# Patient Record
Sex: Female | Born: 1980 | Race: Black or African American | Hispanic: No | Marital: Single | Smoking: Former smoker
Health system: Southern US, Community
[De-identification: ages and names within clinical notes are randomized; demographics above are authoritative.]

## PROBLEM LIST (undated history)

## (undated) ENCOUNTER — Ambulatory Visit

## (undated) DIAGNOSIS — R0600 Dyspnea, unspecified: Secondary | ICD-10-CM

## (undated) DIAGNOSIS — F32A Depression, unspecified: Secondary | ICD-10-CM

## (undated) DIAGNOSIS — R87619 Unspecified abnormal cytological findings in specimens from cervix uteri: Secondary | ICD-10-CM

## (undated) DIAGNOSIS — F1910 Other psychoactive substance abuse, uncomplicated: Secondary | ICD-10-CM

## (undated) DIAGNOSIS — K219 Gastro-esophageal reflux disease without esophagitis: Secondary | ICD-10-CM

## (undated) DIAGNOSIS — M1990 Unspecified osteoarthritis, unspecified site: Secondary | ICD-10-CM

## (undated) DIAGNOSIS — E669 Obesity, unspecified: Secondary | ICD-10-CM

## (undated) DIAGNOSIS — D649 Anemia, unspecified: Secondary | ICD-10-CM

## (undated) DIAGNOSIS — F419 Anxiety disorder, unspecified: Secondary | ICD-10-CM

## (undated) DIAGNOSIS — IMO0002 Reserved for concepts with insufficient information to code with codable children: Secondary | ICD-10-CM

## (undated) HISTORY — PX: WISDOM TOOTH EXTRACTION: SHX21

## (undated) HISTORY — PX: TONSILLECTOMY AND ADENOIDECTOMY: SUR1326

## (undated) HISTORY — PX: TONSILLECTOMY: SUR1361

## (undated) HISTORY — PX: TONSILLECTOMY AND ADENOIDECTOMY: SHX28

## (undated) HISTORY — PX: FOOT SURGERY: SHX648

## (undated) HISTORY — DX: Reserved for concepts with insufficient information to code with codable children: IMO0002

## (undated) HISTORY — DX: Unspecified abnormal cytological findings in specimens from cervix uteri: R87.619

---
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
## 2000-05-11 ENCOUNTER — Encounter: Payer: Self-pay | Admitting: Emergency Medicine

## 2000-05-11 ENCOUNTER — Inpatient Hospital Stay (HOSPITAL_COMMUNITY): Admission: EM | Admit: 2000-05-11 | Discharge: 2000-05-12 | Payer: Self-pay | Admitting: Emergency Medicine

## 2000-05-22 ENCOUNTER — Encounter: Admission: RE | Admit: 2000-05-22 | Discharge: 2000-05-22 | Payer: Self-pay | Admitting: Hematology and Oncology

## 2000-11-01 ENCOUNTER — Emergency Department (HOSPITAL_COMMUNITY): Admission: EM | Admit: 2000-11-01 | Discharge: 2000-11-01 | Payer: Self-pay | Admitting: *Deleted

## 2001-09-07 ENCOUNTER — Emergency Department (HOSPITAL_COMMUNITY): Admission: EM | Admit: 2001-09-07 | Discharge: 2001-09-08 | Payer: Self-pay | Admitting: Emergency Medicine

## 2001-10-10 ENCOUNTER — Inpatient Hospital Stay (HOSPITAL_COMMUNITY): Admission: EM | Admit: 2001-10-10 | Discharge: 2001-10-12 | Payer: Self-pay | Admitting: Emergency Medicine

## 2001-10-10 ENCOUNTER — Encounter: Payer: Self-pay | Admitting: Emergency Medicine

## 2001-12-26 ENCOUNTER — Emergency Department (HOSPITAL_COMMUNITY): Admission: EM | Admit: 2001-12-26 | Discharge: 2001-12-26 | Payer: Self-pay | Admitting: Emergency Medicine

## 2003-05-04 ENCOUNTER — Encounter: Payer: Self-pay | Admitting: Emergency Medicine

## 2003-05-04 ENCOUNTER — Inpatient Hospital Stay (HOSPITAL_COMMUNITY): Admission: EM | Admit: 2003-05-04 | Discharge: 2003-05-05 | Payer: Self-pay | Admitting: Emergency Medicine

## 2003-08-22 ENCOUNTER — Emergency Department (HOSPITAL_COMMUNITY): Admission: EM | Admit: 2003-08-22 | Discharge: 2003-08-22 | Payer: Self-pay | Admitting: Emergency Medicine

## 2003-10-07 ENCOUNTER — Emergency Department (HOSPITAL_COMMUNITY): Admission: EM | Admit: 2003-10-07 | Discharge: 2003-10-07 | Payer: Self-pay | Admitting: Emergency Medicine

## 2004-01-23 ENCOUNTER — Other Ambulatory Visit: Admission: RE | Admit: 2004-01-23 | Discharge: 2004-01-23 | Payer: Self-pay | Admitting: Family Medicine

## 2004-01-25 ENCOUNTER — Emergency Department (HOSPITAL_COMMUNITY): Admission: EM | Admit: 2004-01-25 | Discharge: 2004-01-25 | Payer: Self-pay | Admitting: Emergency Medicine

## 2004-11-20 ENCOUNTER — Ambulatory Visit: Payer: Self-pay | Admitting: Family Medicine

## 2004-11-29 ENCOUNTER — Emergency Department (HOSPITAL_COMMUNITY): Admission: EM | Admit: 2004-11-29 | Discharge: 2004-11-29 | Payer: Self-pay | Admitting: Family Medicine

## 2005-01-10 ENCOUNTER — Inpatient Hospital Stay (HOSPITAL_COMMUNITY): Admission: RE | Admit: 2005-01-10 | Discharge: 2005-01-13 | Payer: Self-pay | Admitting: Psychiatry

## 2005-01-10 ENCOUNTER — Ambulatory Visit: Payer: Self-pay | Admitting: Psychiatry

## 2005-06-03 ENCOUNTER — Ambulatory Visit: Payer: Self-pay | Admitting: Certified Nurse Midwife

## 2005-06-03 ENCOUNTER — Inpatient Hospital Stay (HOSPITAL_COMMUNITY): Admission: AD | Admit: 2005-06-03 | Discharge: 2005-06-04 | Payer: Self-pay | Admitting: Obstetrics & Gynecology

## 2005-06-13 ENCOUNTER — Ambulatory Visit: Payer: Self-pay | Admitting: Certified Nurse Midwife

## 2005-06-13 ENCOUNTER — Inpatient Hospital Stay (HOSPITAL_COMMUNITY): Admission: AD | Admit: 2005-06-13 | Discharge: 2005-06-15 | Payer: Self-pay | Admitting: Family Medicine

## 2005-06-13 ENCOUNTER — Ambulatory Visit: Payer: Self-pay | Admitting: Family Medicine

## 2005-06-13 ENCOUNTER — Encounter (INDEPENDENT_AMBULATORY_CARE_PROVIDER_SITE_OTHER): Payer: Self-pay | Admitting: Specialist

## 2005-07-06 ENCOUNTER — Emergency Department (HOSPITAL_COMMUNITY): Admission: EM | Admit: 2005-07-06 | Discharge: 2005-07-06 | Payer: Self-pay | Admitting: Family Medicine

## 2006-11-20 ENCOUNTER — Emergency Department (HOSPITAL_COMMUNITY): Admission: EM | Admit: 2006-11-20 | Discharge: 2006-11-20 | Payer: Self-pay | Admitting: Family Medicine

## 2006-12-09 ENCOUNTER — Ambulatory Visit: Payer: Self-pay | Admitting: Family Medicine

## 2007-02-21 ENCOUNTER — Emergency Department (HOSPITAL_COMMUNITY): Admission: EM | Admit: 2007-02-21 | Discharge: 2007-02-21 | Payer: Self-pay | Admitting: Emergency Medicine

## 2007-05-24 ENCOUNTER — Emergency Department (HOSPITAL_COMMUNITY): Admission: EM | Admit: 2007-05-24 | Discharge: 2007-05-24 | Payer: Self-pay | Admitting: Emergency Medicine

## 2008-02-18 ENCOUNTER — Emergency Department (HOSPITAL_COMMUNITY): Admission: EM | Admit: 2008-02-18 | Discharge: 2008-02-18 | Payer: Self-pay | Admitting: Emergency Medicine

## 2008-04-12 ENCOUNTER — Ambulatory Visit: Payer: Self-pay | Admitting: Internal Medicine

## 2008-04-14 ENCOUNTER — Ambulatory Visit: Payer: Self-pay | Admitting: Internal Medicine

## 2008-04-22 ENCOUNTER — Encounter (INDEPENDENT_AMBULATORY_CARE_PROVIDER_SITE_OTHER): Payer: Self-pay | Admitting: Family Medicine

## 2008-04-22 ENCOUNTER — Ambulatory Visit: Payer: Self-pay | Admitting: Family Medicine

## 2008-04-22 LAB — CONVERTED CEMR LAB
Chlamydia, DNA Probe: NEGATIVE
GC Probe Amp, Genital: NEGATIVE

## 2008-05-23 ENCOUNTER — Emergency Department (HOSPITAL_COMMUNITY): Admission: EM | Admit: 2008-05-23 | Discharge: 2008-05-23 | Payer: Self-pay | Admitting: Family Medicine

## 2008-10-17 ENCOUNTER — Encounter (INDEPENDENT_AMBULATORY_CARE_PROVIDER_SITE_OTHER): Payer: Self-pay | Admitting: Family Medicine

## 2008-10-17 ENCOUNTER — Ambulatory Visit: Payer: Self-pay | Admitting: Internal Medicine

## 2008-10-17 LAB — CONVERTED CEMR LAB
ALT: 16 units/L (ref 0–35)
Albumin: 4.2 g/dL (ref 3.5–5.2)
Basophils Absolute: 0 10*3/uL (ref 0.0–0.1)
CO2: 22 meq/L (ref 19–32)
Calcium: 8.9 mg/dL (ref 8.4–10.5)
Chloride: 109 meq/L (ref 96–112)
Eosinophils Relative: 2 % (ref 0–5)
Glucose, Bld: 78 mg/dL (ref 70–99)
HCT: 40 % (ref 36.0–46.0)
Lymphocytes Relative: 33 % (ref 12–46)
Lymphs Abs: 2.3 10*3/uL (ref 0.7–4.0)
Neutro Abs: 4.2 10*3/uL (ref 1.7–7.7)
Platelets: 250 10*3/uL (ref 150–400)
Potassium: 3.8 meq/L (ref 3.5–5.3)
Sodium: 141 meq/L (ref 135–145)
Total Bilirubin: 0.6 mg/dL (ref 0.3–1.2)
Total Protein: 6.4 g/dL (ref 6.0–8.3)
WBC: 7.1 10*3/uL (ref 4.0–10.5)

## 2008-10-19 ENCOUNTER — Ambulatory Visit (HOSPITAL_COMMUNITY): Admission: RE | Admit: 2008-10-19 | Discharge: 2008-10-19 | Payer: Self-pay | Admitting: Family Medicine

## 2008-12-09 ENCOUNTER — Ambulatory Visit: Payer: Self-pay | Admitting: Family Medicine

## 2009-01-10 ENCOUNTER — Ambulatory Visit: Payer: Self-pay | Admitting: Family Medicine

## 2009-01-10 LAB — CONVERTED CEMR LAB
T3 Uptake Ratio: 35.4 % (ref 22.5–37.0)
TSH: 0.658 microintl units/mL (ref 0.350–4.50)

## 2009-03-02 ENCOUNTER — Ambulatory Visit: Payer: Self-pay | Admitting: Internal Medicine

## 2009-04-04 ENCOUNTER — Ambulatory Visit (HOSPITAL_COMMUNITY): Admission: RE | Admit: 2009-04-04 | Discharge: 2009-04-04 | Payer: Self-pay | Admitting: Family Medicine

## 2009-04-20 ENCOUNTER — Ambulatory Visit: Payer: Self-pay | Admitting: Family Medicine

## 2009-11-30 ENCOUNTER — Ambulatory Visit: Payer: Self-pay | Admitting: Family Medicine

## 2009-11-30 LAB — CONVERTED CEMR LAB
BUN: 12 mg/dL (ref 6–23)
Basophils Absolute: 0 10*3/uL (ref 0.0–0.1)
Chloride: 105 meq/L (ref 96–112)
Creatinine, Ser: 0.65 mg/dL (ref 0.40–1.20)
Eosinophils Absolute: 0.2 10*3/uL (ref 0.0–0.7)
Eosinophils Relative: 2 % (ref 0–5)
Free T4: 1.15 ng/dL (ref 0.80–1.80)
Glucose, Bld: 78 mg/dL (ref 70–99)
HCT: 40.1 % (ref 36.0–46.0)
Hemoglobin: 12.7 g/dL (ref 12.0–15.0)
Lymphocytes Relative: 38 % (ref 12–46)
MCV: 92 fL (ref 78.0–100.0)
Monocytes Absolute: 0.5 10*3/uL (ref 0.1–1.0)
Platelets: 261 10*3/uL (ref 150–400)
Potassium: 4.2 meq/L (ref 3.5–5.3)
RDW: 14.1 % (ref 11.5–15.5)
TSH: 1.414 microintl units/mL (ref 0.350–4.500)

## 2010-05-03 ENCOUNTER — Ambulatory Visit: Payer: Self-pay | Admitting: Family Medicine

## 2010-08-03 ENCOUNTER — Ambulatory Visit: Payer: Self-pay | Admitting: Family Medicine

## 2010-08-03 ENCOUNTER — Other Ambulatory Visit: Admission: RE | Admit: 2010-08-03 | Discharge: 2010-08-03 | Payer: Self-pay | Admitting: Family Medicine

## 2010-09-24 ENCOUNTER — Other Ambulatory Visit
Admission: RE | Admit: 2010-09-24 | Discharge: 2010-09-24 | Payer: Self-pay | Source: Home / Self Care | Admitting: Obstetrics and Gynecology

## 2010-09-24 ENCOUNTER — Ambulatory Visit: Payer: Self-pay | Admitting: Obstetrics and Gynecology

## 2010-10-15 ENCOUNTER — Ambulatory Visit: Payer: Self-pay | Admitting: Obstetrics & Gynecology

## 2011-01-02 ENCOUNTER — Emergency Department (HOSPITAL_COMMUNITY): Payer: Medicaid Other

## 2011-01-02 ENCOUNTER — Emergency Department (HOSPITAL_COMMUNITY)
Admission: EM | Admit: 2011-01-02 | Discharge: 2011-01-02 | Disposition: A | Discharge status: Home / Self Care | Payer: Medicaid Other | Attending: Emergency Medicine | Admitting: Emergency Medicine

## 2011-01-02 DIAGNOSIS — J45909 Unspecified asthma, uncomplicated: Secondary | ICD-10-CM | POA: Insufficient documentation

## 2011-01-02 DIAGNOSIS — R071 Chest pain on breathing: Secondary | ICD-10-CM | POA: Insufficient documentation

## 2011-01-02 DIAGNOSIS — R059 Cough, unspecified: Secondary | ICD-10-CM | POA: Insufficient documentation

## 2011-01-02 DIAGNOSIS — R0602 Shortness of breath: Secondary | ICD-10-CM | POA: Insufficient documentation

## 2011-01-02 DIAGNOSIS — R05 Cough: Secondary | ICD-10-CM | POA: Insufficient documentation

## 2011-01-29 LAB — POCT PREGNANCY, URINE: Preg Test, Ur: NEGATIVE

## 2011-04-05 NOTE — Discharge Summary (Signed)
NAME:  Melissa Guerra, Melissa Guerra                          ACCOUNT NO.:  [id number]   MEDICAL RECORD NO.:  [id number]                   PATIENT TYPE:  INP   LOCATION:  0456                                 FACILITY:  [facility]   PHYSICIAN:  Sherin Quarry, MD                   DATE OF BIRTH:  Jan 07, 1981   DATE OF ADMISSION:  05/04/2003  DATE OF DISCHARGE:  05/05/2003                                 DISCHARGE SUMMARY   HISTORY OF PRESENT ILLNESS:  Melissa Guerra is a 30 year old lady who has a  life-long history of asthma.  She was last hospitalized in November 2002.  The patient uses an albuterol inhaler on a p.r.n. basis, although she had  run out of this.  On the day prior to admission, she began to experience a  dry cough and sore throat.  By the night prior to admission, she was  experiencing extreme chest tightness, and had to sit up in a chair in order  to rest.  There have been no associated fevers or chest pain.  She vomited  twice.  She was admitted for treatment of this asthmatic exacerbation.  Of  note, is that the patient continues to smoke about 1/2 pack of cigarettes  per day.   PHYSICAL EXAMINATION:  HEENT:  Within normal limits.  CHEST:  Diffuse expiratory wheezes and scattered rhonchi.  CARDIOVASCULAR:  Normal S1 and S2, there were no murmurs, rubs, or gallops.  ABDOMEN:  Benign.  Normal bowel sounds.  There were no masses or tenderness.  No guarding or rebound.  NEUROLOGIC:  Normal.  EXTREMITIES:  Normal.   LABORATORY DATA:  Chest x-ray was consistent with finding of asthma, there  were no acute infiltrates.  BMET which showed a potassium of 2.1 initially.  Followup potassium later in the day was at 2.7.  The CBC was within normal  limits on admission.   HOSPITAL COURSE:  The patient was started on a regimen of Solu-Medrol 80 mg  IV q.12h., and an IV of D5 and 1/2 normal saline with 20 mEq of KCL at 125  cc per hour.  She was also given K-Dur 20 mEq t.i.d.  Nebulizer  treatments  with albuterol 2.5 mg were given q.4h.  She was placed on Zithromax and  Rocephin.  The patient's course was one of gradual improvement.  On May 05, 2003, although she continued to experience some wheezing and chest  congestion, it was felt that her problem could be managed with oral  treatment.  She indicated that she had Medicaid coverage and therefore would  be able to obtain medications.  Therefore, in the p.m. on May 05, 2003, the  patient was discharged.   DISCHARGE DIAGNOSES:  1. Acute exacerbation of asthma.  2. Chronic tobacco usage.  3. History of pneumonia.  4. Hypokalemia.   DISCHARGE MEDICATIONS:  1. Prior to discharge, the  patient's potassium was repeated and found to be     3.7.  The patient was given prednisone 40 mg daily x3, 30 mg daily x3, 20     x3, 10 x3, and then stop.  2. She was placed on albuterol meter dosed inhaler with Aerochamber 3 puffs     q.4h.  3. She was given potassium chloride 20 mEq daily.  4. She was placed on Zithromax 250 mg daily, and advised to take this     medication for five days.    FOLLOWUP:  She was advised to follow up with [facility] in 7 to 10 days.   The extreme importance of not smoking was repeatedly emphasized.                                               Sherin Quarry, MD    SY/MEDQ  D:  05/05/2003  T:  05/05/2003  Job:  [id number]   cc:   [facility]

## 2011-04-05 NOTE — Discharge Summary (Signed)
NAME:  Melissa Guerra, BACK                ACCOUNT NO.:  [id number]   MEDICAL RECORD NO.:  [id number]          PATIENT TYPE:  IPS   LOCATION:  0502                          [facility]ILITY[facility]  [facility]   PHYSICIAN:  Jeanice Lim, M.D. DATE OF BIRTH:  1981-05-17   DATE OF ADMISSION:  01/10/2005  DATE OF DISCHARGE:  01/13/2005                                 DISCHARGE SUMMARY   IDENTIFYING DATA:  30 year old single African-American female, voluntarily  admitted, presenting with a history of intentional overdose of Tylenol,  aspirin, Risperdal.  Reported overdose was stupid, regretting what she had  done, reported that she feared being evicted, unemployed, reported wanting  to go to sleep.  Considering terminating pregnancy.  [facility] admission, no prior suicide attempts.   ADMISSION MEDICATIONS:  None.   ALLERGIES:  CODEINE.   PHYSICAL EXAMINATION:  Within normal limits, neurologically nonfocal.   ROUTINE ADMISSION LABS:  Within normal limits.   MENTAL STATUS EXAM:  Alert, oriented, cooperative, fair eye contact.  Speech  clear, mood thankful that she did not die.  Mostly euthymic.  Thought  process goal directed, no psychosis or dangerous ideation, cognitively  intact.  Judgment and insight were fair.   ADMISSION DIAGNOSES:   AXIS I:  1.  Depressive disorder not otherwise specified.  2.  Rule out adjustment disorder with mixed emotions.   AXIS II:  Deferred.   AXIS III:  [redacted] weeks pregnant.   AXIS IV:  Moderate problems with housing, economic and other psychosocial  stressors.   AXIS V:  30/60-65.   HOSPITAL COURSE:  The patient was admitted and ordered routine p.r.n.  medications, underwent further monitoring, and was encouraged to participate  in individual, group and milieu therapy.  The patient was scheduled for  family session, participated in safety monitoring.  Family was contacted  regarding any safety concerns.  The patient reported improvement with  crisis  stabilization and resolution of depressive symptoms, feeling more hopeful,  having insight regarding the seriousness of her attempt and regretted,  showing remorse and was future oriented and was discharged with no risk  issues.  The patient was given medication education and discharged on:  Celexa 20 mg 1/2 q.a.m.   DISPOSITION:  To [facility] followup assessment and  for labs as well as psychiatric followup scheduled, and the patient was  discharged in improved condition with no risk issues.   DISCHARGE DIAGNOSES:   AXIS I:  1.  Depressive disorder not otherwise specified.  2.  Rule out adjustment disorder with mixed emotions.   AXIS II:  Deferred.   AXIS III:  [redacted] weeks pregnant.   AXIS IV:  Moderate problems with housing, economic and other psychosocial  stressors.   AXIS V:  Global assessment of function on discharge was 55-60.      JEM/MEDQ  D:  02/17/2005  T:  02/17/2005  Job:  [id number]

## 2011-04-05 NOTE — Discharge Summary (Signed)
[facility]  Patient:    Melissa Guerra, Melissa Guerra Visit Number: [id number] MRN: [id number]          Service Type: MED Location: [phone] Attending Physician:  Farley Ly Dictated by:   Jennette Kettle, M.D. Admit Date:  10/10/2001 Discharge Date: 10/12/2001   CC:         [address]   Discharge Summary  DISCHARGE DIAGNOSES: 1. Acute asthma exacerbation. 2. Upper respiratory tract infection. 3. History of tobacco use. 4. History of pneumonia in 2001.  DISCHARGE MEDICATIONS: 1. Albuterol 2.5 metered dose inhaler one to two puffs q.6h. p.r.n. asthma. 2. Flovent 44 mcg metered dose inhaler two puffs b.i.d. 3. Zithromax 250 mg p.o. q.d. to be taken for two more days, completing a    five-day course. 4. Claritin 10 mg p.o. q.d.  FOLLOW-UP:  The patient has a follow-up at [facility] on Friday, October 23, 2001, at 9:45 a.m.  Telephone number [phone].  ADMISSION HISTORY AND PHYSICAL:  Briefly, Melissa Guerra is a 30 year old African-American female with a lifelong history of asthma who had previously never been hospitalized.  The patient presents with a three-day history of shortness of breath and asthma.  The patient states that she was last seen at [facility] in March of 2002. The patient states that she has not been on her inhalers for the last month prior to admission.  The patient denied any fevers, chills, nightsweats, but did have two bouts of emesis the day before arrival.  The patient does smoke a half pack of cigarettes a day x 8 months now.  The patients peak flowmeters in the emergency room were 200 pretreatment and 250 post treatment with albuterol.  ADMISSION LABS:  The patient had a CBC on arrival with a white count of 7.4, hemoglobin 14.1 and platelets 299.  The patients electrolytes on admission were sodium 138, potassium 3.6, chloride 109, bicarb 22, glucose 127, BUN 11, creatinine 0.8, calcium 8.9.  HOSPITAL  COURSE:  #1 - ACUTE ASTHMA EXACERBATION:  The patient was admitted to the hospital and was placed on IV Solu-Medrol q.8h.  The patient was also started on q.3h. albuterol nebs and Atrovent nebs q.6h.  The patient was started on Azithromycin empirically for upper respiratory symptoms suggestive of bronchitis.  The patients peak flows improved while in the hospital to values of 350 pre- and post treatment.  The patient was discharged home on the day of October 12, 2001, with room air pulse oximetry readings 96 to 100%.  The patient was given Flovent and albuterol metered dose inhaler to take home. The patient was given a peak flowmeter to make home.  The patient will complete her two more days of Zithromax antibiotic therapy.  The patient will follow up with [facility] on October 23, 2001. The patient was advised to return to the emergency room or to [facility] triage if her asthma exacerbation returns.  #2 - TOBACCO USE:  The patient has an eight-month history of tobacco use. It was recommended to the patient that she stop smoking. The patient agrees to this plan.  #3 - SLIGHTLY ELEVATED GLUCOSE VALUES:  The patient was given steroids in the emergency room and was on IV Solu-Medrol which likely increased her glucose values.  The patient does have risk for diabetes in that she is African-American and is obese. The patient does have a family history of some diabetes as well.  This needs to be followed up further as an outpatient  at [facility].  DISCHARGE LABS:  The patient had an electrolyte panel drawn which revealed a sodium of 141, potassium 3.7, chloride 104, bicarb 24, glucose 138, BUN 9, creatinine 0.7, calcium 9.6. Dictated by:   Jennette Kettle, M.D. Attending Physician:  Farley Ly DD:  10/12/01 TD:  10/12/01 Job: [id number]

## 2011-04-22 ENCOUNTER — Ambulatory Visit: Payer: Self-pay | Admitting: Family Medicine

## 2011-05-06 ENCOUNTER — Ambulatory Visit: Payer: Self-pay | Admitting: Family Medicine

## 2011-05-16 ENCOUNTER — Ambulatory Visit (INDEPENDENT_AMBULATORY_CARE_PROVIDER_SITE_OTHER): Payer: Medicaid Other | Admitting: Obstetrics and Gynecology

## 2011-05-16 ENCOUNTER — Other Ambulatory Visit: Payer: Self-pay | Admitting: Obstetrics and Gynecology

## 2011-05-16 DIAGNOSIS — N870 Mild cervical dysplasia: Secondary | ICD-10-CM

## 2011-08-15 LAB — POCT URINALYSIS DIP (DEVICE)
Glucose, UA: NEGATIVE
Nitrite: NEGATIVE
Operator id: 235561
Urobilinogen, UA: 0.2

## 2011-08-15 LAB — POCT PREGNANCY, URINE: Preg Test, Ur: NEGATIVE

## 2011-09-03 ENCOUNTER — Other Ambulatory Visit (HOSPITAL_COMMUNITY)
Admission: RE | Admit: 2011-09-03 | Discharge: 2011-09-03 | Disposition: A | Discharge status: Home / Self Care | Payer: Medicaid Other | Source: Ambulatory Visit | Attending: Preventative Medicine | Admitting: Preventative Medicine

## 2011-09-03 ENCOUNTER — Other Ambulatory Visit: Payer: Self-pay | Admitting: Family Medicine

## 2011-09-03 DIAGNOSIS — Z01419 Encounter for gynecological examination (general) (routine) without abnormal findings: Secondary | ICD-10-CM | POA: Insufficient documentation

## 2011-09-03 LAB — POCT RAPID STREP A: Streptococcus, Group A Screen (Direct): NEGATIVE

## 2011-09-16 ENCOUNTER — Telehealth: Payer: Self-pay | Admitting: Obstetrics and Gynecology

## 2011-09-16 NOTE — Telephone Encounter (Signed)
Patient called with questions whether she should still come for her appointment in October 02, 2011 @ 1445 for a colposcopy. Per Dr. Shawnie Pons, yes, she does because her pap results are different from  from last year's biopsy result.  Patient agreed.

## 2011-10-02 ENCOUNTER — Encounter: Payer: Medicaid Other | Admitting: Physician Assistant

## 2011-11-02 ENCOUNTER — Encounter: Payer: Self-pay | Admitting: Emergency Medicine

## 2011-11-02 ENCOUNTER — Emergency Department (HOSPITAL_COMMUNITY)
Admission: EM | Admit: 2011-11-02 | Discharge: 2011-11-02 | Disposition: A | Discharge status: Home / Self Care | Payer: Medicaid Other | Source: Home / Self Care | Attending: Family Medicine | Admitting: Family Medicine

## 2011-11-02 DIAGNOSIS — J111 Influenza due to unidentified influenza virus with other respiratory manifestations: Secondary | ICD-10-CM

## 2011-11-02 LAB — POCT RAPID STREP A: Streptococcus, Group A Screen (Direct): NEGATIVE

## 2011-11-02 MED ORDER — MELOXICAM 7.5 MG PO TABS: 7.5000 mg | ORAL_TABLET | Freq: Every day | ORAL | Status: AC

## 2011-11-02 MED ORDER — BENZONATATE 200 MG PO CAPS: 200.0000 mg | ORAL_CAPSULE | Freq: Three times a day (TID) | ORAL | Status: AC | PRN

## 2011-11-02 NOTE — ED Notes (Signed)
Cough, runny nose, ears hurt, headache, eyes painful.  C/o nausea, no vomiting, no diarrhea.

## 2011-11-02 NOTE — ED Provider Notes (Signed)
History     CSN: [id number] Arrival date & time: 11/02/2011  4:34 PM   First MD Initiated Contact with Patient 11/02/11 1500      No chief complaint on file.   (Consider location/radiation/quality/duration/timing/severity/associated sxs/prior treatment) Patient is a 30 y.o. female presenting with flu symptoms. The history is provided by the patient.  Influenza This is a new problem. The current episode started more than 2 days ago. The problem occurs constantly. The problem has been gradually worsening. Associated symptoms include headaches and shortness of breath. Pertinent negatives include no chest pain and no abdominal pain. The symptoms are aggravated by exertion and coughing. The symptoms are relieved by nothing. The treatment provided no relief.    No past medical history on file.  No past surgical history on file.  No family history on file.  History  Substance Use Topics  . Smoking status: Not on file  . Smokeless tobacco: Not on file  . Alcohol Use: Not on file    OB History    No data available      Review of Systems  Constitutional: Positive for fever, chills, activity change and fatigue.  HENT: Positive for ear pain, congestion, sore throat, rhinorrhea, sneezing, neck pain and postnasal drip.   Eyes: Positive for pain.  Respiratory: Positive for cough and shortness of breath.   Cardiovascular: Negative for chest pain.  Gastrointestinal: Positive for nausea. Negative for abdominal pain.  Neurological: Positive for dizziness, weakness and headaches.    Allergies  Review of patient's allergies indicates not on file.  Home Medications  No current outpatient prescriptions on file.  BP 120/40  Pulse 77  Temp(Src) 98.6 F (37 C) (Oral)  Resp 18  SpO2 100%  Physical Exam  Constitutional: She is oriented to person, place, and time. She appears well-developed and well-nourished.       Obese black female  HENT:  Head: Normocephalic and atraumatic.    Right Ear: External ear normal.  Left Ear: External ear normal.  Eyes: Pupils are equal, round, and reactive to light.  Neck: Normal range of motion. Neck supple.  Cardiovascular: Normal rate, regular rhythm and normal heart sounds.   Pulmonary/Chest: Breath sounds normal. No respiratory distress. She has no wheezes.  Abdominal: Soft. Bowel sounds are normal. She exhibits no distension. There is no tenderness.  Musculoskeletal: Normal range of motion.  Lymphadenopathy:    She has cervical adenopathy.  Neurological: She is alert and oriented to person, place, and time.  Skin: Skin is warm.    ED Course  Procedures (including critical care time)  Labs Reviewed - No data to display No results found.   No diagnosis found.    MDM          Hassan Rowan, MD 11/02/11 [id number]

## 2011-11-21 ENCOUNTER — Encounter: Payer: Medicaid Other | Admitting: Family Medicine

## 2012-01-02 ENCOUNTER — Encounter: Payer: Self-pay | Admitting: Family Medicine

## 2012-01-02 ENCOUNTER — Other Ambulatory Visit (HOSPITAL_COMMUNITY)
Admission: RE | Admit: 2012-01-02 | Discharge: 2012-01-02 | Disposition: A | Discharge status: Home / Self Care | Payer: Medicaid Other | Source: Ambulatory Visit | Attending: Family Medicine | Admitting: Family Medicine

## 2012-01-02 ENCOUNTER — Ambulatory Visit (INDEPENDENT_AMBULATORY_CARE_PROVIDER_SITE_OTHER): Payer: Medicaid Other | Admitting: Family Medicine

## 2012-01-02 DIAGNOSIS — R87613 High grade squamous intraepithelial lesion on cytologic smear of cervix (HGSIL): Secondary | ICD-10-CM

## 2012-01-02 DIAGNOSIS — Z01812 Encounter for preprocedural laboratory examination: Secondary | ICD-10-CM

## 2012-01-02 DIAGNOSIS — R87619 Unspecified abnormal cytological findings in specimens from cervix uteri: Secondary | ICD-10-CM | POA: Insufficient documentation

## 2012-01-02 DIAGNOSIS — R85613 High grade squamous intraepithelial lesion on cytologic smear of anus (HGSIL): Secondary | ICD-10-CM

## 2012-01-02 DIAGNOSIS — N871 Moderate cervical dysplasia: Secondary | ICD-10-CM | POA: Insufficient documentation

## 2012-01-02 NOTE — Progress Notes (Signed)
Patient given informed consent, signed copy in the chart, time out was performed.  Placed in lithotomy position. Cervix viewed with speculum and colposcope after application of acetic acid.   Colposcopy adequate?  yes Acetowhite lesions?2, 7, 11 o'clock Punctation?no Mosaicism?  7, 11 o'clock Abnormal vasculature? no Biopsies?2, 7, 11 o'clock ECC?yes  Patient was given post procedure instructions.  She will return in 2 weeks for results.

## 2012-01-02 NOTE — Patient Instructions (Signed)
Colposcopy Care After Colposcopy is a procedure in which a special tool is used to magnify the surface of the cervix. A tissue sample (biopsy) may also be taken. This sample will be looked at for cervical cancer or other problems. After the test:  You may have some cramping.   Lie down for a few minutes if you feel lightheaded.    You may have some bleeding which should stop in a few days.  HOME CARE  Do not have sex or use tampons for 2 to 3 days or as told.   Only take medicine as told by your doctor.   Continue to take your birth control pills as usual.  Finding out the results of your test Ask when your test results will be ready. Make sure you get your test results. GET HELP RIGHT AWAY IF:  You are bleeding a lot or are passing blood clots.   You develop a fever of 102 F (38.9 C) or higher.   You have abnormal vaginal discharge.   You have cramps that do not go away with medicine.   You feel lightheaded, dizzy, or pass out (faint).  MAKE SURE YOU:   Understand these instructions.   Will watch your condition.   Will get help right away if you are not doing well or get worse.  Document Released: 04/22/2008 Document Revised: 07/17/2011 Document Reviewed: 04/22/2008 ExitCare Patient Information 2012 [employer]. 

## 2012-01-29 ENCOUNTER — Ambulatory Visit (INDEPENDENT_AMBULATORY_CARE_PROVIDER_SITE_OTHER): Payer: Medicaid Other | Admitting: Family Medicine

## 2012-01-29 ENCOUNTER — Encounter: Payer: Self-pay | Admitting: Family Medicine

## 2012-01-29 VITALS — BP 123/73 | HR 94 | Temp 98.0°F | Wt 265.9 lb

## 2012-01-29 DIAGNOSIS — N871 Moderate cervical dysplasia: Secondary | ICD-10-CM

## 2012-01-29 NOTE — Patient Instructions (Signed)
Loop Electrosurgical Excision Procedure Loop electrosurgical excision procedure (LEEP) is the removal of a portion of the lower part of the uterus (cervix). The procedure is done when there are significantly abnormal cervical cell changes. Abnormal cell changes of the cervix can lead to cancer if left in place and untreated.  The LEEP procedure itself typically only takes a few minutes. Often, it may be done in your caregiver's office. The procedure is considered safe for those who wish to get pregnant or are trying to get pregnant. Only under rare circumstances should this procedure be done if you are pregnant. LET YOUR CAREGIVER KNOW ABOUT:  Whether you are pregnant or late for your last menstrual period.   Allergies to foods or medicines.   All the medicines you are taking includingherbs, eyedrops, and over-the-counter medicines, and creams.   Use of steroids (by mouth or creams).   Previous problems with anesthetics or numbing medicine.   Previous gynecological surgery.   History of blood clots or bleeding problems.   Any recent or current vaginal infections (herpes, sexually transmitted infections).   Other health problems.  RISKS AND COMPLICATIONS  Bleeding.   Infection.   Injury to the vagina, bladder, or rectum.   Very rare obstruction of the cervical opening that causes problems during menstruation (cervical stenosis).  BEFORE THE PROCEDURE  Do not take aspirin or blood thinners (anticoagulants) for 1 week before the procedure, or as told by your caregiver.   Eat a light meal before the procedure.   Ask your caregiver about changing or stopping your regular medicines.   You may be given a pain reliever 1 or 2 hours before the procedure.  PROCEDURE   A tool (speculum) is placed in the vagina. This allows your caregiver to see the cervix.   An iodine stain is applied to the cervix to find the area of abnormal cells to be removed.   Medicine is injected to numb  the cervix (local anesthetic).    Electricity is passed through a thin wire loop which is then used to remove (cauterize) a small segment of the affected cervix.   Light electrocautery is used to seal any small blood vessels and prevent bleeding.   A paste may be applied to the cauterized area of the cervix to help prevent bleeding.   The tissue sample is sent to the lab. It is examined under the microscope.  AFTER THE PROCEDURE  Have someone drive you home.   You may have slight to moderate cramping.   You may notice a black vaginal discharge from the paste used on the cervix to prevent bleeding. This is normal.   Watch for excessive bleeding. This requires immediate medical care.   Ask when your test results will be ready. Make sure you get your test results.  Document Released: 01/25/2003 Document Revised: 10/24/2011 Document Reviewed: 04/16/2011 ExitCare Patient Information 2012 [employer]. 

## 2012-01-29 NOTE — Progress Notes (Signed)
  Subjective:    Patient ID: Melissa Guerra, female    DOB: 1981/02/13, 31 y.o.   MRN: [id number]  HPI Patient seen for follow up colposcopy and LEEP eval.  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    Review of Systems  Constitutional: Negative for fever and fatigue.  Gastrointestinal: Negative for abdominal pain.  Genitourinary: Negative for dysuria, vaginal bleeding, vaginal discharge and vaginal pain.       Objective:   Physical Exam  Constitutional: She is oriented to person, place, and time. She appears well-developed and well-nourished.  Neurological: She is alert and oriented to person, place, and time.  Skin: Skin is warm and dry.  Psychiatric: She has a normal mood and affect. Her behavior is normal. Judgment and thought content normal.      Assessment & Plan:  1.  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 Discussed results.  Recommended LEEP.  Discussed LEEP procedure in detail with risks vs benefits.  Will have patient watch LEEP video.  Will schedule next available LEEP.

## 2012-02-05 ENCOUNTER — Emergency Department (INDEPENDENT_AMBULATORY_CARE_PROVIDER_SITE_OTHER)
Admission: EM | Admit: 2012-02-05 | Discharge: 2012-02-05 | Disposition: A | Discharge status: Home / Self Care | Payer: Medicaid Other | Source: Home / Self Care | Attending: Family Medicine | Admitting: Family Medicine

## 2012-02-05 ENCOUNTER — Encounter (HOSPITAL_COMMUNITY): Payer: Self-pay

## 2012-02-05 DIAGNOSIS — T148 Other injury of unspecified body region: Secondary | ICD-10-CM

## 2012-02-05 DIAGNOSIS — T148XXA Other injury of unspecified body region, initial encounter: Secondary | ICD-10-CM

## 2012-02-05 DIAGNOSIS — W57XXXA Bitten or stung by nonvenomous insect and other nonvenomous arthropods, initial encounter: Secondary | ICD-10-CM

## 2012-02-05 MED ORDER — TRIAMCINOLONE ACETONIDE 0.1 % EX CREA: TOPICAL_CREAM | Freq: Two times a day (BID) | CUTANEOUS | Status: DC

## 2012-02-05 NOTE — ED Provider Notes (Signed)
Medical screening examination/treatment/procedure(s) were performed by non-physician practitioner and as supervising physician I was immediately available for consultation/collaboration.   MORENO-COLL,Dolorez Jeffrey; MD   Jaylise Peek Moreno-Coll, MD 02/05/12 2154 

## 2012-02-05 NOTE — ED Notes (Signed)
Patient in same sex relationship, not concerned about pregnancy

## 2012-02-05 NOTE — Discharge Instructions (Signed)
Insect Bite Mosquitoes, flies, fleas, bedbugs, and other insects can bite. Insect bites are different from insect stings. The bite may be red, puffy (swollen), and itchy for 2 to 4 days. Most bites get better on their own. HOME CARE   Do not scratch the bite.   Keep the bite clean and dry. Wash the bite with soap and water.   Put ice on the bite.   Put ice in a plastic bag.   Place a towel between your skin and the bag.   Leave the ice on for 20 minutes, 4 times a day. Do this for the first 2 to 3 days, or as told by your doctor.   You may use medicated lotions or creams to lessen itching as told by your doctor.   Only take medicines as told by your doctor.   If you are given medicines (antibiotics), take them as told. Finish them even if you start to feel better.  You may need a tetanus shot if:  You cannot remember when you had your last tetanus shot.   You have never had a tetanus shot.   The injury broke your skin.  If you need a tetanus shot and you choose not to have one, you may get tetanus. Sickness from tetanus can be serious. GET HELP RIGHT AWAY IF:   You have more pain, redness, or puffiness.   You see a red line on the skin coming from the bite.   You have a fever.   You have joint pain.   You have a headache or neck pain.   You feel weak.   You have a rash.   You have chest pain, or you are short of breath.   You have belly (abdominal) pain.   You feel sick to your stomach (nauseous) or throw up (vomit).   You feel very tired or sleepy.  MAKE SURE YOU:   Understand these instructions.   Will watch your condition.   Will get help right away if you are not doing well or get worse.  Document Released: 11/01/2000 Document Revised: 10/24/2011 Document Reviewed: 06/05/2011 ExitCare Patient Information 2012 [employer]. 

## 2012-02-05 NOTE — ED Notes (Signed)
Concerned about 3 areas on body that may be "bites'; patient has circled with ink, area on her let and right arms, and on breast

## 2012-02-05 NOTE — ED Provider Notes (Signed)
History     CSN: [id number]  Arrival date & time 02/05/12  1729   First MD Initiated Contact with Patient 02/05/12 1756      Chief Complaint  Patient presents with  . Rash    (Consider location/radiation/quality/duration/timing/severity/associated sxs/prior treatment) HPI Comments: Patient presents today with 3 red areas that she is concerned may be spider bites. She noticed these yesterday after she did some household cleaning and hanging some laundry out online to dry. She denies recently being in the woods or weeds. She states the areas are itchy and are not painful.   Past Medical History  Diagnosis Date  . Asthma   . Abnormal Pap smear     Past Surgical History  Procedure Date  . Tonsillectomy   . Tonsillectomy and adenoidectomy   . Foot surgery   . Tonsillectomy and adenoidectomy     History reviewed. No pertinent family history.  History  Substance Use Topics  . Smoking status: Former Smoker    Types: Cigarettes  . Smokeless tobacco: Never Used  . Alcohol Use: No    OB History    Grav Para Term Preterm Abortions TAB SAB Ect Mult Living   2 2              Review of Systems  Constitutional: Negative for fever and chills.  Musculoskeletal: Negative for myalgias and joint swelling.  Skin: Positive for rash.    Allergies  Codeine; Food; and Peanut-containing drug products  Home Medications   Current Outpatient Rx  Name Route Sig Dispense Refill  . ALBUTEROL SULFATE HFA IN Inhalation Inhale into the lungs.      [place] [employer] IN Inhalation Inhale into the lungs.      . CETIRIZINE HCL 10 MG PO TABS Oral Take 10 mg by mouth daily.      Marland Kitchen FLUTICASONE PROPIONATE 50 MCG/ACT NA SUSP Nasal Place 2 sprays into the nose daily.      . MELOXICAM 7.5 MG PO TABS Oral Take 1 tablet (7.5 mg total) by mouth daily. 30 tablet 2  . OMEPRAZOLE 10 MG PO CPDR Oral Take 10 mg by mouth 2 (two) times daily.    Marland Kitchen OVER THE COUNTER MEDICATION  Cough drop, cold medicine, pain med      . TRIAMCINOLONE ACETONIDE 0.1 % EX CREA Topical Apply topically 2 (two) times daily. 15 g 0    BP 168/77  Pulse 96  Temp(Src) 98.1 F (36.7 C) (Oral)  Resp 14  SpO2 100%  LMP 01/16/2012  Physical Exam  Nursing note and vitals reviewed. Constitutional: She appears well-developed and well-nourished. No distress.  Cardiovascular: Normal rate, regular rhythm and normal heart sounds.   Pulmonary/Chest: Effort normal and breath sounds normal. No respiratory distress.  Skin: Skin is warm and dry.       Dorsal aspect of the left forearm has a 6 x 7 cm erythematous macular area with central scabbed puncture site.. It is warm to touch. Right medial wrist 3 x 5 cm erythematous irregular macular area also with a central scabbed puncture site. Left superior chest 4 x 4 centimeter erythematous macular area, slightly indurated, without puncture site or scab.  Psychiatric: She has a normal mood and affect.    ED Course  Procedures (including critical care time)  Labs Reviewed - No data to display No results found.   1. Insect bites       MDM          Melody Comas,  PA 02/05/12 1908

## 2012-02-24 ENCOUNTER — Encounter: Payer: Medicaid Other | Admitting: Family Medicine

## 2012-03-19 ENCOUNTER — Encounter: Payer: Medicaid Other | Admitting: Family Medicine

## 2012-04-27 ENCOUNTER — Encounter: Payer: Self-pay | Admitting: Physician Assistant

## 2012-04-27 ENCOUNTER — Other Ambulatory Visit (HOSPITAL_COMMUNITY)
Admission: RE | Admit: 2012-04-27 | Discharge: 2012-04-27 | Disposition: A | Discharge status: Home / Self Care | Payer: Medicaid Other | Source: Ambulatory Visit | Attending: Family Medicine | Admitting: Family Medicine

## 2012-04-27 ENCOUNTER — Encounter: Payer: Self-pay | Admitting: Family Medicine

## 2012-04-27 ENCOUNTER — Ambulatory Visit (INDEPENDENT_AMBULATORY_CARE_PROVIDER_SITE_OTHER): Payer: Medicaid Other | Admitting: Family Medicine

## 2012-04-27 VITALS — BP 126/82 | HR 82 | Temp 99.0°F | Ht 64.0 in | Wt 261.4 lb

## 2012-04-27 DIAGNOSIS — D069 Carcinoma in situ of cervix, unspecified: Secondary | ICD-10-CM | POA: Insufficient documentation

## 2012-04-27 DIAGNOSIS — Z01812 Encounter for preprocedural laboratory examination: Secondary | ICD-10-CM

## 2012-04-27 DIAGNOSIS — N871 Moderate cervical dysplasia: Secondary | ICD-10-CM

## 2012-04-27 LAB — POCT PREGNANCY, URINE: Preg Test, Ur: NEGATIVE

## 2012-04-27 NOTE — Patient Instructions (Signed)
Loop Electrosurgical Excision Procedure Loop electrosurgical excision procedure (LEEP) is the removal of a portion of the lower part of the uterus (cervix). The procedure is done when there are significantly abnormal cervical cell changes. Abnormal cell changes of the cervix can lead to cancer if left in place and untreated.  The LEEP procedure itself typically only takes a few minutes. Often, it may be done in your caregiver's office. The procedure is considered safe for those who wish to get pregnant or are trying to get pregnant. Only under rare circumstances should this procedure be done if you are pregnant. LET YOUR CAREGIVER KNOW ABOUT:  Whether you are pregnant or late for your last menstrual period.   Allergies to foods or medicines.   All the medicines you are taking includingherbs, eyedrops, and over-the-counter medicines, and creams.   Use of steroids (by mouth or creams).   Previous problems with anesthetics or numbing medicine.   Previous gynecological surgery.   History of blood clots or bleeding problems.   Any recent or current vaginal infections (herpes, sexually transmitted infections).   Other health problems.  RISKS AND COMPLICATIONS  Bleeding.   Infection.   Injury to the vagina, bladder, or rectum.   Very rare obstruction of the cervical opening that causes problems during menstruation (cervical stenosis).  BEFORE THE PROCEDURE  Do not take aspirin or blood thinners (anticoagulants) for 1 week before the procedure, or as told by your caregiver.   Eat a light meal before the procedure.   Ask your caregiver about changing or stopping your regular medicines.   You may be given a pain reliever 1 or 2 hours before the procedure.  PROCEDURE   A tool (speculum) is placed in the vagina. This allows your caregiver to see the cervix.   An iodine stain is applied to the cervix to find the area of abnormal cells to be removed.   Medicine is injected to numb  the cervix (local anesthetic).    Electricity is passed through a thin wire loop which is then used to remove (cauterize) a small segment of the affected cervix.   Light electrocautery is used to seal any small blood vessels and prevent bleeding.   A paste may be applied to the cauterized area of the cervix to help prevent bleeding.   The tissue sample is sent to the lab. It is examined under the microscope.  AFTER THE PROCEDURE  Have someone drive you home.   You may have slight to moderate cramping.   You may notice a black vaginal discharge from the paste used on the cervix to prevent bleeding. This is normal.   Watch for excessive bleeding. This requires immediate medical care.   Ask when your test results will be ready. Make sure you get your test results.  Document Released: 01/25/2003 Document Revised: 10/24/2011 Document Reviewed: 04/16/2011 ExitCare Patient Information 2012 [employer]. 

## 2012-04-27 NOTE — Progress Notes (Signed)
Patient ID: Melissa Guerra, female   DOB: 23-Jan-1981, 31 y.o.   MRN: [id number]   LEEP PROCEDURE NOTE Pap smear and colposcopy reviewed.   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  Role of HPV,cervical dysplasia and need for close followup was empasized. Informed written consent was obtained. All questions were answered. Time out performed.  Procedure: The patient was placed in lithotomy position and the bivalved coated speculum was placed in the patient's vagina. A grounding pad placed on the patient. Lugol's solution was applied to the cervix and areas of decreased uptake were noted 11-2 o'clock.   Local anesthesia was administered via an intracervical block using 10cc of 2% Lidocaine with epinephrine. The suction was turned on and the Medium 1X Fisher Cone Biopsy Excisor on 50 Watts of cutting current was used to excise the area of decreased uptake and excise the entire transformation zone. Excellent hemostasis was achieved using roller ball coagulation set at 50 Watts coagulation current. Monsel's solution was then applied and the speculum was removed from the vagina. Specimens were sent to pathology. The patient tolerated the procedure well. Post-operative instructions given to patient, including instruction to seek medical attention for persistent bright red bleeding, fever, abdominal/pelvic pain, dysuria, nausea or vomiting. She was also told about the possibility of having copious yellow to black tinged discharge. She was counseled to avoid anything in the vagina (sex/douching/tampons) for 4-6 weeks. She has a  4 week post-operative check to review results and assess wound healing. Follow up in 6 months for repeat pap or as needed.

## 2012-05-06 ENCOUNTER — Telehealth: Payer: Self-pay | Admitting: *Deleted

## 2012-05-06 NOTE — Telephone Encounter (Signed)
Melissa Guerra called and left a message stating she had a LEEP last Monday and her question is that she knows she is supposed to have a mustard like discharge but is still having discharge running out that is yellowish, vinegarish and wants to know is it normal?

## 2012-05-06 NOTE — Telephone Encounter (Signed)
Called Azani back and left a voice mail message that we are returning your call, sorry that we have missed you - will call back again and you may call us again.

## 2012-05-06 NOTE — Telephone Encounter (Signed)
Called home number and left message also that we are returing her call and please call clinic back.

## 2012-05-14 NOTE — Telephone Encounter (Signed)
Called Qiana, she states since she left the message her cycle came on and is now just about finished, is having just whitish discharge, less than before. Discussed is normal to have a discharge for few weeks after LEEP, if has unusual discharge( green, etc) or fever or pain - call clinic or if over weekend go [facility]. Patient aware has followup appt 05/25/12.

## 2012-05-25 ENCOUNTER — Encounter: Payer: Self-pay | Admitting: Obstetrics & Gynecology

## 2012-05-25 ENCOUNTER — Ambulatory Visit (INDEPENDENT_AMBULATORY_CARE_PROVIDER_SITE_OTHER): Payer: Medicaid Other | Admitting: Obstetrics & Gynecology

## 2012-05-25 VITALS — BP 118/70 | HR 85 | Temp 98.2°F | Ht 64.0 in | Wt 264.5 lb

## 2012-05-25 DIAGNOSIS — N898 Other specified noninflammatory disorders of vagina: Secondary | ICD-10-CM

## 2012-05-25 NOTE — Patient Instructions (Signed)
Cervical Dysplasia Cervical dysplasia is a condition in which a woman has abnormal changes in the cells of her cervix. The cervix is the opening to the uterus (womb) between the vagina and the uterus. These changes are called cervical dysplasia and may be the first signs of cervical cancer. These cells can be taken from the cervix during a Pap test and then looked at under a microscope. With early detection, treatment, and close follow-up care, nearly all cervical dysplasia can be cured. If untreated, the mild to moderate stages of dysplasia often grow more severe.  RISK FACTORS  The following increase the risk for cervical dysplasia.  Having had a sexually transmitted disease, including:   Chlamydia.   Human papilloma virus (HPV).   Becoming sexually active before age 18.   Having had more than 1 sexual partner.   Not using protection, such as condoms, during sexual intercourse, especially with new sexual partners.   Having had cancer of the vagina or vulva.   Having a sexual partner whose previous partner had cancer of the cervix or cervical dysplasia.   Having a sexual partner who has or has had cancer of the penis.   Having a weakened immune system (HIV, organ transplant).   Being the daughter of a woman who took DES (diethylstilbestrol) during pregnancy.   A history of cervical cancer in a woman's sister or mother.   Smoking.   Having had an abnormal Pap test in the past.  SYMPTOMS  There are usually no symptoms. If there are symptoms, they may be vague such as:  Abnormal vaginal discharge.   Bleeding between periods or following intercourse.   Bleeding during menopause.   Pain on intercourse (dyspareunia).  DIAGNOSIS   The Pap test is the best way of detecting abnormalities of the cervix.   Biopsy (removing a piece of tissue to look at under the microscope) of the cervix when the Pap test is abnormal or when the Pap test is normal, but the cervix looks abnormal.    TREATMENT  Catching and treating the changes early with Pap tests can prevent cervical cancer.  Cryotherapy freezes the abnormal cells with a steel tip instrument.   A laser can be used to remove the abnormal cells.   Loop electrocautery excision procedure (LEEP). This procedure uses a heated electrical loop to remove a cone-like portion of the cervix, including the cervical canal.   For more serious cases of cervical dysplasia, the abnormal tissue may be removed surgically by:   A cone biopsy (by cold knife, laser or LEEP). A procedure in which a portion of the center of the cervix with the cervical canal is removed.   The uterus and cervix are removed (hysterectomy).  Your caregiver will advise you regarding the need and timing of Pap tests in your follow-up. Women who have been treated for dysplasia should be closely followed with pelvic exams and Pap tests. During the first year following treatment of cervical dysplasia, Pap tests should be done every 3 to 4 months. In the second year, the schedule is every 6 months, or as recommended by your caregiver. See your caregiver for new or worsening problems. HOME CARE INSTRUCTIONS   Follow the instructions and recommendations of your caregiver regarding medicines and follow-up appointments.   Only take over-the-counter or prescription medicines for pain or discomfort as directed by your caregiver.   Cramping and pelvic discomfort may follow cryotherapy. It is not abnormal to have watery discharge for several weeks after.     Laser, cone surgery, cryotherapy or LEEP can cause a bad smelling vaginal discharge. It may also cause vaginal bleeding for a couple weeks following the procedure. The discharge may be black from the paste used to control bleeding from the cone site. This is normal.   Do not use tampons, have sexual intercourse or douche until your caregiver says it is okay.  SEEK MEDICAL CARE IF:   You develop genital warts.   You  need a prescription for pain medicine following your treatment.  SEEK IMMEDIATE MEDICAL CARE IF:   Your bleeding is heavier than a normal menstrual period.   You develop bright red bleeding, especially if you have blood clots.   You have a fever.   You have increasing cramps or pain not relieved with medicine.   You are lightheaded, unusually weak, or have fainting spells.   You have abnormal vaginal discharge.   You develop abdominal pain.  PREVENTION   The surest way to prevent cervical dysplasia is to abstain from sexual intercourse.   Practice safe sex, use condoms and have only one sex partner who does not have other sex partners.   A Pap test is done to screen for cervical cancer.   The first Pap test should be done at age 21.   Between ages 21 and 29, Pap tests are repeated every 2 years.   Beginning at age 30, you are advised to have a Pap test every 3 years as long as your past 3 Pap tests have been normal.   Some women have medical problems that increase the chance of getting cervical cancer. Talk to your caregiver about these problems. It is especially important to talk to your caregiver if a new problem develops soon after your last Pap test. In these cases, your caregiver may recommend more frequent screening and Pap tests.   The above recommendations are the same for women who have or have not gotten the vaccine for HPV (Human Papillomavirus).   If you had a hysterectomy for a problem that was not a cancer or a condition that could lead to cancer, then you no longer need Pap tests. However, even if you no longer need a Pap test, a regular exam is a good idea to make sure no other problems are starting.    If you are between ages 65 and 70, and you have had normal Pap tests going back 10 years, you no longer need Pap tests. However, even if you no longer need a Pap test, a regular exam is a good idea to make sure no other problems are starting.    If you have  had past treatment for cervical cancer or a condition that could lead to cancer, you need Pap tests and screening for cancer for at least 20 years after your treatment.   If Pap tests have been discontinued, risk factors (such as a new sexual partner) need to be re-assessed to determine if screening should be resumed.   Some women may need screenings more often if they are at high risk for cervical cancer.   Your caregiver may do additional tests including:   Colposcopy. A procedure in which a special microscope magnifies the cells and allows the provider to closely examine the cervix, vagina, and vulva.   Biopsy. A small tissue sample is taken from the cervix, vagina or vulva. This is generally done in your caregivers office.   A cone biopsy (cold knife or laser). A large tissue sample is   taken from the cervix. This procedure is usually done in an operating room under a general anesthetic. The cone often removes all abnormal tissue and so may also complete the treatment.   LEEP, also removing a circular portion of the cervix and is done in a doctors office under a local anesthetic.   Now there is a vaccine, Gardasil, that was developed to prevent the HPV'S that can cause cancer of the cervix and genital warts. It is recommended for females ages 9 to 26. It should not be given to pregnant women until more is known about its effects on the fetus. Not all cancers of the cervix are caused by the HPV. Routine gynecology exams and Pap tests should continue as recommended by your caregiver.  Document Released: 11/04/2005 Document Revised: 10/24/2011 Document Reviewed: 10/26/2008 [employer] Patient Information 2012 [employer]. 

## 2012-05-25 NOTE — Progress Notes (Signed)
Subjective:     Patient ID: Melissa Guerra, female   DOB: [birth year], 31 y.o.   MRN: [id number]  HPIPatient's last menstrual period was 05/03/2012. G2P2 Patient had a LEEP performed by Dr. Adrian Blackwater on 04/23/2012. 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 She notes a vaginal discharge which may be getting better now. No abnormal bleeding.   Review of Systems Yellowish vaginal discharge no pain    Objective:   Physical Exam Filed Vitals:   05/25/12 1413  BP: 118/70  Pulse: 85  Temp: 98.2 F (36.8 C)  TempSrc: Oral  Height: 5\' 4"  (1.626 m)  Weight: 264 lb 8 oz (119.976 kg)   No acute distress normal affect    Pelvic external genitalia vagina and cervix appeared normal slight vaginal discharge was noted. Cervix is healing well after the procedure. Wet prep was obtained. 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    Plan:     We'll followup on a wet prep results and notify or if treatment is indicated. She'll return in 6 months for repeat Pap smear.  Melissa Guerra 05/25/2012 2:55 PM

## 2012-05-26 ENCOUNTER — Telehealth: Payer: Self-pay | Admitting: *Deleted

## 2012-05-26 NOTE — Telephone Encounter (Signed)
Message copied by Jill Side on Tue May 26, 2012  3:54 PM ------      Message from: Adam Phenix      Created: Tue May 26, 2012 10:39 AM       Negative wet prep for infection, please inform patient

## 2012-05-26 NOTE — Telephone Encounter (Signed)
Called pt and left message that her results from yesterday were normal. She does not have any infection which requires treatment. Please call back if she has any questions.

## 2012-12-24 ENCOUNTER — Emergency Department (HOSPITAL_COMMUNITY)
Admission: EM | Admit: 2012-12-24 | Discharge: 2012-12-24 | Disposition: A | Discharge status: Home / Self Care | Payer: Medicaid Other | Attending: Emergency Medicine | Admitting: Emergency Medicine

## 2012-12-24 ENCOUNTER — Encounter (HOSPITAL_COMMUNITY): Payer: Self-pay | Admitting: Emergency Medicine

## 2012-12-24 ENCOUNTER — Emergency Department (HOSPITAL_COMMUNITY): Payer: Medicaid Other

## 2012-12-24 DIAGNOSIS — R197 Diarrhea, unspecified: Secondary | ICD-10-CM | POA: Insufficient documentation

## 2012-12-24 DIAGNOSIS — Z87891 Personal history of nicotine dependence: Secondary | ICD-10-CM | POA: Insufficient documentation

## 2012-12-24 DIAGNOSIS — Z79899 Other long term (current) drug therapy: Secondary | ICD-10-CM | POA: Insufficient documentation

## 2012-12-24 DIAGNOSIS — R87619 Unspecified abnormal cytological findings in specimens from cervix uteri: Secondary | ICD-10-CM | POA: Insufficient documentation

## 2012-12-24 DIAGNOSIS — IMO0001 Reserved for inherently not codable concepts without codable children: Secondary | ICD-10-CM | POA: Insufficient documentation

## 2012-12-24 DIAGNOSIS — R05 Cough: Secondary | ICD-10-CM | POA: Insufficient documentation

## 2012-12-24 DIAGNOSIS — J45909 Unspecified asthma, uncomplicated: Secondary | ICD-10-CM | POA: Insufficient documentation

## 2012-12-24 DIAGNOSIS — R1110 Vomiting, unspecified: Secondary | ICD-10-CM | POA: Insufficient documentation

## 2012-12-24 DIAGNOSIS — R6883 Chills (without fever): Secondary | ICD-10-CM | POA: Insufficient documentation

## 2012-12-24 DIAGNOSIS — Z2089 Contact with and (suspected) exposure to other communicable diseases: Secondary | ICD-10-CM | POA: Insufficient documentation

## 2012-12-24 DIAGNOSIS — J029 Acute pharyngitis, unspecified: Secondary | ICD-10-CM | POA: Insufficient documentation

## 2012-12-24 DIAGNOSIS — J111 Influenza due to unidentified influenza virus with other respiratory manifestations: Secondary | ICD-10-CM

## 2012-12-24 DIAGNOSIS — R059 Cough, unspecified: Secondary | ICD-10-CM | POA: Insufficient documentation

## 2012-12-24 LAB — RAPID STREP SCREEN (MED CTR MEBANE ONLY): Streptococcus, Group A Screen (Direct): NEGATIVE

## 2012-12-24 MED ORDER — DEXAMETHASONE 6 MG PO TABS
12.0000 mg | ORAL_TABLET | Freq: Once | ORAL | Status: AC
  Administered 2012-12-24: 12 mg via ORAL
  Filled 2012-12-24: qty 2

## 2012-12-24 MED ORDER — BUDESONIDE-FORMOTEROL FUMARATE 80-4.5 MCG/ACT IN AERO: 2.0000 | INHALATION_SPRAY | Freq: Two times a day (BID) | RESPIRATORY_TRACT | Status: DC

## 2012-12-24 NOTE — ED Provider Notes (Signed)
History     CSN: [id number]  Arrival date & time 12/24/12  1016   First MD Initiated Contact with Patient 12/24/12 1020      No chief complaint on file.   (Consider location/radiation/quality/duration/timing/severity/associated sxs/prior treatment) The history is provided by the patient.   32 year old female has been sick for the last 3 days. She started with vomiting and diarrhea which resolved after 1 day. She then developed sore throat with associated cough productive of clear sputum which is occasionally blood-streaked. She denies fever but has had chills and sweats. She has had generalized bodyaches and she has some soreness in her chest which she relates to coughing. Pain is moderate to severe and she rates it at 7/10. She did have a sick contact in that a family member had complained of a sore throat several days before she started getting sick. She has been using her albuterol nebulizer with partial relief of her cough.  Past Medical History  Diagnosis Date  . Asthma   . Abnormal Pap smear     Past Surgical History  Procedure Date  . Tonsillectomy   . Tonsillectomy and adenoidectomy   . Foot surgery   . Tonsillectomy and adenoidectomy     No family history on file.  History  Substance Use Topics  . Smoking status: Former Smoker    Types: Cigarettes  . Smokeless tobacco: Never Used  . Alcohol Use: No    OB History    Grav Para Term Preterm Abortions TAB SAB Ect Mult Living   2 2              Review of Systems  All other systems reviewed and are negative.    Allergies  Codeine; Food; and Peanut-containing drug products  Home Medications   Current Outpatient Rx  Name  Route  Sig  Dispense  Refill  . ALBUTEROL SULFATE HFA IN   Inhalation   Inhale into the lungs.           [place] IN   Inhalation   Inhale into the lungs.           . CETIRIZINE HCL 10 MG PO TABS   Oral   Take 10 mg by mouth daily.           Marland Kitchen FLUTICASONE PROPIONATE 50  MCG/ACT NA SUSP   Nasal   Place 2 sprays into the nose daily.           Marland Kitchen OMEPRAZOLE 10 MG PO CPDR   Oral   Take 10 mg by mouth 2 (two) times daily.         Marland Kitchen OVER THE COUNTER MEDICATION      Cough drop, cold medicine, pain med          . TRIAMCINOLONE ACETONIDE 0.1 % EX CREA   Topical   Apply topically 2 (two) times daily.   15 g   0     BP 120/73  Pulse 91  Temp 98.1 F (36.7 C) (Oral)  Resp 20  SpO2 99%  LMP 12/21/2012  Physical Exam  Nursing note and vitals reviewed.  32 year old female, resting comfortably and in no acute distress. Vital signs are normal. Oxygen saturation is 99%, which is normal. Head is normocephalic and atraumatic. PERRLA, EOMI. Oropharynx is clear. Neck is nontender and supple without adenopathy or JVD. Back is nontender and there is no CVA tenderness. Lungs are clear without rales, wheezes, or rhonchi. Chest is nontender. Heart  has regular rate and rhythm without murmur. Abdomen is soft, flat, nontender without masses or hepatosplenomegaly and peristalsis is normoactive. Extremities have no cyanosis or edema, full range of motion is present. Skin is warm and dry without rash. Neurologic: Mental status is normal, cranial nerves are intact, there are no motor or sensory deficits.  ED Course  Procedures (including critical care time)  Results for orders placed during the hospital encounter of 12/24/12  RAPID STREP SCREEN      Component Value Range   Streptococcus, Group A Screen (Direct) NEGATIVE  NEGATIVE   Dg Chest 2 View  12/24/2012  *RADIOLOGY REPORT*  Clinical Data: Cough.  CHEST - 2 VIEW  Comparison: January 02, 2011.  Findings: Cardiomediastinal silhouette appears normal.  No acute pulmonary disease is noted.  Bony thorax is intact.  IMPRESSION: No acute cardiopulmonary abnormality seen.   Original Report Authenticated By: Lupita Raider.,  M.D.       1. Influenza-like illness       MDM  Influenza-like illness. Strep  screen will be obtained to evaluate for possible streptococcal infection and chest x-ray be obtained to rule out pneumonia. She is outside of the treatment window for antiviral treatment of influenza.  Strep screen and chest x-ray are unremarkable. She is allergic to codeine, so cannot receive hydrocodone for cough. She's a vice use over-the-counter remedies. She is mainly concerned about nasal congestion so is advised to use something with pseudoephedrine. Also advised to useas long as she does not use them for more than 3 days. She states she had run out of her steroid inhaler and review of past records shows that she had been on Symbicort, so a new prescription is given for Symbicort.   Dione Booze, MD 12/24/12 1147

## 2012-12-24 NOTE — ED Notes (Signed)
Pt from home has multiple complaints. C/o sore throat, left ear pain, congestion, productive cough and Diarrhea

## 2012-12-24 NOTE — ED Notes (Signed)
Patient transported to X-ray 

## 2013-04-28 ENCOUNTER — Encounter (HOSPITAL_COMMUNITY): Payer: Self-pay | Admitting: Emergency Medicine

## 2013-04-28 ENCOUNTER — Emergency Department (INDEPENDENT_AMBULATORY_CARE_PROVIDER_SITE_OTHER)
Admission: EM | Admit: 2013-04-28 | Discharge: 2013-04-28 | Disposition: A | Discharge status: Nursing Home (Includes ICF) | Payer: Medicaid Other | Source: Home / Self Care | Attending: Family Medicine | Admitting: Family Medicine

## 2013-04-28 ENCOUNTER — Encounter (HOSPITAL_COMMUNITY): Payer: Self-pay | Admitting: *Deleted

## 2013-04-28 ENCOUNTER — Emergency Department (HOSPITAL_COMMUNITY)
Admission: EM | Admit: 2013-04-28 | Discharge: 2013-04-28 | Disposition: A | Discharge status: Home / Self Care | Payer: Medicaid Other | Attending: Emergency Medicine | Admitting: Emergency Medicine

## 2013-04-28 ENCOUNTER — Emergency Department (HOSPITAL_COMMUNITY): Payer: Medicaid Other

## 2013-04-28 DIAGNOSIS — R200 Anesthesia of skin: Secondary | ICD-10-CM

## 2013-04-28 DIAGNOSIS — Z8742 Personal history of other diseases of the female genital tract: Secondary | ICD-10-CM | POA: Insufficient documentation

## 2013-04-28 DIAGNOSIS — R079 Chest pain, unspecified: Secondary | ICD-10-CM

## 2013-04-28 DIAGNOSIS — Z885 Allergy status to narcotic agent status: Secondary | ICD-10-CM | POA: Insufficient documentation

## 2013-04-28 DIAGNOSIS — J45909 Unspecified asthma, uncomplicated: Secondary | ICD-10-CM | POA: Insufficient documentation

## 2013-04-28 DIAGNOSIS — D649 Anemia, unspecified: Secondary | ICD-10-CM | POA: Insufficient documentation

## 2013-04-28 DIAGNOSIS — Z3202 Encounter for pregnancy test, result negative: Secondary | ICD-10-CM | POA: Insufficient documentation

## 2013-04-28 DIAGNOSIS — R209 Unspecified disturbances of skin sensation: Secondary | ICD-10-CM

## 2013-04-28 DIAGNOSIS — Z79899 Other long term (current) drug therapy: Secondary | ICD-10-CM | POA: Insufficient documentation

## 2013-04-28 DIAGNOSIS — Z87891 Personal history of nicotine dependence: Secondary | ICD-10-CM | POA: Insufficient documentation

## 2013-04-28 LAB — CBC
HCT: 32.6 % — ABNORMAL LOW (ref 36.0–46.0)
Hemoglobin: 10.3 g/dL — ABNORMAL LOW (ref 12.0–15.0)
MCV: 75.5 fL — ABNORMAL LOW (ref 78.0–100.0)
RBC: 4.32 MIL/uL (ref 3.87–5.11)
RDW: 16.8 % — ABNORMAL HIGH (ref 11.5–15.5)
WBC: 9.7 10*3/uL (ref 4.0–10.5)

## 2013-04-28 LAB — BASIC METABOLIC PANEL
BUN: 10 mg/dL (ref 6–23)
CO2: 21 mEq/L (ref 19–32)
Chloride: 104 mEq/L (ref 96–112)
GFR calc Af Amer: >90 mL/min (ref >90)
Glucose, Bld: 100 mg/dL — ABNORMAL HIGH (ref 70–99)
Potassium: 3.8 mEq/L (ref 3.5–5.1)

## 2013-04-28 LAB — POCT I-STAT TROPONIN I

## 2013-04-28 MED ORDER — FERROUS SULFATE 325 (65 FE) MG PO TABS: 325.0000 mg | ORAL_TABLET | Freq: Every day | ORAL | Status: DC

## 2013-04-28 NOTE — ED Notes (Signed)
EKG done at [facility] and shown to Dr. Blinda Leatherwood and no need to do another at this time.  Signed

## 2013-04-28 NOTE — ED Notes (Signed)
Pt transfer from [facility].  Pt states she was at work and started having chest pain and left hand went numb at 0400, but now travels up arm.  Pt feels like her chest is cramping

## 2013-04-28 NOTE — ED Notes (Signed)
Instructed to put on gown 

## 2013-04-28 NOTE — ED Provider Notes (Signed)
History     CSN: [id number]  Arrival date & time 04/28/13  1416   First MD Initiated Contact with Patient 04/28/13 1723      Chief Complaint  Patient presents with  . Chest Pain    (Consider location/radiation/quality/duration/timing/severity/associated sxs/prior treatment) HPI Patient presents with concern chest pain, left arm numbness. The pain has been present for greater than 3 weeks, is intermittent, occurring without clear precipitant. The pain is anterior, left-sided, sore.  Today the patient's pain and seemed to radiate to the left arm with numbness in her left hand. There is no concurrent lightheadedness, syncope, confusion, disorientation. There is no dyspnea, cough, fever, chills. Patient does not smoke. She was seen in urgent care, referred here for further evaluation and management. Past Medical History  Diagnosis Date  . Asthma   . Abnormal Pap smear     Past Surgical History  Procedure Laterality Date  . Tonsillectomy    . Tonsillectomy and adenoidectomy    . Foot surgery    . Tonsillectomy and adenoidectomy      No family history on file.  History  Substance Use Topics  . Smoking status: Former Smoker    Types: Cigarettes  . Smokeless tobacco: Never Used  . Alcohol Use: No    OB History   Grav Para Term Preterm Abortions TAB SAB Ect Mult Living   2 2              Review of Systems  Constitutional:       Per HPI, otherwise negative  HENT:       Per HPI, otherwise negative  Respiratory:       Per HPI, otherwise negative  Cardiovascular:       Per HPI, otherwise negative  Gastrointestinal: Negative for vomiting.  Endocrine:       Negative aside from HPI  Genitourinary:       Neg aside from HPI   Musculoskeletal:       Per HPI, otherwise negative  Skin: Negative.   Neurological: Negative for syncope.    Allergies  Codeine; Food; and Peanut-containing drug products  Home Medications   Current Outpatient Rx  Name  Route  Sig   Dispense  Refill  . albuterol (PROVENTIL HFA;VENTOLIN HFA) 108 (90 BASE) MCG/ACT inhaler   Inhalation   Inhale 2 puffs into the lungs every 6 (six) hours as needed for wheezing or shortness of breath.          . budesonide-formoterol (SYMBICORT) 80-4.5 MCG/ACT inhaler   Inhalation   Inhale 2 puffs into the lungs 2 (two) times daily.   1 Inhaler   0   . EPINEPHrine (EPIPEN JR) 0.15 MG/0.3 ML injection   Intramuscular   Inject 0.15 mg into the muscle daily as needed for anaphylaxis.           BP 127/77  Pulse 75  Temp(Src) 98.4 F (36.9 C) (Oral)  Resp 16  SpO2 100%  LMP 04/13/2013  Physical Exam  Nursing note and vitals reviewed. Constitutional: She is oriented to person, place, and time. She appears well-developed and well-nourished. No distress.  HENT:  Head: Normocephalic and atraumatic.  Eyes: Conjunctivae and EOM are normal.  Cardiovascular: Normal rate and regular rhythm.   Pulmonary/Chest: Effort normal and breath sounds normal. No stridor. No respiratory distress.  Mild diffuse tenderness to palpation throughout the superior left lateral chest wall no deformity, no erythema  Abdominal: She exhibits no distension.  Musculoskeletal: She exhibits no edema.  Neurological: She is alert and oriented to person, place, and time. No cranial nerve deficit.  Minimal subjective sensory change in the left upper extremity, with no objective loss of function or sensation  Skin: Skin is warm and dry.  Psychiatric: She has a normal mood and affect.    ED Course  Procedures (including critical care time)  Labs Reviewed  CBC - Abnormal; Notable for the following:    Hemoglobin 10.3 (*)    HCT 32.6 (*)    MCV 75.5 (*)    MCH 23.8 (*)    RDW 16.8 (*)    All other components within normal limits  BASIC METABOLIC PANEL - Abnormal; Notable for the following:    Glucose, Bld 100 (*)    All other components within normal limits  POCT PREGNANCY, URINE  POCT I-STAT TROPONIN I    Dg Chest 2 View  04/28/2013   *RADIOLOGY REPORT*  Clinical Data: Chest pain  CHEST - 2 VIEW  Comparison:  12/24/2012  Findings:  The heart size and mediastinal contours are within normal limits.  Both lungs are clear.  The visualized skeletal structures are unremarkable.  IMPRESSION: No active cardiopulmonary disease.   Original Report Authenticated By: Judie Petit. Miles Costain, M.D.     No diagnosis found.  Cardiac 85 sinus rhythm normal Pulse ox 100% room air normal    Date: 04/28/2013  Rate: 75  Rhythm: normal sinus rhythm with sinus arrhythmia  QRS Axis: normal  Intervals: normal  ST/T Wave abnormalities: normal  Conduction Disutrbances:none  Narrative Interpretation:   Old EKG Reviewed: none available  BORDERLINE  Initial labs demonstrate a notable anemia, with findings consistent with iron deficiency. She states that she was previously diagnosed with vitamin D deficiency, though she takes no current supplements the  MDM  This generally well-appearing female presents with concerns of newly chronic left-sided chest pain, new left hand dysesthesia.  On exam she is awake alert, in no distress, neurologically intact.  Given the absence of risk factors, there is low suspicion for ACS.  Additionally, with the lab results, there suspicion for anemia as contributory to her dysesthesia and pain, with consideration of inflammation as well.  We discussed initiation of iron supplements, primary care followup for both further evaluation and management of her anemia, as well as consideration of her musculoskeletal pain.          Gerhard Munch, MD 04/28/13 [phone]

## 2013-04-28 NOTE — ED Notes (Signed)
Pt discharged.Vital signs stable and GCS 15 

## 2013-04-28 NOTE — ED Provider Notes (Signed)
History     CSN: [id number]  Arrival date & time 04/28/13  1252   First MD Initiated Contact with Patient 04/28/13 1331      Chief Complaint  Patient presents with  . Chest Pain    (Consider location/radiation/quality/duration/timing/severity/associated sxs/prior treatment) Patient is a 32 y.o. female presenting with chest pain.  Chest Pain Associated symptoms: numbness and shortness of breath    This is a 32 year old female who was working in the middle of the night when she notice numbness and tingling in her left hand. She states that the numbness and tingling has moved up her arm to her shoulder. In addition she noticed some "pulling" pain under her left breast and some shortness of breath. She used her daughter's inhaler and this helped a little. She has not noticed any weakness of her left arm and has no other neurological symptoms. Past Medical History  Diagnosis Date  . Asthma   . Abnormal Pap smear     Past Surgical History  Procedure Laterality Date  . Tonsillectomy    . Tonsillectomy and adenoidectomy    . Foot surgery    . Tonsillectomy and adenoidectomy      No family history on file.  History  Substance Use Topics  . Smoking status: Former Smoker    Types: Cigarettes  . Smokeless tobacco: Never Used  . Alcohol Use: No    OB History   Grav Para Term Preterm Abortions TAB SAB Ect Mult Living   2 2              Review of Systems  Constitutional: Negative.   HENT: Negative.   Eyes: Negative.   Respiratory: Positive for shortness of breath.   Cardiovascular: Positive for chest pain.  Gastrointestinal: Negative.   Genitourinary: Negative.   Musculoskeletal: Negative.   Skin: Negative.   Neurological: Positive for light-headedness and numbness.  Hematological: Negative.   Psychiatric/Behavioral: Negative.     Allergies  Codeine; Food; and Peanut-containing drug products  Home Medications   Current Outpatient Rx  Name  Route  Sig  Dispense   Refill  . budesonide-formoterol (SYMBICORT) 80-4.5 MCG/ACT inhaler   Inhalation   Inhale 2 puffs into the lungs 2 (two) times daily.   1 Inhaler   0   . omeprazole (PRILOSEC) 20 MG capsule   Oral   Take 20 mg by mouth daily.         Marland Kitchen albuterol (PROVENTIL HFA;VENTOLIN HFA) 108 (90 BASE) MCG/ACT inhaler   Inhalation   Inhale 2 puffs into the lungs every 6 (six) hours as needed. For shortness of breath         . cetirizine (ZYRTEC) 10 MG tablet   Oral   Take 10 mg by mouth daily.             BP 145/77  Pulse 92  Temp(Src) 98.5 F (36.9 C) (Oral)  Resp 16  SpO2 99%  LMP 04/13/2013  Physical Exam  Constitutional: She is oriented to person, place, and time. She appears well-developed and well-nourished.  HENT:  Head: Normocephalic and atraumatic.  Eyes: Conjunctivae are normal. Pupils are equal, round, and reactive to light.  Neck: Normal range of motion. Neck supple.  Cardiovascular: Normal rate, regular rhythm and normal heart sounds.   Pulmonary/Chest: Effort normal and breath sounds normal.  Abdominal: Soft. Bowel sounds are normal.  Musculoskeletal: Normal range of motion.  Neurological: She is alert and oriented to person, place, and time. No cranial nerve  deficit.  Numbness to light touch noted all along left arm. No weakness noted.  Skin: Skin is warm and dry.  Psychiatric: She has a normal mood and affect. Her behavior is normal.    ED Course  Procedures (including critical care time)  Labs Reviewed - No data to display No results found.   1. Chest pain at rest   2. Left arm numbness       MDM  Will refer to the ER.        Calvert Cantor, MD 04/28/13 1347

## 2013-04-28 NOTE — ED Notes (Signed)
Patient reports chest pain, like pulling sensation and left arm numbness.  Initially numbness and tingling involved hand , now has moved up arm and into shoulder.  One episode of vomiting this am, no diaphoresis, intermittent sob.  .  Patient reports her treatments have been resting and trying to stay calm

## 2013-08-03 ENCOUNTER — Encounter (HOSPITAL_COMMUNITY): Payer: Self-pay | Admitting: *Deleted

## 2013-08-03 ENCOUNTER — Emergency Department (HOSPITAL_COMMUNITY): Payer: Medicaid Other

## 2013-08-03 ENCOUNTER — Emergency Department (HOSPITAL_COMMUNITY)
Admission: EM | Admit: 2013-08-03 | Discharge: 2013-08-03 | Disposition: A | Discharge status: Home / Self Care | Payer: Medicaid Other | Attending: Emergency Medicine | Admitting: Emergency Medicine

## 2013-08-03 DIAGNOSIS — B350 Tinea barbae and tinea capitis: Secondary | ICD-10-CM

## 2013-08-03 DIAGNOSIS — Z87891 Personal history of nicotine dependence: Secondary | ICD-10-CM | POA: Insufficient documentation

## 2013-08-03 DIAGNOSIS — S46912A Strain of unspecified muscle, fascia and tendon at shoulder and upper arm level, left arm, initial encounter: Secondary | ICD-10-CM

## 2013-08-03 DIAGNOSIS — Y92410 Unspecified street and highway as the place of occurrence of the external cause: Secondary | ICD-10-CM | POA: Insufficient documentation

## 2013-08-03 DIAGNOSIS — Z8719 Personal history of other diseases of the digestive system: Secondary | ICD-10-CM | POA: Insufficient documentation

## 2013-08-03 DIAGNOSIS — J45909 Unspecified asthma, uncomplicated: Secondary | ICD-10-CM | POA: Insufficient documentation

## 2013-08-03 DIAGNOSIS — E669 Obesity, unspecified: Secondary | ICD-10-CM | POA: Insufficient documentation

## 2013-08-03 DIAGNOSIS — R609 Edema, unspecified: Secondary | ICD-10-CM

## 2013-08-03 DIAGNOSIS — IMO0002 Reserved for concepts with insufficient information to code with codable children: Secondary | ICD-10-CM | POA: Insufficient documentation

## 2013-08-03 DIAGNOSIS — Z79899 Other long term (current) drug therapy: Secondary | ICD-10-CM | POA: Insufficient documentation

## 2013-08-03 DIAGNOSIS — R600 Localized edema: Secondary | ICD-10-CM

## 2013-08-03 DIAGNOSIS — Y9389 Activity, other specified: Secondary | ICD-10-CM | POA: Insufficient documentation

## 2013-08-03 HISTORY — DX: Obesity, unspecified: E66.9

## 2013-08-03 HISTORY — DX: Gastro-esophageal reflux disease without esophagitis: K21.9

## 2013-08-03 LAB — BASIC METABOLIC PANEL
CO2: 23 mEq/L (ref 19–32)
Calcium: 9 mg/dL (ref 8.4–10.5)
GFR calc non Af Amer: >90 mL/min (ref >90)
Potassium: 4 mEq/L (ref 3.5–5.1)
Sodium: 136 mEq/L (ref 135–145)

## 2013-08-03 MED ORDER — CYCLOBENZAPRINE HCL 10 MG PO TABS: 10.0000 mg | ORAL_TABLET | Freq: Three times a day (TID) | ORAL | Status: DC | PRN

## 2013-08-03 MED ORDER — KETOCONAZOLE 1 % EX SHAM: 1.0000 "application " | MEDICATED_SHAMPOO | CUTANEOUS | Status: DC

## 2013-08-03 MED ORDER — IBUPROFEN 800 MG PO TABS: 800.0000 mg | ORAL_TABLET | Freq: Three times a day (TID) | ORAL | Status: DC
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
## 2013-08-03 NOTE — ED Notes (Signed)
Pt c/o bilateral foot swelling x2 days, in addition to other complaints.
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
## 2013-08-03 NOTE — ED Notes (Signed)
Patient returned to adult ED  from pediatrics ED.Marland KitchenMarland Kitchen

## 2013-08-03 NOTE — ED Notes (Signed)
Patient registered then waiting in peds with daughter

## 2013-09-20 ENCOUNTER — Emergency Department (HOSPITAL_COMMUNITY)
Admission: EM | Admit: 2013-09-20 | Discharge: 2013-09-20 | Disposition: A | Discharge status: Home / Self Care | Payer: Medicaid Other | Source: Home / Self Care | Attending: Family Medicine | Admitting: Family Medicine

## 2013-09-20 ENCOUNTER — Encounter (HOSPITAL_COMMUNITY): Payer: Self-pay | Admitting: Emergency Medicine

## 2013-09-20 DIAGNOSIS — L250 Unspecified contact dermatitis due to cosmetics: Secondary | ICD-10-CM

## 2013-09-20 MED ORDER — FLUTICASONE PROPIONATE 0.05 % EX CREA: TOPICAL_CREAM | Freq: Two times a day (BID) | CUTANEOUS | Status: DC

## 2013-09-20 NOTE — ED Notes (Signed)
Facial skin reaction.  Onset Saturday.

## 2013-09-20 NOTE — ED Provider Notes (Signed)
CSN: [id number]     Arrival date & time 09/20/13  1422 History   First MD Initiated Contact with Patient 09/20/13 1507     No chief complaint on file.  (Consider location/radiation/quality/duration/timing/severity/associated sxs/prior Treatment) Patient is a 32 y.o. female presenting with rash. The history is provided by the patient.  Rash Pain severity:  No pain Onset quality:  Gradual Progression:  Unchanged Chronicity:  New Context comment:  Facial rash from makeup at Halloween, Relieved by:  None tried   Past Medical History  Diagnosis Date  . Asthma   . Abnormal Pap smear   . Obesity   . Acid reflux    Past Surgical History  Procedure Laterality Date  . Tonsillectomy    . Tonsillectomy and adenoidectomy    . Foot surgery    . Tonsillectomy and adenoidectomy     No family history on file. History  Substance Use Topics  . Smoking status: Former Smoker    Types: Cigarettes  . Smokeless tobacco: Never Used  . Alcohol Use: No   OB History   Grav Para Term Preterm Abortions TAB SAB Ect Mult Living   2 2             Review of Systems  Constitutional: Negative.   Musculoskeletal: Negative.   Skin: Positive for rash. Negative for wound.    Allergies  Codeine and Peanut-containing drug products  Home Medications   Current Outpatient Rx  Name  Route  Sig  Dispense  Refill  . albuterol (PROVENTIL HFA;VENTOLIN HFA) 108 (90 BASE) MCG/ACT inhaler   Inhalation   Inhale 2 puffs into the lungs every 6 (six) hours as needed for wheezing or shortness of breath.          . budesonide-formoterol (SYMBICORT) 80-4.5 MCG/ACT inhaler   Inhalation   Inhale 2 puffs into the lungs 2 (two) times daily.   1 Inhaler   0   . cyclobenzaprine (FLEXERIL) 10 MG tablet   Oral   Take 1 tablet (10 mg total) by mouth 3 (three) times daily as needed for muscle spasms.   30 tablet   0   . EPINEPHrine (EPIPEN JR) 0.15 MG/0.3 ML injection   Intramuscular   Inject 0.15 mg into the  muscle daily as needed for anaphylaxis.         . ferrous sulfate 325 (65 FE) MG tablet   Oral   Take 1 tablet (325 mg total) by mouth daily.   30 tablet   0   . fluticasone (CUTIVATE) 0.05 % cream   Topical   Apply topically 2 (two) times daily.   30 g   1   . ibuprofen (ADVIL,MOTRIN) 200 MG tablet   Oral   Take 400 mg by mouth every 6 (six) hours as needed for pain or headache.         . ibuprofen (ADVIL,MOTRIN) 800 MG tablet   Oral   Take 1 tablet (800 mg total) by mouth 3 (three) times daily.   21 tablet   0   . KETOCONAZOLE, TOPICAL, 1 % SHAM   Apply externally   Apply 1 application topically 3 (three) times a week.   125 mL   0    BP 132/84  Pulse 90  Temp(Src) 98.2 F (36.8 C) (Oral)  Resp 18  SpO2 99% Physical Exam  Nursing note and vitals reviewed. Constitutional: She is oriented to person, place, and time. She appears well-developed and well-nourished.  HENT:  Head:  Normocephalic.  Neck: Normal range of motion. Neck supple.  Lymphadenopathy:    She has no cervical adenopathy.  Neurological: She is alert and oriented to person, place, and time.  Skin: Skin is warm and dry. Rash noted.  Dry facial rash bilat r>l , pruritic.  Psychiatric: She has a normal mood and affect.    ED Course  Procedures (including critical care time) Labs Review Labs Reviewed - No data to display Imaging Review No results found.    MDM      Linna Hoff, MD 09/20/13 [phone]

## 2013-10-06 ENCOUNTER — Emergency Department (HOSPITAL_COMMUNITY)
Admission: EM | Admit: 2013-10-06 | Discharge: 2013-10-06 | Disposition: A | Discharge status: Home / Self Care | Payer: Medicaid Other | Attending: Emergency Medicine | Admitting: Emergency Medicine

## 2013-10-06 ENCOUNTER — Emergency Department (HOSPITAL_COMMUNITY): Payer: Medicaid Other

## 2013-10-06 ENCOUNTER — Encounter (HOSPITAL_COMMUNITY): Payer: Self-pay | Admitting: Emergency Medicine

## 2013-10-06 DIAGNOSIS — Z87891 Personal history of nicotine dependence: Secondary | ICD-10-CM | POA: Insufficient documentation

## 2013-10-06 DIAGNOSIS — R6883 Chills (without fever): Secondary | ICD-10-CM | POA: Insufficient documentation

## 2013-10-06 DIAGNOSIS — R112 Nausea with vomiting, unspecified: Secondary | ICD-10-CM | POA: Insufficient documentation

## 2013-10-06 DIAGNOSIS — B9789 Other viral agents as the cause of diseases classified elsewhere: Secondary | ICD-10-CM | POA: Insufficient documentation

## 2013-10-06 DIAGNOSIS — D649 Anemia, unspecified: Secondary | ICD-10-CM | POA: Insufficient documentation

## 2013-10-06 DIAGNOSIS — B349 Viral infection, unspecified: Secondary | ICD-10-CM

## 2013-10-06 DIAGNOSIS — Z3202 Encounter for pregnancy test, result negative: Secondary | ICD-10-CM | POA: Insufficient documentation

## 2013-10-06 DIAGNOSIS — IMO0002 Reserved for concepts with insufficient information to code with codable children: Secondary | ICD-10-CM | POA: Insufficient documentation

## 2013-10-06 DIAGNOSIS — J45909 Unspecified asthma, uncomplicated: Secondary | ICD-10-CM | POA: Insufficient documentation

## 2013-10-06 DIAGNOSIS — Z79899 Other long term (current) drug therapy: Secondary | ICD-10-CM | POA: Insufficient documentation

## 2013-10-06 DIAGNOSIS — Z8719 Personal history of other diseases of the digestive system: Secondary | ICD-10-CM | POA: Insufficient documentation

## 2013-10-06 DIAGNOSIS — E669 Obesity, unspecified: Secondary | ICD-10-CM | POA: Insufficient documentation

## 2013-10-06 LAB — COMPREHENSIVE METABOLIC PANEL
AST: 30 U/L (ref 0–37)
Alkaline Phosphatase: 78 U/L (ref 39–117)
BUN: 9 mg/dL (ref 6–23)
CO2: 21 mEq/L (ref 19–32)
Calcium: 9.1 mg/dL (ref 8.4–10.5)
Chloride: 105 mEq/L (ref 96–112)
Creatinine, Ser: 0.74 mg/dL (ref 0.50–1.10)
GFR calc non Af Amer: >90 mL/min (ref >90)
Glucose, Bld: 94 mg/dL (ref 70–99)
Total Bilirubin: 0.5 mg/dL (ref 0.3–1.2)
Total Protein: 6.7 g/dL (ref 6.0–8.3)

## 2013-10-06 LAB — URINALYSIS, ROUTINE W REFLEX MICROSCOPIC
Bilirubin Urine: NEGATIVE
Ketones, ur: NEGATIVE mg/dL
Leukocytes, UA: NEGATIVE
Nitrite: NEGATIVE
Protein, ur: NEGATIVE mg/dL
pH: 6.5 (ref 5.0–8.0)

## 2013-10-06 LAB — CBC WITH DIFFERENTIAL/PLATELET
Basophils Absolute: 0 10*3/uL (ref 0.0–0.1)
Eosinophils Absolute: 0.3 10*3/uL (ref 0.0–0.7)
HCT: 35.8 % — ABNORMAL LOW (ref 36.0–46.0)
Hemoglobin: 12.1 g/dL (ref 12.0–15.0)
Lymphocytes Relative: 34 % (ref 12–46)
Lymphs Abs: 3 10*3/uL (ref 0.7–4.0)
Monocytes Absolute: 0.7 10*3/uL (ref 0.1–1.0)
Monocytes Relative: 8 % (ref 3–12)
Neutro Abs: 4.9 10*3/uL (ref 1.7–7.7)
Platelets: 285 10*3/uL (ref 150–400)
RBC: 4.32 MIL/uL (ref 3.87–5.11)
WBC: 8.9 10*3/uL (ref 4.0–10.5)

## 2013-10-06 LAB — PRO B NATRIURETIC PEPTIDE: Pro B Natriuretic peptide (BNP): 171.9 pg/mL — ABNORMAL HIGH (ref 0–125)

## 2013-10-06 NOTE — ED Notes (Signed)
Patient transported to X-ray 

## 2013-10-06 NOTE — ED Notes (Signed)
Bed: LK44 Expected date:  Expected time:  Means of arrival:  Comments: EMS/32 yo feeling weak-hx decreased iron-off meds

## 2013-10-06 NOTE — ED Notes (Signed)
Per EMS report: pt from home: pt c/o of generalized weakness that has become progressively worse over the past 2 days. Pt also reports some hot and cold flashes. Pt reports vomiting x 1 tonight at around 22:30.  Hx of anemia and iron deficieny.  Pt has not been taking iron pills for the past month. Pt a/o x 4.  Skin warm and dry.  Pt has some redness on her cheeks but pt reports an allergic reaction to halloween make up.

## 2013-10-06 NOTE — ED Provider Notes (Signed)
CSN: [id number]     Arrival date & time 10/06/13  0132 History   First MD Initiated Contact with Patient 10/06/13 0203     Chief Complaint  Patient presents with  . Weakness  . Nausea  . Emesis   (Consider location/radiation/quality/duration/timing/severity/associated sxs/prior Treatment) HPI Patient presents with 2 days of increased fatigue, generalized weakness and cough. She states that she has had a cough productive of white phlegm. She's had increased chills without fever. She denies any abdominal pain, chest pain, nausea, diarrhea. She states she has a history of anemia has not been taking her iron as previously prescribed. Denies any bloody or melanotic stools. Past Medical History  Diagnosis Date  . Asthma   . Abnormal Pap smear   . Obesity   . Acid reflux    Past Surgical History  Procedure Laterality Date  . Tonsillectomy    . Tonsillectomy and adenoidectomy    . Foot surgery    . Tonsillectomy and adenoidectomy    . Wisdom tooth extraction     No family history on file. History  Substance Use Topics  . Smoking status: Former Smoker    Types: Cigarettes  . Smokeless tobacco: Never Used  . Alcohol Use: No   OB History   Grav Para Term Preterm Abortions TAB SAB Ect Mult Living   2 2             Review of Systems  Constitutional: Positive for fatigue. Negative for fever and chills.  HENT: Negative for sinus pressure and sore throat.   Respiratory: Positive for cough. Negative for shortness of breath.   Cardiovascular: Negative for chest pain, palpitations and leg swelling.  Gastrointestinal: Negative for nausea, vomiting, abdominal pain, diarrhea and constipation.  Genitourinary: Negative for dysuria, flank pain, vaginal bleeding and vaginal discharge.  Musculoskeletal: Negative for back pain and myalgias.  Skin: Negative for rash and wound.  Neurological: Positive for weakness (generalized). Negative for dizziness, numbness and headaches.  All other systems  reviewed and are negative.    Allergies  Codeine and Peanut-containing drug products  Home Medications   Current Outpatient Rx  Name  Route  Sig  Dispense  Refill  . albuterol (PROVENTIL HFA;VENTOLIN HFA) 108 (90 BASE) MCG/ACT inhaler   Inhalation   Inhale 2 puffs into the lungs every 6 (six) hours as needed for wheezing or shortness of breath.          . budesonide-formoterol (SYMBICORT) 80-4.5 MCG/ACT inhaler   Inhalation   Inhale 2 puffs into the lungs 2 (two) times daily.   1 Inhaler   0   . cyclobenzaprine (FLEXERIL) 10 MG tablet   Oral   Take 1 tablet (10 mg total) by mouth 3 (three) times daily as needed for muscle spasms.   30 tablet   0   . EPINEPHrine (EPIPEN JR) 0.15 MG/0.3 ML injection   Intramuscular   Inject 0.15 mg into the muscle daily as needed for anaphylaxis.         . ferrous sulfate 325 (65 FE) MG tablet   Oral   Take 1 tablet (325 mg total) by mouth daily.   30 tablet   0   . fluticasone (CUTIVATE) 0.05 % cream   Topical   Apply topically 2 (two) times daily.   30 g   1   . ibuprofen (ADVIL,MOTRIN) 200 MG tablet   Oral   Take 400 mg by mouth every 6 (six) hours as needed for pain or headache.         Marland Kitchen  ibuprofen (ADVIL,MOTRIN) 800 MG tablet   Oral   Take 1 tablet (800 mg total) by mouth 3 (three) times daily.   21 tablet   0   . KETOCONAZOLE, TOPICAL, 1 % SHAM   Apply externally   Apply 1 application topically 3 (three) times a week.   125 mL   0    BP 114/55  Pulse 77  Temp(Src) 97.4 F (36.3 C) (Oral)  Resp 18  SpO2 100%  LMP 09/17/2013 Physical Exam  Nursing note and vitals reviewed. Constitutional: She is oriented to person, place, and time. She appears well-developed and well-nourished. No distress.  HENT:  Head: Normocephalic and atraumatic.  Mouth/Throat: Oropharynx is clear and moist.  Eyes: EOM are normal. Pupils are equal, round, and reactive to light.  Neck: Normal range of motion. Neck supple.   Cardiovascular: Normal rate and regular rhythm.   Pulmonary/Chest: Effort normal and breath sounds normal. No respiratory distress. She has no wheezes. She has no rales. She exhibits no tenderness.  Abdominal: Soft. Bowel sounds are normal. She exhibits no distension and no mass. There is tenderness (mild epigastric tenderness.). There is no rebound and no guarding.  Musculoskeletal: Normal range of motion. She exhibits no edema and no tenderness.  No CVA tenderness. Mild bilateral pedal edema with 1+ pitting. No calf tenderness  Neurological: She is alert and oriented to person, place, and time.  Moves all extremities without deficit. Sensation grossly intact.  Skin: Skin is warm and dry. No rash noted. No erythema.  Psychiatric: She has a normal mood and affect. Her behavior is normal.    ED Course  Procedures (including critical care time) Labs Review Labs Reviewed  CBC WITH DIFFERENTIAL  COMPREHENSIVE METABOLIC PANEL  LIPASE, BLOOD  PRO B NATRIURETIC PEPTIDE  URINALYSIS, ROUTINE W REFLEX MICROSCOPIC  PREGNANCY, URINE   Imaging Review No results found.  EKG Interpretation   None       MDM   Patient continues to be well-appearing in emergency department. Her vital signs and laboratory workup are normal. Her chest x-ray shows no pneumonia. The patient likely has a viral infection. We'll discharge home. Patient given return precautions.   Loren Racer, MD 10/06/13 0400

## 2014-01-09 ENCOUNTER — Emergency Department (HOSPITAL_COMMUNITY): Payer: Medicaid Other

## 2014-01-09 ENCOUNTER — Emergency Department (HOSPITAL_COMMUNITY)
Admission: EM | Admit: 2014-01-09 | Discharge: 2014-01-09 | Disposition: A | Discharge status: Home / Self Care | Payer: Medicaid Other | Attending: Emergency Medicine | Admitting: Emergency Medicine

## 2014-01-09 ENCOUNTER — Encounter (HOSPITAL_COMMUNITY): Payer: Self-pay | Admitting: Emergency Medicine

## 2014-01-09 DIAGNOSIS — R0789 Other chest pain: Secondary | ICD-10-CM

## 2014-01-09 DIAGNOSIS — Z79899 Other long term (current) drug therapy: Secondary | ICD-10-CM | POA: Insufficient documentation

## 2014-01-09 DIAGNOSIS — Z87891 Personal history of nicotine dependence: Secondary | ICD-10-CM | POA: Insufficient documentation

## 2014-01-09 DIAGNOSIS — K219 Gastro-esophageal reflux disease without esophagitis: Secondary | ICD-10-CM | POA: Insufficient documentation

## 2014-01-09 DIAGNOSIS — J45909 Unspecified asthma, uncomplicated: Secondary | ICD-10-CM | POA: Insufficient documentation

## 2014-01-09 DIAGNOSIS — R609 Edema, unspecified: Secondary | ICD-10-CM | POA: Insufficient documentation

## 2014-01-09 DIAGNOSIS — J329 Chronic sinusitis, unspecified: Secondary | ICD-10-CM | POA: Insufficient documentation

## 2014-01-09 DIAGNOSIS — R072 Precordial pain: Secondary | ICD-10-CM | POA: Insufficient documentation

## 2014-01-09 DIAGNOSIS — R110 Nausea: Secondary | ICD-10-CM | POA: Insufficient documentation

## 2014-01-09 DIAGNOSIS — E669 Obesity, unspecified: Secondary | ICD-10-CM | POA: Insufficient documentation

## 2014-01-09 DIAGNOSIS — J029 Acute pharyngitis, unspecified: Secondary | ICD-10-CM | POA: Insufficient documentation

## 2014-01-09 LAB — CBC WITH DIFFERENTIAL/PLATELET
BASOS PCT: 0 % (ref 0–1)
Basophils Absolute: 0 10*3/uL (ref 0.0–0.1)
Eosinophils Absolute: 0.2 10*3/uL (ref 0.0–0.7)
Eosinophils Relative: 2 % (ref 0–5)
HEMATOCRIT: 36.6 % (ref 36.0–46.0)
Hemoglobin: 11.8 g/dL — ABNORMAL LOW (ref 12.0–15.0)
LYMPHS PCT: 37 % (ref 12–46)
Lymphs Abs: 2.9 10*3/uL (ref 0.7–4.0)
MCH: 27.3 pg (ref 26.0–34.0)
MCHC: 32.2 g/dL (ref 30.0–36.0)
MCV: 84.7 fL (ref 78.0–100.0)
Monocytes Absolute: 0.4 10*3/uL (ref 0.1–1.0)
Monocytes Relative: 6 % (ref 3–12)
NEUTROS ABS: 4.3 10*3/uL (ref 1.7–7.7)
Neutrophils Relative %: 55 % (ref 43–77)
PLATELETS: 281 10*3/uL (ref 150–400)
RBC: 4.32 MIL/uL (ref 3.87–5.11)
RDW: 15 % (ref 11.5–15.5)
WBC: 7.8 10*3/uL (ref 4.0–10.5)

## 2014-01-09 LAB — COMPREHENSIVE METABOLIC PANEL
ALK PHOS: 82 U/L (ref 39–117)
ALT: 12 U/L (ref 0–35)
AST: 13 U/L (ref 0–37)
Albumin: 3.5 g/dL (ref 3.5–5.2)
BUN: 8 mg/dL (ref 6–23)
CHLORIDE: 104 meq/L (ref 96–112)
CO2: 24 meq/L (ref 19–32)
CREATININE: 0.73 mg/dL (ref 0.50–1.10)
Calcium: 8.7 mg/dL (ref 8.4–10.5)
GFR calc Af Amer: >90 mL/min (ref >90)
Glucose, Bld: 89 mg/dL (ref 70–99)
POTASSIUM: 4.1 meq/L (ref 3.7–5.3)
SODIUM: 138 meq/L (ref 137–147)
Total Bilirubin: 0.6 mg/dL (ref 0.3–1.2)
Total Protein: 6.9 g/dL (ref 6.0–8.3)

## 2014-01-09 LAB — URINALYSIS, ROUTINE W REFLEX MICROSCOPIC
Bilirubin Urine: NEGATIVE
Glucose, UA: NEGATIVE mg/dL
HGB URINE DIPSTICK: NEGATIVE
KETONES UR: NEGATIVE mg/dL
LEUKOCYTES UA: NEGATIVE
Nitrite: NEGATIVE
PH: 7 (ref 5.0–8.0)
Protein, ur: NEGATIVE mg/dL
Specific Gravity, Urine: 1.017 (ref 1.005–1.030)
Urobilinogen, UA: 1 mg/dL (ref 0.0–1.0)

## 2014-01-09 LAB — LIPASE, BLOOD: Lipase: 13 U/L (ref 11–59)

## 2014-01-09 LAB — PRO B NATRIURETIC PEPTIDE: Pro B Natriuretic peptide (BNP): 352.9 pg/mL — ABNORMAL HIGH (ref 0–125)

## 2014-01-09 LAB — TROPONIN I: Troponin I: <0.3 ng/mL (ref <0.30)

## 2014-01-09 MED ORDER — AMOXICILLIN-POT CLAVULANATE 875-125 MG PO TABS: 1.0000 | ORAL_TABLET | Freq: Two times a day (BID) | ORAL | Status: DC

## 2014-01-09 MED ORDER — RANITIDINE HCL 150 MG PO CAPS: 150.0000 mg | ORAL_CAPSULE | Freq: Every day | ORAL | Status: DC

## 2014-01-09 MED ORDER — OMEPRAZOLE 20 MG PO CPDR: 20.0000 mg | DELAYED_RELEASE_CAPSULE | Freq: Two times a day (BID) | ORAL | Status: DC

## 2014-01-09 MED ORDER — ALBUTEROL SULFATE HFA 108 (90 BASE) MCG/ACT IN AERS
2.0000 | INHALATION_SPRAY | Freq: Once | RESPIRATORY_TRACT | Status: AC
  Administered 2014-01-09: 2 via RESPIRATORY_TRACT
  Filled 2014-01-09: qty 6.7

## 2014-01-09 MED ORDER — IBUPROFEN 800 MG PO TABS
800.0000 mg | ORAL_TABLET | Freq: Once | ORAL | Status: AC
  Administered 2014-01-09: 800 mg via ORAL
  Filled 2014-01-09: qty 1

## 2014-01-09 NOTE — ED Provider Notes (Signed)
CSN: [id number]     Arrival date & time 01/09/14  1228 History   First MD Initiated Contact with Patient 01/09/14 1408     Chief Complaint  Patient presents with  . Headache  . Nausea     (Consider location/radiation/quality/duration/timing/severity/associated sxs/prior Treatment) Patient is a 33 y.o. female presenting with headaches and chest pain. The history is provided by the patient. No language interpreter was used.  Headache Pain location:  Frontal Radiates to:  Does not radiate Pain severity now: moderate. Pain scale at highest: moderate. Onset quality:  Gradual Duration:  2 weeks Timing:  Constant Progression:  Waxing and waning Chronicity:  New Similar to prior headaches: no   Relieved by: advil, claritin. Worsened by:  Nothing tried Associated symptoms: congestion, cough, drainage, facial pain, sinus pressure, sore throat and URI   Associated symptoms: no abdominal pain, no back pain, no diarrhea, no dizziness, no ear pain, no pain, no fatigue, no fever, no focal weakness, no hearing loss, no loss of balance, no nausea, no near-syncope, no neck pain, no neck stiffness, no numbness, no paresthesias, no photophobia, no seizures, no syncope, no tingling, no visual change, no vomiting and no weakness   Congestion:    Location:  Nasal   Interferes with sleep: no     Interferes with eating/drinking: no   Cough:    Cough characteristics:  Productive   Sputum characteristics:  Nondescript   Severity:  Moderate   Duration:  2 weeks   Timing:  Intermittent   Progression:  Waxing and waning   Chronicity:  New Chest Pain Pain location:  Substernal area Pain quality: pressure   Pain radiates to:  Does not radiate Pain radiates to the back: no   Pain severity:  Moderate Onset quality:  Unable to specify Duration:  2 weeks Timing:  Constant Progression:  Unchanged Chronicity:  New Context: at rest   Relieved by:  Nothing Worsened by:  Nothing tried Ineffective  treatments:  None tried Associated symptoms: cough, headache and lower extremity edema (intermittently for several months, unchanged recently)   Associated symptoms: no abdominal pain, no back pain, no diaphoresis, no dizziness, no fatigue, no fever, no nausea, no near-syncope, no numbness, no palpitations, no shortness of breath, no syncope, not vomiting and no weakness   Risk factors: obesity   Risk factors: no birth control, no coronary artery disease, no diabetes mellitus, no high cholesterol, no hypertension, not pregnant, no prior DVT/PE and no smoking     Past Medical History  Diagnosis Date  . Asthma   . Abnormal Pap smear   . Obesity   . Acid reflux    Past Surgical History  Procedure Laterality Date  . Tonsillectomy    . Tonsillectomy and adenoidectomy    . Foot surgery    . Tonsillectomy and adenoidectomy    . Wisdom tooth extraction     History reviewed. No pertinent family history. History  Substance Use Topics  . Smoking status: Former Smoker    Types: Cigarettes  . Smokeless tobacco: Never Used  . Alcohol Use: No   OB History   Grav Para Term Preterm Abortions TAB SAB Ect Mult Living   2 2             Review of Systems  Constitutional: Negative for fever, chills, diaphoresis, activity change, appetite change and fatigue.  HENT: Positive for congestion, postnasal drip, sinus pressure and sore throat. Negative for ear pain, facial swelling, hearing loss and rhinorrhea.  Eyes: Negative for photophobia, pain and discharge.  Respiratory: Positive for cough. Negative for chest tightness and shortness of breath.   Cardiovascular: Positive for chest pain. Negative for palpitations, leg swelling, syncope and near-syncope.  Gastrointestinal: Negative for nausea, vomiting, abdominal pain and diarrhea.  Endocrine: Negative for polydipsia and polyuria.  Genitourinary: Negative for dysuria, frequency, difficulty urinating and pelvic pain.  Musculoskeletal: Negative for  arthralgias, back pain, neck pain and neck stiffness.  Skin: Negative for color change and wound.  Allergic/Immunologic: Negative for immunocompromised state.  Neurological: Positive for headaches. Negative for dizziness, focal weakness, seizures, facial asymmetry, weakness, numbness, paresthesias and loss of balance.  Hematological: Does not bruise/bleed easily.  Psychiatric/Behavioral: Negative for confusion and agitation.      Allergies  Codeine and Peanut-containing drug products  Home Medications   Current Outpatient Rx  Name  Route  Sig  Dispense  Refill  . albuterol (PROVENTIL HFA;VENTOLIN HFA) 108 (90 BASE) MCG/ACT inhaler   Inhalation   Inhale 2 puffs into the lungs every 6 (six) hours as needed for wheezing or shortness of breath.          . cetirizine (ZYRTEC) 10 MG tablet   Oral   Take 10 mg by mouth daily.         Marland Kitchen ibuprofen (ADVIL,MOTRIN) 200 MG tablet   Oral   Take 400 mg by mouth every 6 (six) hours as needed for mild pain or moderate pain.         . pseudoephedrine-acetaminophen (TYLENOL SINUS) 30-500 MG TABS   Oral   Take 1 tablet by mouth every 4 (four) hours as needed (congestion).         Marland Kitchen amoxicillin-clavulanate (AUGMENTIN) 875-125 MG per tablet   Oral   Take 1 tablet by mouth every 12 (twelve) hours.   14 tablet   0   . EPINEPHrine (EPIPEN JR) 0.15 MG/0.3 ML injection   Intramuscular   Inject 0.15 mg into the muscle daily as needed for anaphylaxis.         Marland Kitchen omeprazole (PRILOSEC) 20 MG capsule   Oral   Take 1 capsule (20 mg total) by mouth 2 (two) times daily before a meal.   60 capsule   0    BP 108/40  Pulse 78  Temp(Src) 99.1 F (37.3 C) (Oral)  Resp 18  SpO2 97%  LMP 12/21/2013 Physical Exam  Constitutional: She is oriented to person, place, and time. She appears well-developed and well-nourished. No distress.  HENT:  Head: Normocephalic and atraumatic.  Mouth/Throat: No oropharyngeal exudate.  Tenderness over  frontal and maxillary sinuses  Eyes: Pupils are equal, round, and reactive to light.  Neck: Normal range of motion. Neck supple.  Cardiovascular: Normal rate, regular rhythm and normal heart sounds.  Exam reveals no gallop and no friction rub.   No murmur heard. Pulmonary/Chest: Effort normal and breath sounds normal. No respiratory distress. She has no wheezes. She has no rales.  Abdominal: Soft. Bowel sounds are normal. She exhibits no distension and no mass. There is no tenderness. There is no rebound and no guarding.  Musculoskeletal: Normal range of motion. She exhibits no edema and no tenderness.  Neurological: She is alert and oriented to person, place, and time. She has normal strength. She displays no tremor. No cranial nerve deficit or sensory deficit. She exhibits normal muscle tone. Coordination normal. GCS eye subscore is 4. GCS verbal subscore is 5. GCS motor subscore is 6.  Skin: Skin is warm and  dry.  Psychiatric: She has a normal mood and affect.    ED Course  Procedures (including critical care time) Labs Review Labs Reviewed  CBC WITH DIFFERENTIAL - Abnormal; Notable for the following:    Hemoglobin 11.8 (*)    All other components within normal limits  PRO B NATRIURETIC PEPTIDE - Abnormal; Notable for the following:    Pro B Natriuretic peptide (BNP) 352.9 (*)    All other components within normal limits  COMPREHENSIVE METABOLIC PANEL  LIPASE, BLOOD  URINALYSIS, ROUTINE W REFLEX MICROSCOPIC  TROPONIN I  POC URINE PREG, ED   Imaging Review Dg Chest 2 View  01/09/2014   CLINICAL DATA:  Cough.  Mid chest pain.  EXAM: CHEST  2 VIEW  COMPARISON:  10/06/2013  FINDINGS: The heart size and mediastinal contours are within normal limits. Both lungs are clear. The visualized skeletal structures are unremarkable.  IMPRESSION: Normal exam.   Electronically Signed   By: Geanie CooleyJim  Maxwell M.D.   On: 01/09/2014 16:46    EKG Interpretation    Date/Time:  Sunday January 09 2014  12:40:03 EST Ventricular Rate:  70 PR Interval:  118 QRS Duration: 68 QT Interval:  396 QTC Calculation: 427 R Axis:   82 Text Interpretation:  Sinus rhythm Borderline short PR interval No significant change since last tracing Confirmed by Terrilee Dudzik  MD, Kellin Fifer ([id number]) on 01/10/2014 11:45:27 AM            MDM   Final diagnoses:  Sinusitis  Atypical chest pain    Pt is a 33 y.o. female with Pmhx as above who presents with multiple complaints including 2 weeks of frontal, pressure like h/a over sinuses with assoc nasal congestion, post-nasal gtt, cough, that improves with advil and OTC allergy meds, as well as 2 weeks of constant mid sternal chest heaviness w/o aggrevating to alleviating factors. On PE, VSS, pt in NAD. No focal neuro findings. Suspect h/a is due to sinusitis.  Given 2 weeks of symptoms, will treat w/ Augmentin & rec continued supportive care w/ NSAIDs and antihistamines.  CP is atypical. Trop negative, CXR nml, BNP not significantly elevated. EKG w/o acute ischemic changes. Doubt ACS, and PE. PERC score of 0. Will rec trial of omeprazole which pt has done well on in the past.  Have rec she est caer w/ local PCP.  Return precautions given for new or worsening symptoms including worsening pain, focal neuro complaints, SOB, fever.           Shanna CiscoMegan E Clovis Warwick, MD 01/10/14 1154

## 2014-01-09 NOTE — ED Notes (Signed)
Pt states headache x 2 weeks.  Pt states ear pain with nausea.  Vomited x 2 days ago.  Fever at home.  Pt with cough/congestion.  Pt also states she is having trouble with her reflux.

## 2014-01-09 NOTE — Discharge Instructions (Signed)
Chest Pain (Nonspecific) °It is often hard to give a specific diagnosis for the cause of chest pain. There is always a chance that your pain could be related to something serious, such as a heart attack or a blood clot in the lungs. You need to follow up with your caregiver for further evaluation. °CAUSES  °· Heartburn. °· Pneumonia or bronchitis. °· Anxiety or stress. °· Inflammation around your heart (pericarditis) or lung (pleuritis or pleurisy). °· A blood clot in the lung. °· A collapsed lung (pneumothorax). It can develop suddenly on its own (spontaneous pneumothorax) or from injury (trauma) to the chest. °· Shingles infection (herpes zoster virus). °The chest wall is composed of bones, muscles, and cartilage. Any of these can be the source of the pain. °· The bones can be bruised by injury. °· The muscles or cartilage can be strained by coughing or overwork. °· The cartilage can be affected by inflammation and become sore (costochondritis). °DIAGNOSIS  °Lab tests or other studies, such as X-rays, electrocardiography, stress testing, or cardiac imaging, may be needed to find the cause of your pain.  °TREATMENT  °· Treatment depends on what may be causing your chest pain. Treatment may include: °· Acid blockers for heartburn. °· Anti-inflammatory medicine. °· Pain medicine for inflammatory conditions. °· Antibiotics if an infection is present. °· You may be advised to change lifestyle habits. This includes stopping smoking and avoiding alcohol, caffeine, and chocolate. °· You may be advised to keep your head raised (elevated) when sleeping. This reduces the chance of acid going backward from your stomach into your esophagus. °· Most of the time, nonspecific chest pain will improve within 2 to 3 days with rest and mild pain medicine. °HOME CARE INSTRUCTIONS  °· If antibiotics were prescribed, take your antibiotics as directed. Finish them even if you start to feel better. °· For the next few days, avoid physical  activities that bring on chest pain. Continue physical activities as directed. °· Do not smoke. °· Avoid drinking alcohol. °· Only take over-the-counter or prescription medicine for pain, discomfort, or fever as directed by your caregiver. °· Follow your caregiver's suggestions for further testing if your chest pain does not go away. °· Keep any follow-up appointments you made. If you do not go to an appointment, you could develop lasting (chronic) problems with pain. If there is any problem keeping an appointment, you must call to reschedule. °SEEK MEDICAL CARE IF:  °· You think you are having problems from the medicine you are taking. Read your medicine instructions carefully. °· Your chest pain does not go away, even after treatment. °· You develop a rash with blisters on your chest. °SEEK IMMEDIATE MEDICAL CARE IF:  °· You have increased chest pain or pain that spreads to your arm, neck, jaw, back, or abdomen. °· You develop shortness of breath, an increasing cough, or you are coughing up blood. °· You have severe back or abdominal pain, feel nauseous, or vomit. °· You develop severe weakness, fainting, or chills. °· You have a fever. °THIS IS AN EMERGENCY. Do not wait to see if the pain will go away. Get medical help at once. Call your local emergency services (911 in [place] Do not drive yourself to the hospital. °MAKE SURE YOU:  °· Understand these instructions. °· Will watch your condition. °· Will get help right away if you are not doing well or get worse. °Document Released: 08/14/2005 Document Revised: 01/27/2012 Document Reviewed: 06/09/2008 °ExitCare® Patient Information ©2014 ExitCare,   LLC. Sinusitis Sinusitis is redness, soreness, and swelling (inflammation) of the paranasal sinuses. Paranasal sinuses are air pockets within the bones of your face (beneath the eyes, the middle of the forehead, or above the eyes). In healthy paranasal sinuses, mucus is able to drain out, and air is able to circulate  through them by way of your nose. However, when your paranasal sinuses are inflamed, mucus and air can become trapped. This can allow bacteria and other germs to grow and cause infection. Sinusitis can develop quickly and last only a short time (acute) or continue over a long period (chronic). Sinusitis that lasts for more than 12 weeks is considered chronic.  CAUSES  Causes of sinusitis include:  Allergies.  Structural abnormalities, such as displacement of the cartilage that separates your nostrils (deviated septum), which can decrease the air flow through your nose and sinuses and affect sinus drainage.  Functional abnormalities, such as when the small hairs (cilia) that line your sinuses and help remove mucus do not work properly or are not present. SYMPTOMS  Symptoms of acute and chronic sinusitis are the same. The primary symptoms are pain and pressure around the affected sinuses. Other symptoms include:  Upper toothache.  Earache.  Headache.  Bad breath.  Decreased sense of smell and taste.  A cough, which worsens when you are lying flat.  Fatigue.  Fever.  Thick drainage from your nose, which often is green and may contain pus (purulent).  Swelling and warmth over the affected sinuses. DIAGNOSIS  Your caregiver will perform a physical exam. During the exam, your caregiver may:  Look in your nose for signs of abnormal growths in your nostrils (nasal polyps).  Tap over the affected sinus to check for signs of infection.  View the inside of your sinuses (endoscopy) with a special imaging device with a light attached (endoscope), which is inserted into your sinuses. If your caregiver suspects that you have chronic sinusitis, one or more of the following tests may be recommended:  Allergy tests.  Nasal culture A sample of mucus is taken from your nose and sent to a lab and screened for bacteria.  Nasal cytology A sample of mucus is taken from your nose and examined by  your caregiver to determine if your sinusitis is related to an allergy. TREATMENT  Most cases of acute sinusitis are related to a viral infection and will resolve on their own within 10 days. Sometimes medicines are prescribed to help relieve symptoms (pain medicine, decongestants, nasal steroid sprays, or saline sprays).  However, for sinusitis related to a bacterial infection, your caregiver will prescribe antibiotic medicines. These are medicines that will help kill the bacteria causing the infection.  Rarely, sinusitis is caused by a fungal infection. In theses cases, your caregiver will prescribe antifungal medicine. For some cases of chronic sinusitis, surgery is needed. Generally, these are cases in which sinusitis recurs more than 3 times per year, despite other treatments. HOME CARE INSTRUCTIONS   Drink plenty of water. Water helps thin the mucus so your sinuses can drain more easily.  Use a humidifier.  Inhale steam 3 to 4 times a day (for example, sit in the bathroom with the shower running).  Apply a warm, moist washcloth to your face 3 to 4 times a day, or as directed by your caregiver.  Use saline nasal sprays to help moisten and clean your sinuses.  Take over-the-counter or prescription medicines for pain, discomfort, or fever only as directed by your caregiver. SEEK  IMMEDIATE MEDICAL CARE IF:  You have increasing pain or severe headaches.  You have nausea, vomiting, or drowsiness.  You have swelling around your face.  You have vision problems.  You have a stiff neck.  You have difficulty breathing. MAKE SURE YOU:   Understand these instructions.  Will watch your condition.  Will get help right away if you are not doing well or get worse. Document Released: 11/04/2005 Document Revised: 01/27/2012 Document Reviewed: 11/19/2011 [facility] Patient Information 2014 [place], [place].   Emergency Department Resource Guide 1) Find a Doctor and Pay Out of  Pocket Although you won't have to find out who is covered by your insurance plan, it is a good idea to ask around and get recommendations. You will then need to call the office and see if the doctor you have chosen will accept you as a new patient and what types of options they offer for patients who are self-pay. Some doctors offer discounts or will set up payment plans for their patients who do not have insurance, but you will need to ask so you aren't surprised when you get to your appointment.  2) Contact Your Local Health Department Not all health departments have doctors that can see patients for sick visits, but many do, so it is worth a call to see if yours does. If you don't know where your local health department is, you can check in your phone book. The CDC also has a tool to help you locate your state's health department, and many state websites also have listings of all of their local health departments.  3) Find a Walk-in Clinic If your illness is not likely to be very severe or complicated, you may want to try a walk in clinic. These are popping up all over the country in pharmacies, drugstores, and shopping centers. They're usually staffed by nurse practitioners or physician assistants that have been trained to treat common illnesses and complaints. They're usually fairly quick and inexpensive. However, if you have serious medical issues or chronic medical problems, these are probably not your best option.  No Primary Care Doctor: - Call Health Connect at  [phone] - they can help you locate a primary care doctor that  accepts your insurance, provides certain services, etc. - Physician Referral Service- [phone]  Chronic Pain Problems: Organization         Address  Phone   Notes  [facility]  [phone] Patients need to be referred by their primary care doctor.   Medication Assistance: Organization         Address  Phone   Notes  [facility]  Medication [facility] [address] [place], [place] [address] [phone] --Must be a resident of [facility] -- Must have NO insurance coverage whatsoever (no Medicaid/ Medicare, etc.) -- The pt. MUST have a primary care doctor that directs their care regularly and follows them in the community   MedAssist  [phone]   [employer]  [phone]    Agencies that provide inexpensive medical care: Organization         Address  Phone   Notes  [facility]  [phone]   [facility]    [phone]   [facility] [address] [place], [place] [address] [phone]   [facility] [address] [place] [place] [phone]   Planned Parenthood    [phone]  [facility]    [phone]   [facility]  [address], [place] Phone:  [phone], Fax:  [phone] Hours of Operation:  9 am - 6 pm, M-F.  Also accepts Medicaid/Medicare and self-pay.  [facility] [place] [facility]  [address], [place] Phone: [phone], Fax: [phone]. Hours of Operation:  8:30 am - 5:30 pm, M-F.  Also accepts Medicaid and self-pay.  [facility] [place] [address] [place] Phone: [phone]   [facility] [address] Natasha Bence [place], [place] [phone], Ext. [phone] Mondays & Thursdays: 7-9 AM.  First 15 patients are seen on a first come, first serve basis.    Medicaid-accepting [facility] Providers:  Organization         Address  Phone   Notes  [facility] [address], [place] [phone] Also accepts self-pay patients.  [facility] [address] Laurell Josephs [place], [place]  [phone]   [facility] [address], [place] [phone]   [facility] [address], [place] [phone]   Renaye Rakers [address], [place]   [phone] Only accepts [place] patients after they have their name applied to their card.   Self-Pay (no insurance) in [facility]:  Organization         Address  Phone   Notes  Sickle Cell Patients, [facility] Internal Medicine [address] [place], [place] [phone]   [facility] [address] [place], [place] [phone]   [facility] [place]  [address], [place] [phone]   [facility]/Dr. Osei-Bonsu  [address] [place] or [address], [place] [phone] Phone number for both [place] and [place] locations is the same.  [facility] [address], [place] [phone]   [facility] [address] [place] or [address] [phone]   [facility] [address] [place] [phone], phone; [phone], fax Sees patients 1st and 3rd Saturday of every month.  Must not qualify for public or private insurance (i.e. Medicaid, Medicare, [place] Health Choice, Veterans' Benefits)  Household income should be no more than 200% of the poverty level The clinic cannot treat you if you are pregnant or think you are pregnant  Sexually transmitted diseases are not treated at the clinic.    Dental Care: Organization         Address  Phone  Notes  [facility] Department of [facility] [address] [place], [place] [phone] Accepts children up to age 53 who are enrolled in [place] or [place] Health Choice; pregnant women with a Medicaid card; and children who have applied for Medicaid or [place] Health Choice, but were declined, whose parents can pay a reduced fee at time of service.  [facility]  [address], [place]  [phone] Accepts children up to age 31 who are enrolled in [place] or [place] Health Choice; pregnant women with a Medicaid card; and children who have applied for Medicaid or [place] Health Choice, but were declined, whose parents can pay a reduced fee at time  of service.  [facility]  [address] [place], [place] [phone] Patients are seen by appointment only. Walk-ins are not accepted. [facility] will see patients 86 years of age and older. Monday - Tuesday (8am-5pm) Most Wednesdays (8:30-5pm) $30 per visit, cash only  [facility]  [address], [facility] [phone] Patients are seen by appointment only. Walk-ins are not accepted. [facility] will see patients 39 years of age and older. One Wednesday Evening (Monthly: Volunteer Based).  $30 per visit, cash only  [employer]  [phone] for adults; Children under age 12, call Graduate Pediatric Dentistry at [phone]. Children aged 56-14, please call [phone] to request a pediatric application.  Dental services are provided in all areas of dental care including fillings, crowns and bridges, complete and partial dentures, implants, gum treatment, root canals, and extractions. Preventive care is also provided. Treatment is provided to both adults and children. Patients are selected via a lottery and there is often a waiting list.   [facility] [address] [place]  [phone] [email or web address]   [facility] [address] [place], [place] [phone], Ext. [phone] Second and Fourth Thursday of each month, opens at 6:30 AM; Clinic ends at 9 AM.  Patients are seen on a first-come first-served basis, and a limited number are seen during each clinic.   [facility]  [address] Ether Griffins [place], [place] [phone]   Eligibility Requirements You must have lived in [place], [place], or [place]  counties for at least the last three months.   You cannot be eligible for state or federal sponsored [place], including [employer], [place], or [employer].   You generally cannot be eligible for healthcare insurance through your employer.    How to apply: Eligibility screenings are held every Tuesday and Wednesday afternoon from 1:00 pm until 4:00 pm. You do not need an appointment for the interview!  [facility] [address] [place], [place] [phone]   [facility] Health Department  [phone]   [facility] Health Department  [phone]   [facility] Health Department  [phone]    Behavioral Health Resources in the Community: Intensive Outpatient Programs Organization         Address  Phone  Notes  [facility] [address] [place], [place] [phone]   [facility] Outpatient [address] [place], [place] [phone]   [facility] [address], [place], [place]  [phone]   [facility] Mental Health [address]  [place], [place] [phone] or [phone]   Substance Abuse Resources Organization         Address  Phone  Notes  [facility] Drug Services  [phone]   [facility]  [phone]   [facility] [place]  [phone]   Floydene Flock  [phone]   Residential & Outpatient Substance Abuse Program  [phone]   Psychological Services Organization         Address  Phone  Notes  [facility] Behavioral Health  [phone]   [facility]  [phone]   [facility] Mental Health [address], [place] [phone] or [phone]    Mobile Crisis Teams Organization         Address  Phone  Notes  [facility], Mobile Crisis Care Unit  [phone]   [facility]  [address] [place], [place] [phone]   [facility] [address] [place] [phone]    Self-Help/Support Groups Organization         Address  Phone             Notes  [facility] [place] - variety of support groups  336- [phone] Call for more information  Narcotics Anonymous (NA), [facility] [address], [place]  2 meetings at this location   Statisticianesidential Treatment Programs Organization         Address  Phone  Notes  [facility] [address],    [place]  [phone]   [facility]  [address], [place] [phone], [place], [place] [phone]   [facility] [address], [place] [phone] Admissions: 8am-3pm M-F  [facility] [address]    [place], [place] [phone]   [facility] [address], [place], [place] [phone]   [facility] [address]  [place], [place] [phone]   [facility] - Intensive Outpatient [address], [place], [place] [phone]   [facility] ([facility] [address]  [place], [place] [phone] or [phone]   [facility] ([facility]) [address] [place], [place] [phone] Accepts Medicaid  [facility] [address]  [place] [phone] Substance Abuse/Addiction Treatment   [facility] Behavioral Health Resources Organization         Address  Phone  Notes  [facility]  [phone]) [phone]   Angie FavaJulie Brannon, PhD [address] [place], [place]   [phone] or [phone]) [phone]   [facility]    [address] [place], [place] [phone](336) [phone]   [facility] [address], [place], [place] [phone] Insurance/Medicaid/sponsorship through [facility]  [facility] [address]                                    [place], [place] [phone] Therapy/tele-psych/case  [facility] [address]   [place], [place] [phone]) [phone]    Dr. Lolly MustacheArfeen  [phone](336)  [phone]   [facility]  [employer] [facility] 1) [address], [place] 2) [address], [place] 3)  [address], [place] [phone]) [phone]  [phone]   [facility] Child Abuse Hotline [phone]) [phone] or [phone](336) [phone] (After Hours)

## 2014-09-19 ENCOUNTER — Encounter (HOSPITAL_COMMUNITY): Payer: Self-pay | Admitting: Emergency Medicine

## 2014-12-26 IMAGING — CR DG CHEST 2V
2 series · 2 of 2 positions shown · non-contrast
Comparison: 04/28/2013

CLINICAL DATA: Weakness with cough

EXAM:
CHEST  2 VIEW

[w chest pa]
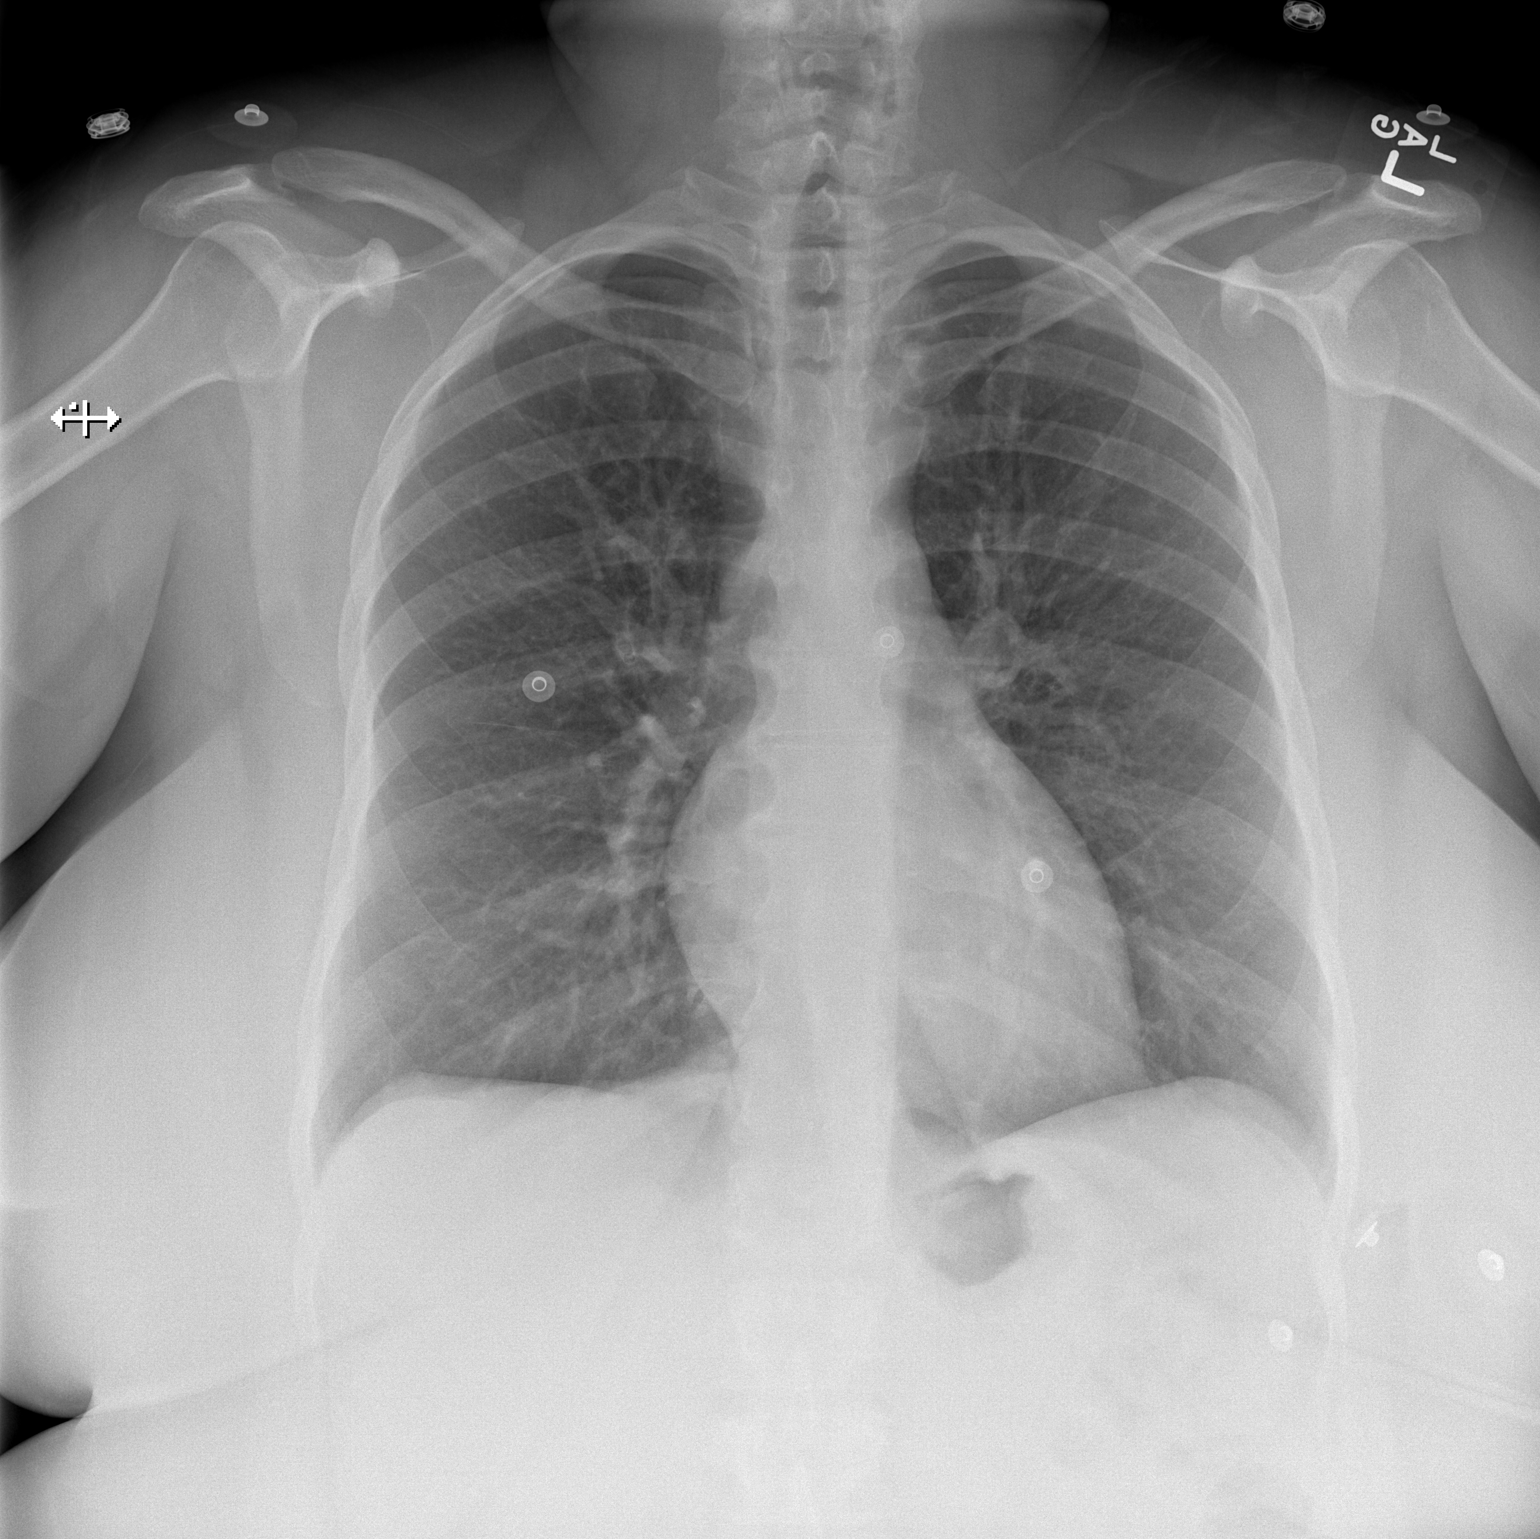

[w chest lat]
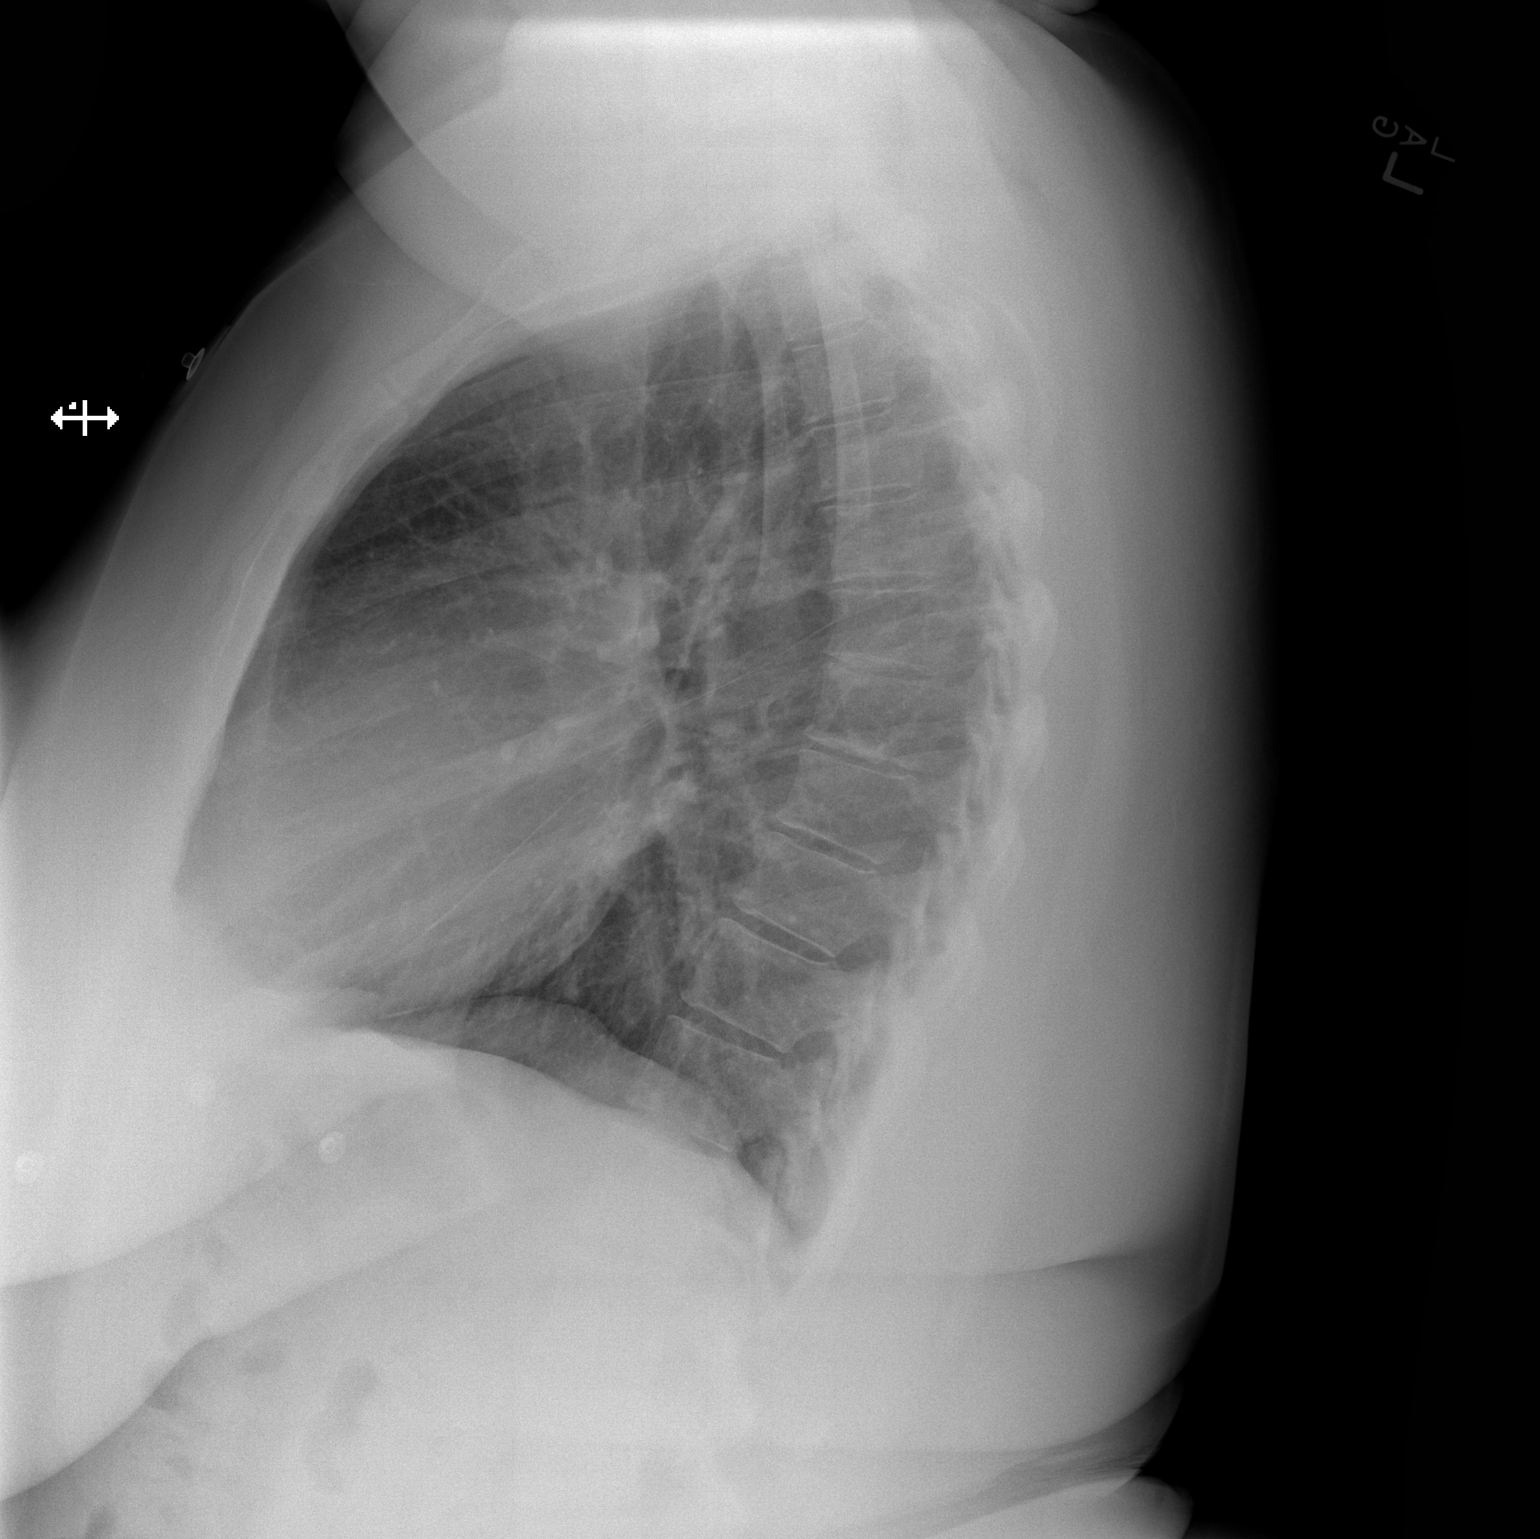

[2 of 2 positions shown; findings below may reference images not displayed]

FINDINGS: The heart size and mediastinal contours are within normal limits.
Both lungs are clear. The visualized skeletal structures are
unremarkable.
IMPRESSION: No active cardiopulmonary disease.

## 2015-02-20 ENCOUNTER — Encounter (HOSPITAL_COMMUNITY): Payer: Self-pay | Admitting: Emergency Medicine

## 2015-02-20 ENCOUNTER — Emergency Department (HOSPITAL_COMMUNITY)
Admission: EM | Admit: 2015-02-20 | Discharge: 2015-02-20 | Disposition: A | Discharge status: Home / Self Care | Payer: Medicaid Other | Attending: Emergency Medicine | Admitting: Emergency Medicine

## 2015-02-20 DIAGNOSIS — Z87891 Personal history of nicotine dependence: Secondary | ICD-10-CM | POA: Insufficient documentation

## 2015-02-20 DIAGNOSIS — K219 Gastro-esophageal reflux disease without esophagitis: Secondary | ICD-10-CM | POA: Insufficient documentation

## 2015-02-20 DIAGNOSIS — Z79899 Other long term (current) drug therapy: Secondary | ICD-10-CM | POA: Insufficient documentation

## 2015-02-20 DIAGNOSIS — L0100 Impetigo, unspecified: Secondary | ICD-10-CM | POA: Diagnosis not present

## 2015-02-20 DIAGNOSIS — R21 Rash and other nonspecific skin eruption: Secondary | ICD-10-CM | POA: Diagnosis present

## 2015-02-20 DIAGNOSIS — E669 Obesity, unspecified: Secondary | ICD-10-CM | POA: Insufficient documentation

## 2015-02-20 DIAGNOSIS — J45909 Unspecified asthma, uncomplicated: Secondary | ICD-10-CM | POA: Insufficient documentation

## 2015-02-20 MED ORDER — MUPIROCIN CALCIUM 2 % EX CREA: 1.0000 "application " | TOPICAL_CREAM | Freq: Three times a day (TID) | CUTANEOUS | Status: DC

## 2015-02-20 MED ORDER — HYDROCORTISONE 1 % EX CREA: TOPICAL_CREAM | CUTANEOUS | Status: DC

## 2015-02-20 MED ORDER — MUPIROCIN CALCIUM 2 % EX CREA: 1.0000 "application " | TOPICAL_CREAM | Freq: Three times a day (TID) | CUTANEOUS | Status: AC

## 2015-02-20 NOTE — Discharge Instructions (Signed)
Please follow the directions provided. Be sure to follow-up with your primary care doctor to ensure its getting better. Please cleanse the affected area 2-3 times a day with warm soapy water and apply a dressing with the antibiotic ointment until the lesions have resolved. May use hydrocortisone cream on the eczema rash on your hands. Don't hesitate to return for any new, worsening, or concerning symptoms.   SEEK IMMEDIATE MEDICAL CARE IF:  The infection appears to be getting worse. Signs include:  Increased warmth, redness, or tenderness around the wound site.  A red line that extends from the infection site.  A dark color in the area around the infection.  Wound drainage that is tan, yellow, or green.  A bad smell coming from the wound. You feel sick to your stomach (nauseous) and throw up (vomit) or cannot keep medicine down.  You have a fever.

## 2015-02-20 NOTE — ED Notes (Addendum)
Pt with Hx of eczema c/o rash onset last night over tattoo that she had done on her arm 2 days ago. Skin is dry, crusted, whitened, and itching over tattoo ink with scattered areas of dried yellow pus, and edematous, warm, with erythema present to surrounding skin. Dried skin patches present to hands from prior eczema outbreak.

## 2015-02-20 NOTE — ED Provider Notes (Signed)
CSN: [id number]     Arrival date & time 02/20/15  1231 History  This chart was scribed for non-physician practitioner, Harle BattiestElizabeth Nesiah Jump, working with Arby BarretteMarcy Pfeiffer, MD by Richarda Overlieichard Holland, ED Scribe. This patient was seen in room WTR6/WTR6 and the patient's care was started at 1:42 PM.   Chief Complaint  Patient presents with  . Rash   The history is provided by the patient. No language interpreter was used.   HPI Comments: Melissa Guerra is a 34 y.o. female with a history of asthma and eczema who presents to the Emergency Department complaining of rash to her left arm that started last night. Pt states she got a new tattoo two days ago on her left upper arm. She reports some associated itching to the area and some mild pain but says the pain may be attributed to the new tattoo. Pt reports no modifying or alleviating factors at this time. Pt reports she is allergic to codeine.   Past Medical History  Diagnosis Date  . Asthma   . Abnormal Pap smear   . Obesity   . Acid reflux    Past Surgical History  Procedure Laterality Date  . Tonsillectomy    . Tonsillectomy and adenoidectomy    . Foot surgery    . Tonsillectomy and adenoidectomy    . Wisdom tooth extraction     History reviewed. No pertinent family history. History  Substance Use Topics  . Smoking status: Former Smoker    Types: Cigarettes  . Smokeless tobacco: Never Used  . Alcohol Use: No   OB History    Gravida Para Term Preterm AB TAB SAB Ectopic Multiple Living   2 2             Review of Systems  Skin: Positive for rash.  All other systems reviewed and are negative.  Allergies  Codeine and Peanut-containing drug products  Home Medications   Prior to Admission medications   Medication Sig Start Date End Date Taking? Authorizing Provider  albuterol (PROVENTIL HFA;VENTOLIN HFA) 108 (90 BASE) MCG/ACT inhaler Inhale 2 puffs into the lungs every 6 (six) hours as needed for wheezing or shortness of breath.      Historical Provider, MD  amoxicillin-clavulanate (AUGMENTIN) 875-125 MG per tablet Take 1 tablet by mouth every 12 (twelve) hours. 01/09/14   Toy CookeyMegan Docherty, MD  cetirizine (ZYRTEC) 10 MG tablet Take 10 mg by mouth daily.    Historical Provider, MD  EPINEPHrine (EPIPEN JR) 0.15 MG/0.3 ML injection Inject 0.15 mg into the muscle daily as needed for anaphylaxis.    Historical Provider, MD  ibuprofen (ADVIL,MOTRIN) 200 MG tablet Take 400 mg by mouth every 6 (six) hours as needed for mild pain or moderate pain.    Historical Provider, MD  omeprazole (PRILOSEC) 20 MG capsule Take 1 capsule (20 mg total) by mouth 2 (two) times daily before a meal. 01/09/14   Toy CookeyMegan Docherty, MD  pseudoephedrine-acetaminophen (TYLENOL SINUS) 30-500 MG TABS Take 1 tablet by mouth every 4 (four) hours as needed (congestion).    Historical Provider, MD   BP 118/85 mmHg  Pulse 95  Temp(Src) 98 F (36.7 C) (Oral)  Resp 18  SpO2 100% Physical Exam  Constitutional: She is oriented to person, place, and time. She appears well-developed and well-nourished.  HENT:  Head: Normocephalic and atraumatic.  Eyes: Right eye exhibits no discharge. Left eye exhibits no discharge.  Neck: Neck supple. No tracheal deviation present.  Cardiovascular: Normal rate.   Pulmonary/Chest:  Effort normal. No respiratory distress.  Abdominal: She exhibits no distension.  Musculoskeletal:  Honey color crusted lesions with mild drainage surrounding the outline of a heart shaped tattoo around the left anterior upper arm. Directly above the left lateral upper arm mild erythema surrounding the lesions. No abscess noted.   Neurological: She is alert and oriented to person, place, and time.  Skin: Skin is warm and dry.  Psychiatric: She has a normal mood and affect. Her behavior is normal.  Nursing note and vitals reviewed.   ED Course  Procedures   DIAGNOSTIC STUDIES: Oxygen Saturation is 100% on RA, normal by my interpretation.    COORDINATION  OF CARE: 1:47 PM Discussed treatment plan with pt at bedside and pt agreed to plan.   Labs Review Labs Reviewed - No data to display  Imaging Review No results found.   EKG Interpretation None      MDM   Final diagnoses:  Impetigo   34 yo with exam consistent with impetigo isolated to one new tattoo.  Discussed cleaning with warm soapy water, applying prescription ointment and covering until infection heals.  Pt also requested steroids for impending eczema on hands, hydrocortisone cream prescription provided. No concern for bullous impetigo or spread of infection. Pt is well-appearing, in no acute distress and vital signs reviewed and not concerning. She appears safe to be discharged.  Discharge include follow-up with their PCP.  Return precautions provided. Pt aware of plan and in agreement.   I personally performed the services described in this documentation, which was scribed in my presence. The recorded information has been reviewed and is accurate.  Filed Vitals:   02/20/15 1241 02/20/15 1416  BP: 118/85 124/73  Pulse: 95 89  Temp: 98 F (36.7 C)   TempSrc: Oral   Resp: 18 20  SpO2: 100% 99%   Meds given in ED:  Medications - No data to display  Discharge Medication List as of 02/20/2015  1:53 PM     02/20/15 0000  mupirocin cream (BACTROBAN) 2 % 3 times daily Discontinue Reprint 02/20/15 1359   02/20/15 0000  hydrocortisone cream 1 % Discontinue Reprint 02/20/15 1359            Harle Battiest, NP 02/22/15 1610  Arby Barrette, MD 02/24/15 2111

## 2017-05-22 ENCOUNTER — Emergency Department (HOSPITAL_COMMUNITY)
Admission: EM | Admit: 2017-05-22 | Discharge: 2017-05-22 | Disposition: A | Discharge status: Home / Self Care | Payer: Medicaid Other | Attending: Emergency Medicine | Admitting: Emergency Medicine

## 2017-05-22 ENCOUNTER — Encounter (HOSPITAL_COMMUNITY): Payer: Self-pay | Admitting: Emergency Medicine

## 2017-05-22 DIAGNOSIS — Z79899 Other long term (current) drug therapy: Secondary | ICD-10-CM | POA: Insufficient documentation

## 2017-05-22 DIAGNOSIS — R519 Headache, unspecified: Secondary | ICD-10-CM

## 2017-05-22 DIAGNOSIS — R51 Headache: Secondary | ICD-10-CM | POA: Diagnosis present

## 2017-05-22 DIAGNOSIS — J0191 Acute recurrent sinusitis, unspecified: Secondary | ICD-10-CM | POA: Diagnosis not present

## 2017-05-22 DIAGNOSIS — Z7951 Long term (current) use of inhaled steroids: Secondary | ICD-10-CM | POA: Insufficient documentation

## 2017-05-22 DIAGNOSIS — Z91010 Allergy to peanuts: Secondary | ICD-10-CM | POA: Insufficient documentation

## 2017-05-22 DIAGNOSIS — Z87891 Personal history of nicotine dependence: Secondary | ICD-10-CM | POA: Insufficient documentation

## 2017-05-22 DIAGNOSIS — J45909 Unspecified asthma, uncomplicated: Secondary | ICD-10-CM | POA: Diagnosis not present

## 2017-05-22 DIAGNOSIS — R109 Unspecified abdominal pain: Secondary | ICD-10-CM | POA: Diagnosis not present

## 2017-05-22 LAB — CBC
HEMATOCRIT: 31.6 % — AB (ref 36.0–46.0)
Hemoglobin: 10 g/dL — ABNORMAL LOW (ref 12.0–15.0)
MCH: 24.9 pg — AB (ref 26.0–34.0)
MCHC: 31.6 g/dL (ref 30.0–36.0)
MCV: 78.6 fL (ref 78.0–100.0)
Platelets: 324 10*3/uL (ref 150–400)
RBC: 4.02 MIL/uL (ref 3.87–5.11)
RDW: 15.5 % (ref 11.5–15.5)
WBC: 7.6 10*3/uL (ref 4.0–10.5)

## 2017-05-22 LAB — COMPREHENSIVE METABOLIC PANEL
ALK PHOS: 76 U/L (ref 38–126)
ALT: 17 U/L (ref 14–54)
AST: 20 U/L (ref 15–41)
Albumin: 3.9 g/dL (ref 3.5–5.0)
Anion gap: 7 (ref 5–15)
BUN: 11 mg/dL (ref 6–20)
CALCIUM: 8.8 mg/dL — AB (ref 8.9–10.3)
CO2: 24 mmol/L (ref 22–32)
CREATININE: 0.62 mg/dL (ref 0.44–1.00)
Chloride: 111 mmol/L (ref 101–111)
GFR calc Af Amer: >60 mL/min (ref >60)
Glucose, Bld: 103 mg/dL — ABNORMAL HIGH (ref 65–99)
Potassium: 3.4 mmol/L — ABNORMAL LOW (ref 3.5–5.1)
Sodium: 142 mmol/L (ref 135–145)
TOTAL PROTEIN: 7.2 g/dL (ref 6.5–8.1)
Total Bilirubin: 0.7 mg/dL (ref 0.3–1.2)

## 2017-05-22 LAB — I-STAT BETA HCG BLOOD, ED (MC, WL, AP ONLY): I-stat hCG, quantitative: <5 m[IU]/mL (ref <5)

## 2017-05-22 LAB — LIPASE, BLOOD: Lipase: 16 U/L (ref 11–51)

## 2017-05-22 LAB — URINALYSIS, ROUTINE W REFLEX MICROSCOPIC
BILIRUBIN URINE: NEGATIVE
Glucose, UA: NEGATIVE mg/dL
HGB URINE DIPSTICK: NEGATIVE
KETONES UR: 5 mg/dL — AB
Leukocytes, UA: NEGATIVE
NITRITE: NEGATIVE
PH: 5 (ref 5.0–8.0)
Protein, ur: NEGATIVE mg/dL
SPECIFIC GRAVITY, URINE: 1.027 (ref 1.005–1.030)

## 2017-05-22 MED ORDER — NAPROXEN 500 MG PO TABS
500.0000 mg | ORAL_TABLET | Freq: Once | ORAL | Status: AC
  Administered 2017-05-22: 500 mg via ORAL
  Filled 2017-05-22: qty 1

## 2017-05-22 MED ORDER — AZITHROMYCIN 250 MG PO TABS: 250.0000 mg | ORAL_TABLET | Freq: Every day | ORAL | 0 refills | Status: DC

## 2017-05-22 NOTE — Discharge Instructions (Signed)
1. Follow up with ear, nose and throat doctor. 2. Follow up with primary care provider about your other symptoms. Return to ER if any severe vomiting, fever, worsening abdominal pain, or bloody stools. 3. See an eye doctor for a dilated eye exam.

## 2017-05-22 NOTE — ED Triage Notes (Signed)
Pt's main concern is left anterior headache around eyes, ears, radiating to left lateral neck onset yesterday. Intermittent blurriness to peripheral vision of left eye, no blurriness currently. Clear and yellow rhinorrhea, feels mucus dripping down throat.   Hx multiple sinus infections recently but current symptoms are worse than before.  Also c/o abdominal pain, nausea x 4 days. Hasn't been eating due to nausea.  Bilateral frontal and maxillary sinuses tender to palpation. Right TM pearly gray, cone of light in appropriate position.

## 2017-05-23 NOTE — ED Provider Notes (Signed)
[facility] Provider Note   CSN: [id number] Arrival date & time: 05/22/17  1644     History   Chief Complaint Chief Complaint  Patient presents with  . Headache    Sinus Tenderness  . Abdominal Pain    HPI Melissa Guerra is a 36 y.o. female.  36 year old female with past medical history below who presents with multiple complaints including headache, abdominal pain, and nausea. Patient reports gradual onset of headache located in her left frontal head, around her eyes and ears and radiating to her sides of neck that began yesterday and has persisted today. She endorses at least 1 week of rhinorrhea, postnasal drip, and some congestion. No cough. She does take daily allergy medications. She reports a history of recurrent sinusitis and states that this headache feels similar but it is more intense than she has ever had.  Patient also complains of one week of intermittent lower abdominal pain that she describes as a "pulling." She reports nausea and not being able to eat much, had an episode of vomiting 2 days ago but none since then. No diarrhea. No blood in her stool. She denies any urinary symptoms, vaginal bleeding, or vaginal discharge.   The history is provided by the patient.  Headache    Abdominal Pain   Associated symptoms include headaches.    Past Medical History:  Diagnosis Date  . Abnormal Pap smear   . Acid reflux   . Asthma   . Obesity     Patient Active Problem List   Diagnosis Date Noted  . 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 FOOT SURGERY    . TONSILLECTOMY    . TONSILLECTOMY AND ADENOIDECTOMY    . TONSILLECTOMY AND ADENOIDECTOMY    . WISDOM TOOTH EXTRACTION      OB History    Gravida Para Term Preterm AB Living   2 2           SAB TAB Ectopic Multiple Live Births                   Home Medications    Prior to Admission medications   Medication Sig Start  Date End Date Taking? Authorizing Provider  albuterol (ACCUNEB) 0.63 MG/3ML nebulizer solution Take 1 ampule by nebulization every 6 (six) hours as needed for wheezing or shortness of breath.   Yes [provider]  albuterol (PROVENTIL HFA;VENTOLIN HFA) 108 (90 BASE) MCG/ACT inhaler Inhale 2 puffs into the lungs every 6 (six) hours as needed for wheezing or shortness of breath.    Yes [provider]  beclomethasone (QVAR) 80 MCG/ACT inhaler Inhale 1 puff into the lungs 2 (two) times daily.   Yes [provider]  cetirizine (ZYRTEC) 10 MG tablet Take 10 mg by mouth daily.   Yes [provider]  fluticasone (FLONASE) 50 MCG/ACT nasal spray Place 1 spray into both nostrils daily.   Yes [provider]  ibuprofen (ADVIL,MOTRIN) 200 MG tablet Take 400 mg by mouth every 6 (six) hours as needed for mild pain or moderate pain.   Yes [provider]  montelukast (SINGULAIR) 10 MG tablet Take 10 mg by mouth at bedtime.   Yes [provider]  omeprazole (PRILOSEC) 20 MG capsule Take 1 capsule (20 mg total) by mouth 2 (two) times daily before a meal. 01/09/14  Yes Toy Cookeyocherty, Megan, MD  Vitamin D, Ergocalciferol, (DRISDOL) 50000 units CAPS  capsule Take 50,000 Units by mouth every 7 (seven) days.   Yes [provider]  amoxicillin-clavulanate (AUGMENTIN) 875-125 MG per tablet Take 1 tablet by mouth every 12 (twelve) hours. Patient not taking: Reported on 05/22/2017 01/09/14   Toy Cookey, MD  azithromycin (ZITHROMAX) 250 MG tablet Take 1 tablet (250 mg total) by mouth daily. Take first 2 tablets together, then 1 every day until finished. 05/22/17   Little, Ambrose Finland, MD  hydrocortisone cream 1 % Apply to affected area 2 times daily Patient not taking: Reported on 05/22/2017 02/20/15   Harle Battiest, NP  pseudoephedrine-acetaminophen (TYLENOL SINUS) 30-500 MG TABS Take 1 tablet by mouth every 4 (four) hours as needed (congestion).     [provider]    Family History History reviewed. No pertinent family history.  Social History Social History  Substance Use Topics  . Smoking status: Former Smoker    Types: Cigarettes  . Smokeless tobacco: Never Used  . Alcohol use No     Allergies   Codeine and Peanut-containing drug products   Review of Systems Review of Systems  Gastrointestinal: Positive for abdominal pain.  Neurological: Positive for headaches.   All other systems reviewed and are negative except that which was mentioned in HPI   Physical Exam Updated Vital Signs BP 132/71 (BP Location: Right Arm)   Pulse 77   Temp 98 F (36.7 C)   Resp 18   SpO2 100%   Physical Exam  Constitutional: She is oriented to person, place, and time. She appears well-developed and well-nourished. No distress.  Awake, alert  HENT:  Head: Normocephalic and atraumatic.  Right Ear: Tympanic membrane and ear canal normal.  Left Ear: Tympanic membrane and ear canal normal.  Mild tenderness of frontal and b/l maxillary sinuses  Eyes: Conjunctivae and EOM are normal. Pupils are equal, round, and reactive to light.  Neck: Neck supple.  Cardiovascular: Normal rate, regular rhythm and normal heart sounds.   No murmur heard. Pulmonary/Chest: Effort normal and breath sounds normal. No respiratory distress.  Abdominal: Soft. Bowel sounds are normal. She exhibits no distension. There is no tenderness.  Musculoskeletal: She exhibits no edema.  Neurological: She is alert and oriented to person, place, and time. She has normal reflexes. No cranial nerve deficit. She exhibits normal muscle tone.  Fluent speech, normal finger-to-nose testing, negative pronator drift, no clonus 5/5 strength and normal sensation x all 4 extremities  Skin: Skin is warm and dry.  Psychiatric: She has a normal mood and affect. Judgment and thought content normal.  Nursing note and vitals reviewed.    ED Treatments / Results  Labs (all  labs ordered are listed, but only abnormal results are displayed) Labs Reviewed  COMPREHENSIVE METABOLIC PANEL - Abnormal; Notable for the following:       Result Value   Potassium 3.4 (*)    Glucose, Bld 103 (*)    Calcium 8.8 (*)    All other components within normal limits  CBC - Abnormal; Notable for the following:    Hemoglobin 10.0 (*)    HCT 31.6 (*)    MCH 24.9 (*)    All other components within normal limits  URINALYSIS, ROUTINE W REFLEX MICROSCOPIC - Abnormal; Notable for the following:    APPearance HAZY (*)    Ketones, ur 5 (*)    All other components within normal limits  LIPASE, BLOOD  I-STAT BETA HCG BLOOD, ED ([facility], [facility], [facility] ONLY)    EKG  EKG Interpretation  None       Radiology No results found.  Procedures Procedures (including critical care time)  Medications Ordered in ED Medications  naproxen (NAPROSYN) tablet 500 mg (500 mg Oral Given 05/22/17 2127)     Initial Impression / Assessment and Plan / ED Course  I have reviewed the triage vital signs and the nursing notes.  Pertinent labs that were available during my care of the patient were reviewed by me and considered in my medical decision making (see chart for details).     PT w/ Complaint of gradual onset of headache with sinus symptoms. Her description of symptoms does sound consistent with sinusitis. She has had several episodes of sinusitis within the past year and I feel she may benefit from ENT for follow-up given the recurrence and the fact that she is compliant with allergy medications. She has done well with azithromycin in the past, provided her Z-Pak. Gave naproxen for her headache and she was comfortable and well-appearing on reexamination. No sudden onset of severe headache and no neurologic symptoms to suggest acute intracranial process.  Regarding her abdominal pain, her symptoms have been intermittent throughout a 1 week period which is not consistent with appendicitis. She is afebrile  here, all lab work is unremarkable with normal WBC count, no evidence of infection on UA, normal LFTs and lipase. I've instructed her to follow-up with PCP. Extensively reviewed return precautions. Patient voiced understanding and was discharged in satisfactory condition.  Final Clinical Impressions(s) / ED Diagnoses   Final diagnoses:  Sinus headache  Abdominal pain, unspecified abdominal location  Acute recurrent sinusitis, unspecified location    New Prescriptions Discharge Medication List as of 05/22/2017 11:09 PM    START taking these medications   Details  azithromycin (ZITHROMAX) 250 MG tablet Take 1 tablet (250 mg total) by mouth daily. Take first 2 tablets together, then 1 every day until finished., Starting Thu 05/22/2017, Print         Little, Ambrose Finland, MD 05/23/17 [phone]

## 2017-07-01 ENCOUNTER — Ambulatory Visit (HOSPITAL_COMMUNITY)
Admission: EM | Admit: 2017-07-01 | Discharge: 2017-07-01 | Disposition: A | Discharge status: Home / Self Care | Payer: Medicaid Other | Attending: Internal Medicine | Admitting: Internal Medicine

## 2017-07-01 ENCOUNTER — Encounter (HOSPITAL_COMMUNITY): Payer: Self-pay | Admitting: Emergency Medicine

## 2017-07-01 DIAGNOSIS — J45909 Unspecified asthma, uncomplicated: Secondary | ICD-10-CM | POA: Diagnosis not present

## 2017-07-01 DIAGNOSIS — K219 Gastro-esophageal reflux disease without esophagitis: Secondary | ICD-10-CM | POA: Diagnosis not present

## 2017-07-01 DIAGNOSIS — Z3202 Encounter for pregnancy test, result negative: Secondary | ICD-10-CM

## 2017-07-01 DIAGNOSIS — Z87891 Personal history of nicotine dependence: Secondary | ICD-10-CM | POA: Insufficient documentation

## 2017-07-01 DIAGNOSIS — N3090 Cystitis, unspecified without hematuria: Secondary | ICD-10-CM | POA: Insufficient documentation

## 2017-07-01 DIAGNOSIS — R109 Unspecified abdominal pain: Secondary | ICD-10-CM

## 2017-07-01 DIAGNOSIS — J324 Chronic pansinusitis: Secondary | ICD-10-CM | POA: Diagnosis not present

## 2017-07-01 DIAGNOSIS — N871 Moderate cervical dysplasia: Secondary | ICD-10-CM | POA: Insufficient documentation

## 2017-07-01 DIAGNOSIS — E669 Obesity, unspecified: Secondary | ICD-10-CM | POA: Diagnosis not present

## 2017-07-01 DIAGNOSIS — H9202 Otalgia, left ear: Secondary | ICD-10-CM | POA: Diagnosis present

## 2017-07-01 LAB — POCT URINALYSIS DIP (DEVICE)
BILIRUBIN URINE: NEGATIVE
Glucose, UA: NEGATIVE mg/dL
KETONES UR: NEGATIVE mg/dL
NITRITE: NEGATIVE
PH: 7 (ref 5.0–8.0)
PROTEIN: NEGATIVE mg/dL
Specific Gravity, Urine: 1.025 (ref 1.005–1.030)
Urobilinogen, UA: 1 mg/dL (ref 0.0–1.0)

## 2017-07-01 LAB — POCT PREGNANCY, URINE: PREG TEST UR: NEGATIVE

## 2017-07-01 MED ORDER — FLUTICASONE PROPIONATE 50 MCG/ACT NA SUSP: 2.0000 | Freq: Every day | NASAL | 0 refills | Status: DC

## 2017-07-01 MED ORDER — FLUCONAZOLE 150 MG PO TABS: 150.0000 mg | ORAL_TABLET | Freq: Every day | ORAL | 0 refills | Status: DC

## 2017-07-01 MED ORDER — SULFAMETHOXAZOLE-TRIMETHOPRIM 800-160 MG PO TABS: 1.0000 | ORAL_TABLET | Freq: Two times a day (BID) | ORAL | 0 refills | Status: AC

## 2017-07-01 NOTE — Discharge Instructions (Signed)
Your urine is positive for UTI. Start Bactrim as directed. Diflucan called in for yeast infection, take as directed. Flonase for nasal congestion. Follow-up with ENT as recommended. Cytology and urine culture sent, you'll be contacted with any positive results that requires further treatment. Refrain from sexual activity for the next 7 days. Follow-up with PCP for further evaluation needed for abdominal pain. Monitor for worsening of symptoms, nausea, vomiting, fever, go to the emergency department for further evaluation.

## 2017-07-01 NOTE — ED Provider Notes (Signed)
[facility]    CSN: [id number] Arrival date & time: 07/01/17  1114     History   Chief Complaint Chief Complaint  Patient presents with  . Abdominal Pain  . Otalgia    HPI Melissa Guerra is a 36 y.o. female.   36 year old female comes in for multiple complaints. She has had left ear pain for the past month, was evaluated and she did with Z-Pak in the emergency department, the pain persists. She has not been able to follow-up with ENT as advised during that visit. She continues to take her allergy medicines, but has not tried nasal spray. She also has abdominal pain for the past month, which she states could be due to her acid reflux, given known hiatal hernia. She does state she has some lower abdominal pressure. She has had 3-4 days of vaginal discharge with dysuria. Last menstrual cycle 06/09/2017. She sexually active with one female partner. She has some vaginal itching/irritation, no spotting. Denies nausea, vomiting, diarrhea, constipation. Denies fever, chills, night sweats. Denies other URI symptoms such as cough, congestion, sore throat.      Past Medical History:  Diagnosis Date  . Abnormal Pap smear   . Acid reflux   . Asthma   . Obesity     Patient Active Problem List   Diagnosis Date Noted  . 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 FOOT SURGERY    . TONSILLECTOMY    . TONSILLECTOMY AND ADENOIDECTOMY    . TONSILLECTOMY AND ADENOIDECTOMY    . WISDOM TOOTH EXTRACTION      OB History    Gravida Para Term Preterm AB Living   2 2           SAB TAB Ectopic Multiple Live Births                   Home Medications    Prior to Admission medications   Medication Sig Start Date End Date Taking? Authorizing Provider  albuterol (ACCUNEB) 0.63 MG/3ML nebulizer solution Take 1 ampule by nebulization every 6 (six) hours as needed for wheezing or shortness of breath.   Yes  [provider]  albuterol (PROVENTIL HFA;VENTOLIN HFA) 108 (90 BASE) MCG/ACT inhaler Inhale 2 puffs into the lungs every 6 (six) hours as needed for wheezing or shortness of breath.    Yes [provider]  beclomethasone (QVAR) 80 MCG/ACT inhaler Inhale 1 puff into the lungs 2 (two) times daily.   Yes [provider]  cetirizine (ZYRTEC) 10 MG tablet Take 10 mg by mouth daily.   Yes [provider]  montelukast (SINGULAIR) 10 MG tablet Take 10 mg by mouth at bedtime.   Yes [provider]  omeprazole (PRILOSEC) 20 MG capsule Take 1 capsule (20 mg total) by mouth 2 (two) times daily before a meal. 01/09/14  Yes Toy Cookey, MD  Vitamin D, Ergocalciferol, (DRISDOL) 50000 units CAPS capsule Take 50,000 Units by mouth every 7 (seven) days.   Yes [provider]  fluconazole (DIFLUCAN) 150 MG tablet Take 1 tablet (150 mg total) by mouth daily. Take second dose 72 hours later if symptoms still persists. 07/01/17   Cathie Hoops, Amy V, PA-C  fluticasone (FLONASE) 50 MCG/ACT nasal spray Place 2 sprays into both nostrils daily. 07/01/17   Cathie Hoops, Amy V, PA-C  sulfamethoxazole-trimethoprim (BACTRIM DS,SEPTRA DS) 800-160 MG tablet Take 1 tablet by mouth 2 (two) times daily.  07/01/17 07/04/17  Belinda Fisher, PA-C    Family History History reviewed. No pertinent family history.  Social History Social History  Substance Use Topics  . Smoking status: Former Smoker    Types: Cigarettes  . Smokeless tobacco: Never Used  . Alcohol use No     Allergies   Codeine and Peanut-containing drug products   Review of Systems Review of Systems  Reason unable to perform ROS: See HPI as above.     Physical Exam Triage Vital Signs ED Triage Vitals  Enc Vitals Group     BP 07/01/17 1207 (!) 122/52     Pulse Rate 07/01/17 1207 72     Resp 07/01/17 1207 18     Temp 07/01/17 1207 98.5 F (36.9 C)     Temp Source 07/01/17 1207 Oral     SpO2 07/01/17 1207 100 %     Weight  --      Height --      Head Circumference --      Peak Flow --      Pain Score 07/01/17 1208 8     Pain Loc --      Pain Edu? --      Excl. in GC? --    No data found.   Updated Vital Signs BP (!) 122/52 (BP Location: Right Arm)   Pulse 72   Temp 98.5 F (36.9 C) (Oral)   Resp 18   LMP 06/09/2017 (Exact Date)   SpO2 100%   Visual Acuity Right Eye Distance:   Left Eye Distance:   Bilateral Distance:    Right Eye Near:   Left Eye Near:    Bilateral Near:     Physical Exam  Constitutional: She is oriented to person, place, and time. She appears well-developed and well-nourished. No distress.  HENT:  Head: Normocephalic and atraumatic.  Right Ear: Tympanic membrane, external ear and ear canal normal. Tympanic membrane is not erythematous and not bulging.  Left Ear: Tympanic membrane, external ear and ear canal normal. Tympanic membrane is not erythematous and not bulging.  Nose: Right sinus exhibits maxillary sinus tenderness and frontal sinus tenderness. Left sinus exhibits maxillary sinus tenderness and frontal sinus tenderness.  Mouth/Throat: Uvula is midline, oropharynx is clear and moist and mucous membranes are normal.  Eyes: Pupils are equal, round, and reactive to light. Conjunctivae are normal.  Neck: Normal range of motion. Neck supple.  Cardiovascular: Normal rate, regular rhythm and normal heart sounds.  Exam reveals no gallop and no friction rub.   No murmur heard. Pulmonary/Chest: Effort normal and breath sounds normal. She has no decreased breath sounds. She has no wheezes. She has no rhonchi. She has no rales.  Abdominal: Soft. Bowel sounds are normal. She exhibits no mass. There is no tenderness. There is no rebound and no guarding.  Genitourinary:  Genitourinary Comments: Deferred  Lymphadenopathy:    She has no cervical adenopathy.  Neurological: She is alert and oriented to person, place, and time.  Skin: Skin is warm and dry.  Psychiatric: She has a  normal mood and affect. Her behavior is normal. Judgment normal.     UC Treatments / Results  Labs (all labs ordered are listed, but only abnormal results are displayed) Labs Reviewed  POCT URINALYSIS DIP (DEVICE) - Abnormal; Notable for the following:       Result Value   Hgb urine dipstick TRACE (*)    Leukocytes, UA LARGE (*)    All other components  within normal limits  URINE CULTURE  POCT PREGNANCY, URINE  CERVICOVAGINAL ANCILLARY ONLY    EKG  EKG Interpretation None       Radiology No results found.  Procedures Procedures (including critical care time)  Medications Ordered in UC Medications - No data to display   Initial Impression / Assessment and Plan / UC Course  I have reviewed the triage vital signs and the nursing notes.  Pertinent labs & imaging results that were available during my care of the patient were reviewed by me and considered in my medical decision making (see chart for details).    Exam without alarming signs today. Dipstick positive for UTI, start Bactrim. Given vaginal discharge, will treat empirically for yeast infection. Cytology and urine culture sent, patient will be contacted with any positive results. Additional treatment needed will be sent in then. Refrain from sexual activity for the next 7 days. Given patient starting antibiotics, will treat sinusitis with nasal spray, patient to follow-up with ENT as suggested. Return precautions given.  Final Clinical Impressions(s) / UC Diagnoses   Final diagnoses:  Cystitis  Chronic pansinusitis    New Prescriptions New Prescriptions   FLUCONAZOLE (DIFLUCAN) 150 MG TABLET    Take 1 tablet (150 mg total) by mouth daily. Take second dose 72 hours later if symptoms still persists.   FLUTICASONE (FLONASE) 50 MCG/ACT NASAL SPRAY    Place 2 sprays into both nostrils daily.   SULFAMETHOXAZOLE-TRIMETHOPRIM (BACTRIM DS,SEPTRA DS) 800-160 MG TABLET    Take 1 tablet by mouth 2 (two) times daily.       Belinda FisherYu, Amy V, PA-C 07/01/17 1249

## 2017-07-01 NOTE — ED Triage Notes (Signed)
[facility] a [facility] pain with a vaginal discharge and left ear pain.

## 2017-07-03 LAB — URINE CULTURE: Culture: >=100000 — AB

## 2017-07-03 LAB — CERVICOVAGINAL ANCILLARY ONLY
Bacterial vaginitis: NEGATIVE
CANDIDA VAGINITIS: POSITIVE — AB
Chlamydia: NEGATIVE
Neisseria Gonorrhea: NEGATIVE
Trichomonas: NEGATIVE

## 2017-07-07 ENCOUNTER — Telehealth (HOSPITAL_COMMUNITY): Payer: Self-pay | Admitting: Emergency Medicine

## 2017-07-07 MED ORDER — PENICILLIN V POTASSIUM 500 MG PO TABS: 500.0000 mg | ORAL_TABLET | Freq: Two times a day (BID) | ORAL | 0 refills | Status: AC

## 2017-07-07 NOTE — Telephone Encounter (Signed)
Called pt to notify of recent lab results.... Pt ID'd properly Reports feeling better but still having sx Pt is taking/tolarating well meds given at visit.  Would like for [place] to call in PCN into [facility] ([place])... Allergies updated.  Adv pt if sx are not getting better to return or to f/u w/PCP Education on safe sex given Notified pt that lab results can be obtained through MyChart Pt verb understanding.

## 2017-07-07 NOTE — Telephone Encounter (Signed)
-----   Message from Eustace Moore, MD sent at 07/03/2017  3:45 PM EDT ----- Clinical staff, please let patient know that urine culture was positive for Strep germ, which is not always sensitive to trimethoprim/sulfa rx given at the urgent care visit 8/14.   If urinary discomfort has subsided, no further treatment is needed. If having persistent urinary discomfort, would send rx for penicillin V 500mg  bid x 5d #10 no refills.   Test for candida (yeast) was positive.  Rx fluconazole was given at the urgent care visit.  Recheck for further evaluation if symptoms are not improving.  LM

## 2017-10-24 DIAGNOSIS — M79672 Pain in left foot: Secondary | ICD-10-CM | POA: Diagnosis not present

## 2017-10-24 DIAGNOSIS — M19172 Post-traumatic osteoarthritis, left ankle and foot: Secondary | ICD-10-CM | POA: Diagnosis not present

## 2018-01-04 ENCOUNTER — Encounter (HOSPITAL_COMMUNITY): Payer: Self-pay | Admitting: Emergency Medicine

## 2018-01-04 ENCOUNTER — Ambulatory Visit (HOSPITAL_COMMUNITY)
Admission: EM | Admit: 2018-01-04 | Discharge: 2018-01-04 | Disposition: A | Discharge status: Home / Self Care | Payer: Medicaid Other | Attending: Internal Medicine | Admitting: Internal Medicine

## 2018-01-04 ENCOUNTER — Other Ambulatory Visit: Payer: Self-pay

## 2018-01-04 DIAGNOSIS — J019 Acute sinusitis, unspecified: Secondary | ICD-10-CM | POA: Diagnosis not present

## 2018-01-04 MED ORDER — AMOXICILLIN-POT CLAVULANATE 875-125 MG PO TABS: 1.0000 | ORAL_TABLET | Freq: Two times a day (BID) | ORAL | 0 refills | Status: DC

## 2018-01-04 MED ORDER — PREDNISONE 50 MG PO TABS: 50.0000 mg | ORAL_TABLET | Freq: Every day | ORAL | 0 refills | Status: DC

## 2018-01-04 NOTE — Discharge Instructions (Addendum)
Anticipate gradual improvement over the next several days in head congestion, runny nose, well being.  Cough may take a couple weeks to subside.  Recheck for new fever >100.5, increasing phlegm production/nasal discharge, or if not starting to improve in a few days.   Prescriptions for prednisone (for congestion, cough) and for amoxicillin/clavulanate (antibiotic) were sent to the pharmacy.  Push fluids and rest.  Note for work x 2 days, return to work on 01/06/18.

## 2018-01-04 NOTE — ED Provider Notes (Signed)
[facility]    CSN: [id number] Arrival date & time: 01/04/18  1201     History   Chief Complaint Chief Complaint  Patient presents with  . URI    HPI Melissa Guerra is a 37 y.o. female.   She presents today with the onset of runny/drippy nose, severe head congestion, 3 weeks ago.  She has been managing this with OTC products, and had worsening of symptoms starting 2-3 days ago.  She has had malaise, headache, achiness of arms and legs.  Cough, some sore throat.  Bilateral ears are uncomfortable, left greater than right.  Chills, no fever.  Several sick coworkers.  Also takes care of a 49-month-old grandbaby.    HPI  Past Medical History:  Diagnosis Date  . Abnormal Pap smear   . Acid reflux   . Asthma   . Obesity     Patient Active Problem List   Diagnosis Date Noted  . 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 FOOT SURGERY    . TONSILLECTOMY    . TONSILLECTOMY AND ADENOIDECTOMY    . TONSILLECTOMY AND ADENOIDECTOMY    . WISDOM TOOTH EXTRACTION      OB History    Gravida Para Term Preterm AB Living   2 2           SAB TAB Ectopic Multiple Live Births                   Home Medications    Prior to Admission medications   Medication Sig Start Date End Date Taking? Authorizing Provider  albuterol (ACCUNEB) 0.63 MG/3ML nebulizer solution Take 1 ampule by nebulization every 6 (six) hours as needed for wheezing or shortness of breath.    [provider]  albuterol (PROVENTIL HFA;VENTOLIN HFA) 108 (90 BASE) MCG/ACT inhaler Inhale 2 puffs into the lungs every 6 (six) hours as needed for wheezing or shortness of breath.     [provider]  amoxicillin-clavulanate (AUGMENTIN) 875-125 MG tablet Take 1 tablet by mouth every 12 (twelve) hours. 01/04/18   Isa Rankin, MD  beclomethasone (QVAR) 80 MCG/ACT inhaler Inhale 1 puff into the lungs 2 (two) times daily.     [provider]  cetirizine (ZYRTEC) 10 MG tablet Take 10 mg by mouth daily.    [provider]  fluconazole (DIFLUCAN) 150 MG tablet Take 1 tablet (150 mg total) by mouth daily. Take second dose 72 hours later if symptoms still persists. 07/01/17   Cathie Hoops, Amy V, PA-C  fluticasone (FLONASE) 50 MCG/ACT nasal spray Place 2 sprays into both nostrils daily. 07/01/17   Cathie Hoops, Amy V, PA-C  montelukast (SINGULAIR) 10 MG tablet Take 10 mg by mouth at bedtime.    [provider]  omeprazole (PRILOSEC) 20 MG capsule Take 1 capsule (20 mg total) by mouth 2 (two) times daily before a meal. 01/09/14   Toy Cookey, MD  predniSONE (DELTASONE) 50 MG tablet Take 1 tablet (50 mg total) by mouth daily. 01/04/18   Isa Rankin, MD  Vitamin D, Ergocalciferol, (DRISDOL) 50000 units CAPS capsule Take 50,000 Units by mouth every 7 (seven) days.    [provider]  ranitidine (ZANTAC) 150 MG capsule Take 1 capsule (150 mg total) by mouth daily. 01/09/14 01/09/14  Toy Cookey, MD    Family History No family history on file.  Social History Social History   Tobacco Use  .  Smoking status: Former Smoker    Types: Cigarettes  . Smokeless tobacco: Never Used  Substance Use Topics  . Alcohol use: No  . Drug use: No     Allergies   Codeine and Peanut-containing drug products   Review of Systems Review of Systems  All other systems reviewed and are negative.    Physical Exam Triage Vital Signs ED Triage Vitals  Enc Vitals Group     BP 01/04/18 1221 (!) 144/84     Pulse Rate 01/04/18 1221 82     Resp 01/04/18 1221 18     Temp 01/04/18 1221 98.3 F (36.8 C)     Temp src --      SpO2 01/04/18 1221 100 %     Weight --      Height --      Pain Score 01/04/18 1222 8     Pain Loc --    Updated Vital Signs BP (!) 144/84   Pulse 82   Temp 98.3 F (36.8 C)   Resp 18   SpO2 100%   Physical Exam  Constitutional: She is oriented to person, place, and time. No  distress.  Alert, nicely groomed   HENT:  Head: Atraumatic.  B TMs flushed pink, dull, not bulging Mod nasal congestion bilat Throat slightly injected  Eyes:  Conjugate gaze, no eye redness/drainage  Neck: Neck supple.  Cardiovascular: Normal rate.  Pulmonary/Chest: No respiratory distress.  Coarse but symmetric breath sounds throughout  Abdominal: She exhibits no distension.  Musculoskeletal: Normal range of motion.  No leg swelling  Neurological: She is alert and oriented to person, place, and time.  Skin: Skin is warm and dry.  No cyanosis  Nursing note and vitals reviewed.    UC Treatments / Results   Procedures Procedures (including critical care time) None today  Final Clinical Impressions(s) / UC Diagnoses   Final diagnoses:  Acute sinusitis with symptoms > 10 days   Anticipate gradual improvement over the next several days in head congestion, runny nose, well being.  Cough may take a couple weeks to subside.  Recheck for new fever >100.5, increasing phlegm production/nasal discharge, or if not starting to improve in a few days.   Prescriptions for prednisone (for congestion, cough) and for amoxicillin/clavulanate (antibiotic) were sent to the pharmacy.  Push fluids and rest.  Note for work x 2 days, return to work on 01/06/18.    ED Discharge Orders        Ordered    amoxicillin-clavulanate (AUGMENTIN) 875-125 MG tablet  Every 12 hours     01/04/18 1233    predniSONE (DELTASONE) 50 MG tablet  Daily     01/04/18 1233          Isa RankinMurray, Nixxon Faria Wilson, MD 01/07/18 1226

## 2018-01-04 NOTE — ED Triage Notes (Signed)
Pt c/o fatigue, nasal congestion, body aches x3 weeks.

## 2018-01-09 ENCOUNTER — Ambulatory Visit: Payer: Medicaid Other | Admitting: Physical Therapy

## 2018-01-09 NOTE — Progress Notes (Signed)
This encounter was created in error - please disregard.  This encounter was created in error - please disregard.

## 2018-01-20 ENCOUNTER — Ambulatory Visit: Payer: Medicaid Other | Attending: Orthopedic Surgery | Admitting: Physical Therapy

## 2018-01-20 ENCOUNTER — Encounter: Payer: Self-pay | Admitting: Physical Therapy

## 2018-01-20 DIAGNOSIS — M6281 Muscle weakness (generalized): Secondary | ICD-10-CM

## 2018-01-20 DIAGNOSIS — M25572 Pain in left ankle and joints of left foot: Secondary | ICD-10-CM

## 2018-01-20 DIAGNOSIS — M25675 Stiffness of left foot, not elsewhere classified: Secondary | ICD-10-CM

## 2018-01-20 DIAGNOSIS — R262 Difficulty in walking, not elsewhere classified: Secondary | ICD-10-CM | POA: Diagnosis present

## 2018-01-20 NOTE — Patient Instructions (Signed)
Gastroc / Heel Cord Stretch - Seated With Towel   Sit on floor, towel around ball of foot. Gently pull foot in toward body, stretching heel cord and calf. Hold for 30___ seconds. Repeat on involved leg. Repeat _2__ times. Do _2__ times per day.   Forefoot Evertors   Place right foot flat on towel, knee pointed forward. Use forefoot and toes to push towel out to side. Do not allow heel or knee to move. Hold __3__ seconds. Repeat __5__ times. Do _2__ sessions per day. CAUTION: Repetitions should be slow and controlled.  Forefoot Invertors   Place right foot flat on towel, knee pointed forward. Use forefoot and toes to pull towel in toward center. Do not allow heel or knee to move. Hold __3__ seconds. Repeat __5__ times. Do __2__ sessions per day. CAUTION: Repetitions should be slow and controlled.   TOES: Towel Bunching   With involved straight toes on towel, bend toes bunching up towel _5__ times. Do _2__ times per day.  Copyright  VHI. All rights reserved.   Melissa Guerra, PT Certified Exercise Expert for the Aging Adult  01/20/18 9:24 AM Phone: [phone] Fax: [phone]
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
## 2018-01-27 ENCOUNTER — Encounter: Payer: Self-pay | Admitting: Physical Therapy

## 2018-01-27 ENCOUNTER — Ambulatory Visit: Payer: Medicaid Other | Admitting: Physical Therapy

## 2018-01-27 DIAGNOSIS — R262 Difficulty in walking, not elsewhere classified: Secondary | ICD-10-CM

## 2018-01-27 DIAGNOSIS — M25572 Pain in left ankle and joints of left foot: Secondary | ICD-10-CM | POA: Diagnosis not present

## 2018-01-27 DIAGNOSIS — M6281 Muscle weakness (generalized): Secondary | ICD-10-CM

## 2018-01-27 DIAGNOSIS — M25675 Stiffness of left foot, not elsewhere classified: Secondary | ICD-10-CM

## 2018-01-27 NOTE — Therapy (Signed)
[facility] [address] [place], [place], [address] Phone: [phone]   Fax:  [phone]  Physical Therapy Treatment  Patient Details  Name: Melissa Guerra MRN: [id number] Date of Birth: 1981-01-26 Referring Provider: Konrad SahaHewit, John MD   Encounter Date: 01/27/2018  PT End of Session - 01/27/18 0821    Visit Number  2    Number of Visits  12    Date for PT Re-Evaluation  03/03/18    Authorization Type  Medicaid    PT Start Time  0816    PT Stop Time  0845    PT Time Calculation (min)  29 min    Activity Tolerance  Patient tolerated treatment well    Behavior During Therapy  [facility] for tasks assessed/performed       Past Medical History:  Diagnosis Date  . Abnormal Pap smear   . Acid reflux   . Asthma   . Obesity     Past Surgical History:  Procedure Laterality Date  . FOOT SURGERY    . TONSILLECTOMY    . TONSILLECTOMY AND ADENOIDECTOMY    . TONSILLECTOMY AND ADENOIDECTOMY    . WISDOM TOOTH EXTRACTION      There were no vitals filed for this visit.  Subjective Assessment - 01/27/18 0820    Subjective  I have been trying to do exercise.      Patient Stated Goals  Develop  strength. be less afraid of using my ankle.  I havent fallen and I dont want to be afraid of falling or hurting it.     Currently in Pain?  Yes    Pain Score  7     Pain Location  Ankle    Pain Orientation  Left    Pain Descriptors / Indicators  Aching    Pain Type  Chronic pain    Pain Onset  More than a month ago    Pain Frequency  Constant                      [facility] Adult PT Treatment/Exercise - 01/27/18 0843      Manual Therapy   Manual Therapy  Joint mobilization    Joint Mobilization  talocrural on left and laterally grade 3/4      Ankle Exercises: Standing   SLS   5 x 15 sec with bil UE support on left ankle    Other Standing Ankle Exercises  step ups with bil UE support x 20 left ankle      Ankle Exercises:  Aerobic   Stationary Bike  -- Nustep 6 min level 2 very slow movement      Ankle Exercises: Seated   Towel Inversion/Eversion  -- 10 reps x 2    Towel Inversion/Eversion Limitations  Pt left/right simultanieously     Heel Raises  10 reps x2 with UE support bil LE    Heel Raises Limitations  pt slow,    Toe Raise  10 reps x 2 bil    Toe Raise Limitations  pain             PT Education - 01/27/18 0837    Education provided  Yes    Education Details  added standing heel raise, toe raise, t band with ankle eversion, inversion    Person(s) Educated  Patient    Methods  Explanation;Demonstration;Tactile cues;Verbal cues;Handout    Comprehension  Verbalized understanding;Returned demonstration       PT  Short Term Goals - 01/27/18 0851      PT SHORT TERM GOAL #1   Title  STG=LTG        PT Long Term Goals - 01/20/18 1218      PT LONG TERM GOAL #1   Title  "Pt will be independent with advanced HEP.     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    Baseline  Pt presently at 8/10 pain and unable to stand for work duties greater than 5 minutes without pain    Time  6    Period  Weeks    Status  New    Target Date  03/03/18      PT LONG TERM GOAL #3   Title  Pt will be able to negotiate steps with step over step techinique and minimal dependence on UE support    Time  6    Period  Weeks    Status  New    Target Date  03/03/18      PT LONG TERM GOAL #4   Title  Pt will be able to demonstrate walking on treadmill for 1 mile to transition to home exericse with walking pain level 3/10 or less     Baseline  Unable to walk greater than 5 minutes without 8/10 pain    Time  6    Period  Weeks    Status  New    Target Date  03/03/18       PT LONG TERM GOAL #5   Title  Pt will be able to stand for 30 minutes in order to perform household chores     Baseline  Pt unable to stand for greater than 5 minutes without exacerbating pain in left foot/ankle    Time  6    Period  Weeks    Status  New    Target Date  03/03/18      PT LONG TERM GOAL #6   Title  Pt will be able to perform single heel raise on left LE x 10 in order to improve gait velocity for community ambulation to at last 2.62 ft/sec.     Baseline  Presently 2.08 ft/sec and unable to perform single limb raise on left    Time  6    Period  Weeks    Status  New            Plan - 01/27/18 1610    Clinical Impression Statement  Pt presents with 7/10 pain in ankle and antalgic gait.  Pt arrived 16 minutes late to treatment and was reviewed and advanced HEP for ankle strength.  Pt able to do all exercises. Pt moves at an ultra slow pace for exercises and gait.     Rehab Potential  Good    PT Frequency  2x / week    PT Duration  6 weeks    PT Treatment/Interventions  ADLs/Self Care Home Management;Cryotherapy;Electrical Stimulation;Iontophoresis 4mg /ml Dexamethasone;Moist Heat;Ultrasound;Gait training;Functional mobility training;Therapeutic activities;Therapeutic exercise;Neuromuscular re-education;Patient/family education;Manual techniques;Passive range of motion;Dry needling;Taping    PT Next Visit Plan  mobilization of ankle, modalities  as needed. reinforce exercises,  maybe step ups as tolerated    PT Home Exercise Plan  towel DF stretch and towel scrunches evertors, invertors and toe flexion sls, heel raise and toe raise ,     Consulted and Agree with Plan of Care  Patient       Patient will benefit from skilled therapeutic intervention in order to improve the following deficits and impairments:  Abnormal gait, Decreased mobility, Decreased range of motion, Difficulty walking, Decreased strength, Hypomobility, Increased fascial restricitons, Obesity, Postural  dysfunction, Improper body mechanics, Pain  Visit Diagnosis: Pain in left ankle and joints of left foot  Difficulty in walking, not elsewhere classified  Muscle weakness (generalized)  Stiffness of left foot, not elsewhere classified     Problem List Patient Active Problem List   Diagnosis Date Noted  . CIN II (cervical intraepithelial neoplasia II) 01/02/2012    Garen Lah, PT Certified Exercise Expert for the Aging Adult  01/27/18 8:52 AM Phone: [phone] Fax: [phone]  [facility] [address] [place], [place], [address] Phone: [phone]   Fax:  [phone]  Name: Melissa Guerra MRN: [id number] Date of Birth: 08/01/1981

## 2018-01-27 NOTE — Patient Instructions (Addendum)
SINGLE LIMB STANCE   Stance: single leg on floor. Raise leg. Hold _15 - 30__ seconds. Repeat with other leg. _5__ reps per set, __1_ sets per day, _7__ days per week May use upper extremity support as needed   http://orth.exer.us/20   Copyright  VHI. All rights reserved.  Ankle Plantar Flexion / Dorsiflexion, Standing   Stand while holding a stable object. Rise up on toes. Then rock back on heels. Hold each position __3_ seconds. Repeat __15 x 2_ times per session. Do _3__ sessions per day.  ANKLE: Eversion, Bilateral (Band)   Place band around feet. Keeping heels on floor, raise toes of both feet up and away from body. Do not move hips. Hold _3__ seconds. Use __red______ band. _20__ reps per set, _2_ sets per day, __7_ days per week  Copyright  VHI. All rights reserved.    Inversion: Resisted   Cross legs with right leg underneath, foot in tubing loop. Hold tubing around other foot to resist and turn foot in.  Repeat _20___ times per set. Do _2___ sets per session. Do _1-2___ sessions per day.      Garen LahLawrie Twana Wileman, PT Certified Exercise Expert for the Aging Adult  01/27/18 8:24 AM Phone: [phone] Fax: [phone]

## 2018-02-03 ENCOUNTER — Encounter: Payer: Self-pay | Admitting: Physical Therapy

## 2018-02-03 ENCOUNTER — Ambulatory Visit: Payer: Medicaid Other | Admitting: Physical Therapy

## 2018-02-03 DIAGNOSIS — M25572 Pain in left ankle and joints of left foot: Secondary | ICD-10-CM | POA: Diagnosis not present

## 2018-02-03 DIAGNOSIS — R262 Difficulty in walking, not elsewhere classified: Secondary | ICD-10-CM

## 2018-02-03 DIAGNOSIS — M25675 Stiffness of left foot, not elsewhere classified: Secondary | ICD-10-CM

## 2018-02-03 DIAGNOSIS — M6281 Muscle weakness (generalized): Secondary | ICD-10-CM

## 2018-02-03 NOTE — Patient Instructions (Addendum)
Step Down: Anterior   From 4-6" step, reach one leg forward as far as possible, still able to return easily. Touch toe and heel to floor. Return to single leg balance. Repeat with other leg. Repeat _10___ times. Do _2___ sessions per day.   Copyright  VHI. All rights reserved.  Step-Up: Lateral   Step up to side with right leg. Bring other foot up onto _6___ inch step. Return to floor position with left leg. Repeat _10___ times per session. Do _2___ sessions per day. Repeat in dimly lit room. Repeat with eyes closed.  Copyright  VHI. All rights reserved.  Forward   Facing step, place one leg on step, flexed at hip. Step up slowly, bringing hips in line with knee and shoulder. Bring other foot onto step. Reverse process to step back down. Repeat with other leg. Do _10___ repetitions, _2___ sets.  http://bt.exer.us/154   Copyright  VHI. All rights reserved.     http://orth.exer.us/734   Copyright  VHI. All rights reserved.  Strengthening: Wall Slide   Leaning on wall, slowly lower buttocks until thighs are parallel to floor. Hold _10___ seconds. Tighten thigh muscles and return. Repeat __10__ times per set. For 10 sec.  Do __1__ sets per session. Do __2__ sessions per day. Gastroc / Heel Cord Stretch - Seated With Towel   Gastrocnemius    Support with hands on wall, back straight. Place right foot back. Keep heels flat with knee straight. Do not raise up on toes. Do not sway or round back. Hold __30__ seconds. Repeat _3___ times. Do _2___ sessions per day. CAUTION: Stretch should be gentle, steady and slow.  Copyright  VHI. All rights reserved.  Calf Stretch (Soleus)    Gently bend knees slightly and move one foot back. Lean into wall so that stretch is felt in back lower calf. Hold _30___ seconds. Repeat with other leg back. Repeat _2-3___ times. Do _2___ sessions per day.  http://gt2.exer.us/418   Copyright  VHI. All rights reserved.       Garen LahLawrie  Beardsley, PT Certified Exercise Expert for the Aging Adult  02/03/18 9:13 AM Phone: [phone] Fax: [phone]

## 2018-02-03 NOTE — Therapy (Signed)
[facility] [address] [place], [place], [address] Phone: [phone]   Fax:  [phone]  Physical Therapy Treatment  Patient Details  Name: Melissa Guerra MRN: [id number] Date of Birth: 08-24-1981 Referring Provider: Konrad SahaHewit, John MD   Encounter Date: 02/03/2018  PT End of Session - 02/03/18 0853    Visit Number  3    Number of Visits  12    Date for PT Re-Evaluation  03/03/18    Authorization Type  Medicaid    PT Start Time  0848    PT Stop Time  0930    PT Time Calculation (min)  42 min    Activity Tolerance  Patient tolerated treatment well    Behavior During Therapy  [facility] for tasks assessed/performed       Past Medical History:  Diagnosis Date  . Abnormal Pap smear   . Acid reflux   . Asthma   . Obesity     Past Surgical History:  Procedure Laterality Date  . FOOT SURGERY    . TONSILLECTOMY    . TONSILLECTOMY AND ADENOIDECTOMY    . TONSILLECTOMY AND ADENOIDECTOMY    . WISDOM TOOTH EXTRACTION      There were no vitals filed for this visit.  Subjective Assessment - 02/03/18 0855    Subjective  I slept a lot this weekend.  I did not have to work at [employer] this weekend    Limitations  Standing;Walking;Sitting;House hold activities    How long can you sit comfortably?  can sit 30 to 45 minutes    How long can you stand comfortably?  10 minutes washing dishes    How long can you walk comfortably?  10 minutes    Diagnostic tests  x ray    Patient Stated Goals  Develop  strength. be less afraid of using my ankle.  I havent fallen and I dont want to be afraid of falling or hurting it.     Currently in Pain?  Yes    Pain Score  6     Pain Location  Ankle    Pain Orientation  Left    Pain Descriptors / Indicators  Aching    Pain Type  Chronic pain    Pain Onset  More than a month ago    Pain Frequency  Constant                      [facility] Adult PT Treatment/Exercise - 02/03/18 0857      Knee/Hip Exercises: Standing   Lateral Step Up  Step Height: 6";Hand Hold: 1;10 reps x 2    Forward Step Up  Hand Hold: 2;10 reps;Step Height: 6" x 2    Step Down  Hand Hold: 2;10 reps;Step Height: 6" x 2    Wall Squat  20 reps    Gait Training  attempted toe walking and heel walking  not ready yet     Other Standing Knee Exercises  Gastroc stretch stretch on slant board 30 sec x 2 face wall and SLS L and move R LE abd/add past midlinex 20      Modalities   Modalities  Moist Heat      Moist Heat Therapy   Number Minutes Moist Heat  10 Minutes    Moist Heat Location  Ankle left      Ankle Exercises: Aerobic   Nustep  5 minutes level 3 with UE as well  Ankle Exercises: Seated   Heel Raises  10 reps x2 with UE support bil LE    Toe Raise  10 reps x 2 bil      Ankle Exercises: Stretches   Soleus Stretch  2 reps;30 seconds left only    Gastroc Stretch  3 reps;30 seconds left only      Ankle Exercises: Standing   SLS   5 x 15 sec with bil UE support on left ankle      pt requires extra time for completion of exercises       PT Education - 02/03/18 0917    Education provided  Yes    Education Details  added knee step ups forward, lateral and step downs and wall slide    Person(s) Educated  Patient    Methods  Explanation;Demonstration;Tactile cues;Verbal cues;Handout    Comprehension  Verbalized understanding;Returned demonstration       PT Short Term Goals - 01/27/18 0851      PT SHORT TERM GOAL #1   Title  STG=LTG        PT Long Term Goals - 01/20/18 1218      PT LONG TERM GOAL #1   Title  "Pt will be independent with advanced HEP.     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    Baseline  Pt presently at 8/10 pain  and unable to stand for work duties greater than 5 minutes without pain    Time  6    Period  Weeks    Status  New    Target Date  03/03/18      PT LONG TERM GOAL #3   Title  Pt will be able to negotiate steps with step over step techinique and minimal dependence on UE support    Time  6    Period  Weeks    Status  New    Target Date  03/03/18      PT LONG TERM GOAL #4   Title  Pt will be able to demonstrate walking on treadmill for 1 mile to transition to home exericse with walking pain level 3/10 or less     Baseline  Unable to walk greater than 5 minutes without 8/10 pain    Time  6    Period  Weeks    Status  New    Target Date  03/03/18      PT LONG TERM GOAL #5   Title  Pt will be able to stand for 30 minutes in order to perform household chores     Baseline  Pt unable to stand for greater than 5 minutes without exacerbating pain in left foot/ankle    Time  6    Period  Weeks    Status  New    Target Date  03/03/18      PT LONG TERM GOAL #6   Title  Pt will be able to perform single heel raise on left LE x 10 in order to improve gait velocity for community ambulation to at last 2.62 ft/sec.     Baseline  Presently  2.08 ft/sec and unable to perform single limb raise on left    Time  6    Period  Weeks    Status  New            Plan - 02/03/18 0909    Clinical Impression Statement  Pt presents with 6/10 pain and continues to exercises well during session. Pt works at a slower pace than average.  Pt will be recertified next visit and goals assessed for progress.  Pt progessing to step ups and ending with Moist heat for calf muscles.    Rehab Potential  Good    PT Frequency  2x / week    PT Duration  6 weeks    PT Treatment/Interventions  ADLs/Self Care Home Management;Cryotherapy;Electrical Stimulation;Iontophoresis 4mg /ml Dexamethasone;Moist Heat;Ultrasound;Gait training;Functional mobility training;Therapeutic activities;Therapeutic exercise;Neuromuscular  re-education;Patient/family education;Manual techniques;Passive range of motion;Dry needling;Taping    PT Next Visit Plan  Recertify 4th visit for Medicaid, assess goals.  continue progressing exercises and gait    PT Home Exercise Plan  towel DF stretch and towel scrunches evertors, invertors and toe flexion sls, heel raise and toe raise , wall sllides    Consulted and Agree with Plan of Care  Patient       Patient will benefit from skilled therapeutic intervention in order to improve the following deficits and impairments:  Abnormal gait, Decreased mobility, Decreased range of motion, Difficulty walking, Decreased strength, Hypomobility, Increased fascial restricitons, Obesity, Postural dysfunction, Improper body mechanics, Pain  Visit Diagnosis: Pain in left ankle and joints of left foot  Difficulty in walking, not elsewhere classified  Muscle weakness (generalized)  Stiffness of left foot, not elsewhere classified     Problem List Patient Active Problem List   Diagnosis Date Noted  . CIN II (cervical intraepithelial neoplasia II) 01/02/2012    Garen Lah, PT Certified Exercise Expert for the Aging Adult  02/03/18 9:34 AM Phone: [phone] Fax: [phone]  [facility] [address] [place], [place], [address] Phone: [phone]   Fax:  [phone]  Name: Melissa Guerra MRN: [id number] Date of Birth: Jul 04, 1981

## 2018-02-05 ENCOUNTER — Encounter: Payer: Medicaid Other | Admitting: Physical Therapy

## 2018-02-10 ENCOUNTER — Telehealth: Payer: Self-pay | Admitting: Physical Therapy

## 2018-02-10 ENCOUNTER — Ambulatory Visit: Payer: Medicaid Other | Admitting: Physical Therapy

## 2018-02-10 NOTE — Telephone Encounter (Signed)
Pt did not show for this mornings appt at 9:30. Pt called by PT and reminded or cancellation and attendance policy. Pt told to come for 8:00 AM appt aon 02-12-18 or tocall at [phone] to cancel or confirm. If pt desires to not continue PT please let our front office know to cancel appt  Garen LahLawrie Sherae Santino, PT Certified Exercise Expert for the Aging Adult  02/10/18 10:17 AM Phone: [phone] Fax: [phone]

## 2018-02-12 ENCOUNTER — Ambulatory Visit: Payer: Medicaid Other | Admitting: Physical Therapy

## 2018-02-12 ENCOUNTER — Encounter: Payer: Self-pay | Admitting: Physical Therapy

## 2018-02-12 DIAGNOSIS — R262 Difficulty in walking, not elsewhere classified: Secondary | ICD-10-CM

## 2018-02-12 DIAGNOSIS — M25675 Stiffness of left foot, not elsewhere classified: Secondary | ICD-10-CM

## 2018-02-12 DIAGNOSIS — M25572 Pain in left ankle and joints of left foot: Secondary | ICD-10-CM

## 2018-02-12 DIAGNOSIS — M6281 Muscle weakness (generalized): Secondary | ICD-10-CM

## 2018-02-12 NOTE — Patient Instructions (Addendum)
        Garen LahLawrie Fryda Molenda, PT Certified Exercise Expert for the Aging Adult  02/12/18 8:24 AM Phone: [phone] Fax: [phone]

## 2018-02-12 NOTE — Therapy (Addendum)
[place], [place], [address] Phone: [phone]   Fax:  [phone]  Physical Therapy Treatment/recertification/ re authorization/Discharge Note  Patient Details  Name: Melissa Guerra MRN: [id number] Date of Birth: Mar 18, 1981 Referring Provider: Wylene Simmer MD   Encounter Date: 02/12/2018  PT End of Session - 02/12/18 0802    Visit Number  4    Number of Visits  12    Date for PT Re-Evaluation  03/26/18 03/03/18 original    Authorization Type  Medicaid reauthorization 2/83/15 and recertification     PT Start Time  0800    PT Stop Time  0844    PT Time Calculation (min)  44 min    Activity Tolerance  Patient tolerated treatment well    Behavior During Therapy  [facility] for tasks assessed/performed       Past Medical History:  Diagnosis Date  . Abnormal Pap smear   . Acid reflux   . Asthma   . Obesity     Past Surgical History:  Procedure Laterality Date  . FOOT SURGERY    . TONSILLECTOMY    . TONSILLECTOMY AND ADENOIDECTOMY    . TONSILLECTOMY AND ADENOIDECTOMY    . WISDOM TOOTH EXTRACTION      There were no vitals filed for this visit.  Subjective Assessment - 02/12/18 0807    Subjective  I am using my ankle a lot more.  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     Limitations  Standing;Walking;Sitting;House hold activities    How long can you sit comfortably?  can sit to watch 1 hour TV show    How long can you stand comfortably?  1 hour    How long can you walk comfortably?  1 hour    Patient Stated Goals  Develop  strength. be less afraid of using my ankle.  I havent fallen and I dont want to be afraid of falling or hurting it.     Currently in Pain?  Yes    Pain Score  5     Pain Location  Ankle    Pain Orientation  Left    Pain Descriptors / Indicators  Aching    Pain Type  Chronic pain    Pain Onset  More than a month ago    Pain Frequency  Intermittent          [facility] PT Assessment - 02/12/18 0830      Assessment   Referring Provider  Wylene Simmer MD      AROM   Left Knee Extension  0    Left Knee Flexion  115    Left Ankle Dorsiflexion  8    Left Ankle Plantar Flexion  52    Left Ankle Inversion  23    Left Ankle Eversion  13      Strength   Left Hip ABduction  4-/5    Right Ankle Dorsiflexion  4/5    Right Ankle Plantar Flexion  4-/5    Left Ankle Dorsiflexion  4-/5    Left Ankle Plantar Flexion  4-/5            No data recorded       [facility] Adult PT Treatment/Exercise - 02/12/18 1761      Knee/Hip Exercises: Standing   Lateral Step Up  Step Height: 6";Hand Hold: 1;10 reps x 2    Forward Step Up  Hand Hold: 2;10 reps;Step Height:  6" x 2    Wall Squat  10 reps 10 sec hold    SLS  single limb heel raise x 15 on left, bil x 20 holding onto counter,      Knee/Hip Exercises: Sidelying   Clams  10 x 2 right sidelying  red t band      Moist Heat Therapy   Number Minutes Moist Heat  10 Minutes    Moist Heat Location  Ankle left      Manual Therapy   Manual Therapy  Joint mobilization    Joint Mobilization  talocrural on left and laterally grade 3/4 with moist heat       Ankle Exercises: Aerobic   Elliptical  5 minutes level 1             PT Education - 02/12/18 0824    Education provided  Yes    Education Details  added to HEP hip/knee/ankle strength    Person(s) Educated  Patient    Methods  Explanation;Demonstration;Tactile cues;Verbal cues;Handout    Comprehension  Verbalized understanding;Returned demonstration       PT Short Term Goals - 02/12/18 0836      PT SHORT TERM GOAL #1   Title  STG=LTG        PT Long Term Goals - 02/12/18 0836      PT LONG TERM GOAL #1   Title  "Pt will be independent with advanced HEP.     Baseline  Pt is now independent with intial exercise.  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, water quality at [employer]    Time  6    Period  Weeks    Status  On-going    Target Date  03/26/18      PT LONG TERM GOAL #3   Title  Pt will be able to negotiate steps with step over step techinique and minimal dependence on UE support    Baseline  Pt less dependent for UE support but still must negotiate steps one step at a time, left ankle strength is improved from 3-/5 to 4-/5 AROM has increased  to DF, 8, PF 52, IN 23 and EV 13 for increased AROM    Time  6    Period  Weeks    Status  On-going    Target Date  03/26/18      PT LONG TERM GOAL #4   Title  Pt will be able to demonstrate walking on treadmill for 1 mile to transition to home exericse with walking pain level 3/10 or less     Baseline  Pt able to perform on the elliptical for 5 minutes only  5/10 pain     Time  6    Period  Weeks    Status  On-going      PT LONG TERM GOAL #5   Title  Pt will be able to stand for 30 minutes in order to perform household chores     Baseline  Pt able  to stand for one hour as Scientist, water quality at work     Time  6    Period  Weeks    Status  Partially Met    Target Date  03/26/18      PT LONG TERM GOAL #6   Title  Pt will be able to perform single heel raise on left LE x 10 in order to improve gait velocity for community ambulation to at last 2.62 ft/sec.     Baseline  Pt able to perform single heel raise x 10 for exericise now with decrease in wt bearing of Great toe,     Time  6    Period  Weeks    Status  Partially Met            Plan - 02/12/18 1446    Clinical Impression Statement  Pt is making progress toward LTG. with goals # 5 and # 6 partially met.  Pt is now able to stand for 1 hour without a break to perform work duties as a Scientist, water quality.  Pt strength in left ankle is improved from 3-/5 to 4-/5 and AROM has increased  to DF, 8, PF 52, IN 23 and EV 13 for increased AROM on Steps.  Pt will benefit from extending time to complete visits and to maximize function and strength to return to walking for exercises and completing full work day successfully.    Rehab Potential  Good    PT Frequency  2x / week    PT Duration  6 weeks    PT Treatment/Interventions  ADLs/Self Care Home Management;Cryotherapy;Electrical Stimulation;Iontophoresis 58m/ml Dexamethasone;Moist Heat;Ultrasound;Gait training;Functional mobility training;Therapeutic activities;Therapeutic exercise;Neuromuscular re-education;Patient/family education;Manual techniques;Passive range of motion;Dry needling;Taping    PT Next Visit Plan  RE authorization and Recertification complete 02/12/18,     PT Home Exercise Plan  towel DF stretch and towel scrunches evertors, invertors and toe flexion sls, heel raise and toe raise , wall sllides, goblet squat, single limb heel raise and clams with red t band    Consulted and Agree with Plan of Care  Patient       Patient will benefit from skilled therapeutic intervention in order to improve the following deficits and impairments:  Abnormal gait, Decreased mobility, Decreased range of motion, Difficulty walking, Decreased strength, Hypomobility, Increased fascial restricitons, Obesity, Postural dysfunction, Improper body mechanics, Pain  Visit Diagnosis: Pain in left ankle and joints of left foot  Difficulty in walking, not elsewhere classified  Muscle weakness (generalized)  Stiffness of left foot, not elsewhere classified     Problem List Patient Active Problem List   Diagnosis Date Noted  . CIN II (cervical intraepithelial neoplasia II) 01/02/2012    LVoncille Lo PT Certified Exercise Expert for the Aging Adult  02/12/18 2:59 PM Phone: [phone]: 3[facility] [address]Phone: [phone]  Fax:  [phone] Name: Melissa JMARQUITE ATTWOODMRN: [id number] of Birth: 4December 02, 1982 PHYSICAL THERAPY DISCHARGE SUMMARY  Visits from Start of Care: 4  Current functional level related to goals / functional outcomes: unknown   Remaining deficits: unknown   Education / Equipment: Initial HEP Plan: Patient agrees to discharge.  Patient goals were partially met. Patient is being discharged due to not returning since the last visit.  ?????    Pt rescheduled several times with multiple cancellations and 2 no shows. Pt also past recertification date and will  Need new order to continue  PT if wanted.    Voncille Lo, PT Certified Exercise Expert for the Aging Adult  04/02/18 5:41 PM Phone: [phone] Fax: [phone]

## 2018-02-17 ENCOUNTER — Encounter: Payer: Medicaid Other | Admitting: Physical Therapy

## 2018-02-19 ENCOUNTER — Ambulatory Visit: Payer: Medicaid Other | Admitting: Physical Therapy

## 2018-02-24 ENCOUNTER — Ambulatory Visit: Payer: Medicaid Other | Admitting: Physical Therapy

## 2018-02-26 ENCOUNTER — Telehealth: Payer: Self-pay | Admitting: Physical Therapy

## 2018-02-26 ENCOUNTER — Ambulatory Visit: Payer: Medicaid Other | Attending: Orthopedic Surgery | Admitting: Physical Therapy

## 2018-02-26 DIAGNOSIS — R0602 Shortness of breath: Secondary | ICD-10-CM | POA: Diagnosis not present

## 2018-02-26 NOTE — Telephone Encounter (Signed)
Tried to call and leave message for Melissa Guerra to alert her of her 12 appt approved from 02-26-18 to 04-08-18.  Pt mailbox was full and not accepting any messages.    Pt was scheduled for 02-26-18 and did not show for appt today.  Garen LahLawrie Javaris Wigington, PT Certified Exercise Expert for the Aging Adult  02/26/18 9:13 AM Phone: [phone] Fax: [phone]

## 2018-03-24 ENCOUNTER — Ambulatory Visit: Payer: Medicaid Other | Admitting: Physical Therapy

## 2018-04-08 ENCOUNTER — Encounter

## 2018-06-12 ENCOUNTER — Ambulatory Visit (HOSPITAL_COMMUNITY)
Admission: EM | Admit: 2018-06-12 | Discharge: 2018-06-12 | Disposition: A | Discharge status: Home / Self Care | Payer: Medicaid Other | Attending: Emergency Medicine | Admitting: Emergency Medicine

## 2018-06-12 ENCOUNTER — Encounter (HOSPITAL_COMMUNITY): Payer: Self-pay | Admitting: Emergency Medicine

## 2018-06-12 ENCOUNTER — Other Ambulatory Visit: Payer: Self-pay

## 2018-06-12 DIAGNOSIS — H9202 Otalgia, left ear: Secondary | ICD-10-CM | POA: Diagnosis not present

## 2018-06-12 DIAGNOSIS — J0140 Acute pansinusitis, unspecified: Secondary | ICD-10-CM

## 2018-06-12 DIAGNOSIS — L309 Dermatitis, unspecified: Secondary | ICD-10-CM | POA: Diagnosis not present

## 2018-06-12 MED ORDER — TRIAMCINOLONE ACETONIDE 0.1 % EX CREA: 1.0000 "application " | TOPICAL_CREAM | Freq: Two times a day (BID) | CUTANEOUS | 0 refills | Status: DC

## 2018-06-12 MED ORDER — AMOXICILLIN-POT CLAVULANATE 875-125 MG PO TABS: 1.0000 | ORAL_TABLET | Freq: Two times a day (BID) | ORAL | 0 refills | Status: DC

## 2018-06-12 MED ORDER — IBUPROFEN 600 MG PO TABS: 600.0000 mg | ORAL_TABLET | Freq: Four times a day (QID) | ORAL | 0 refills | Status: DC | PRN

## 2018-06-12 NOTE — Discharge Instructions (Addendum)
ibuprofen 600 mg to take with 1 g of Tylenol together 3 or 4 times a day as needed for pain.  Finish the Augmentin.  This will cover sinus infection as well as an ear infection.  Start saline nasal irrigation with distilled water and a Lloyd HugerNeil med sinus rinse.  May do this as often as you want.  See the directions on the box.  Continue your Flonase.  Try the triamcinolone for your rash.  You can also try Eucerin or Cerave lotion on this as well.  Below is a list of primary care practices who are taking new patients for you to follow-up with. [facility] [address] [place], [place] [address] [phone]  [facility] [phone] [address] [place] [address]  [facility]: [address] [place] [id number]  [phone]  [facility]: [address] [place] [address]   [phone]) [phone]  [facility]: [address] [place] [address]  [phone]  [place] [facility] : [address] [place] [phone]  [facility]: [address] [place] [phone]  [facility]: [address] [place] [phone]  Dr. Oneal GroutMahima Pandey [address] [facility] [address] [facility] [place] [address]  [phone]  Dr. Jackie PlumGeorge Osei-Bonsu, [facility]. [address] [place], [place] [address]  [phone]) [phone]  Go to www.goodrx.com to look up your medications. This will give you a list of where you can find your prescriptions at the most affordable prices. Or ask the pharmacist what the cash price is, or if they have any other discount programs available to help make your medication more affordable. This can be less expensive than what you would  pay with insurance.

## 2018-06-12 NOTE — ED Provider Notes (Signed)
HPI  SUBJECTIVE:  Melissa Guerra is a 37 y.o. female who presents with multiple complaints.  First she reports 2 weeks of left ear pain described as sore, achy, constant that is getting worse.  She reports slightly decreased hearing.  She states as if she feels as if she has fluid behind her eardrum.  She tried warm cloth, Advil 400 mg without improvement in her symptoms.  Symptoms are worse with talking.  She denies dental pain, grinding her teeth.  Her ear pain is not associated with yawning, chewing.  She denies otorrhea, fevers, recent swimming, foreign body insertion, facial rash.  No antibiotics in the past month.  She has taken Advil 400 mg within 4 to 6 hours.  Second, she reports purulent nasal congestion and sinus pain and pressure for over a week.  She denies fevers, upper dental pain, facial swelling.  She has been using her Flonase, Singulair and Zyrtec without improvement in her symptoms.  It is worse with bending forward and lying down.  Third, she reports an erythematous, papular itchy rash over her lateral left arm starting 1 week ago.  She denies burning, pain.  No new lotions, soaps, detergents.  No sun exposure.  She states that she is itching all day.  The itching is not worse at night.  The rash has not changed since it started.  No aggravating or alleviating factors.  She has not tried anything for this.  Past medical history negative for otitis media, TMJ arthralgia, diabetes, hypertension.  She has a past medical history of allergies, eczema, asthma.  LMP: July 1.  Denies the possibility of being pregnant.  PMD: None.   Past Medical History:  Diagnosis Date  . Abnormal Pap smear   . Acid reflux   . Asthma   . Obesity     Past Surgical History:  Procedure Laterality Date  . FOOT SURGERY    . TONSILLECTOMY    . TONSILLECTOMY AND ADENOIDECTOMY    . TONSILLECTOMY AND ADENOIDECTOMY    . WISDOM TOOTH EXTRACTION      Family History  Problem Relation Age of Onset  . Cancer  Mother   . HIV Mother   . Diabetes Maternal Grandmother   . Diabetes Paternal Grandmother   . Heart failure Father     Social History   Tobacco Use  . Smoking status: Former Smoker    Types: Cigarettes  . Smokeless tobacco: Never Used  Substance Use Topics  . Alcohol use: No  . Drug use: No    No current facility-administered medications for this encounter.   Current Outpatient Medications:  .  albuterol (ACCUNEB) 0.63 MG/3ML nebulizer solution, Take 1 ampule by nebulization every 6 (six) hours as needed for wheezing or shortness of breath., Disp: , Rfl:  .  albuterol (PROVENTIL HFA;VENTOLIN HFA) 108 (90 BASE) MCG/ACT inhaler, Inhale 2 puffs into the lungs every 6 (six) hours as needed for wheezing or shortness of breath. , Disp: , Rfl:  .  cetirizine (ZYRTEC) 10 MG tablet, Take 10 mg by mouth daily., Disp: , Rfl:  .  fluticasone (FLONASE) 50 MCG/ACT nasal spray, Place 2 sprays into both nostrils daily., Disp: 1 g, Rfl: 0 .  montelukast (SINGULAIR) 10 MG tablet, Take 10 mg by mouth at bedtime., Disp: , Rfl:  .  omeprazole (PRILOSEC) 20 MG capsule, Take 1 capsule (20 mg total) by mouth 2 (two) times daily before a meal., Disp: 60 capsule, Rfl: 0 .  Vitamin D, Ergocalciferol, (  DRISDOL) 50000 units CAPS capsule, Take 50,000 Units by mouth every 7 (seven) days., Disp: , Rfl:  .  amoxicillin-clavulanate (AUGMENTIN) 875-125 MG tablet, Take 1 tablet by mouth 2 (two) times daily for 7 days., Disp: 14 tablet, Rfl: 0 .  beclomethasone (QVAR) 80 MCG/ACT inhaler, Inhale 1 puff into the lungs 2 (two) times daily., Disp: , Rfl:  .  ibuprofen (ADVIL,MOTRIN) 600 MG tablet, Take 1 tablet (600 mg total) by mouth every 6 (six) hours as needed., Disp: 30 tablet, Rfl: 0 .  triamcinolone cream (KENALOG) 0.1 %, Apply 1 application topically 2 (two) times daily. Apply for 2 weeks. May use on face, Disp: 30 g, Rfl: 0  Allergies  Allergen Reactions  . Codeine Anaphylaxis and Other (See Comments)    Patient  was hospitalized   . Peanut-Containing Drug Products Hives and Itching     ROS  As noted in HPI.   Physical Exam  BP (!) 180/86 (BP Location: Right Arm)   Pulse 80   Temp 99 F (37.2 C) (Oral)   Resp 16   Ht 5\' 4"  (1.626 m)   Wt 280 lb (127 kg)   LMP 05/20/2018 (Approximate)   SpO2 100%   BMI 48.06 kg/m   Constitutional: Well developed, well nourished, no acute distress Eyes:  EOMI, conjunctiva normal bilaterally HENT: Normocephalic, atraumatic,mucus membranes moist. left external ear normal.  No tenderness of the mastoid.  no pain with traction on pinna or palpation of tragus.  External ear canal normal.  TM normal.  No tenderness over the TMJ.  No crepitus.  Normal dentition.  No dental tenderness.  Right TM normal. Positive maxillary and frontal sinus tenderness.  Erythematous, but not swollen turbinates.  Positive purulent nasal congestion.  Normal oropharynx.  No cobblestoning or postnasal drip. neck: No cervical lymphadenopathy Respiratory: Normal inspiratory effort Cardiovascular: Normal rate GI: nondistended skin: No facial rash, skin intact.  Positive erythematous papular rash over lateral arm in the area of tattoos with some excoriations.  No crusting, blisters.  No burrows between fingers.  See picture.    Musculoskeletal: no deformities Neurologic: Alert & oriented x 3, no focal neuro deficits Psychiatric: Speech and behavior appropriate   ED Course   Medications - No data to display  No orders of the defined types were placed in this encounter.   No results found for this or any previous visit (from the past 24 hour(s)). No results found.  ED Clinical Impression  Left ear pain  Acute non-recurrent pansinusitis  Eczema, unspecified type   ED Assessment/Plan  1.  Otalgia.  Does not appear to be an otitis media, otitis externa, TMJ dysfunction, dental infection.  No evidence of mastoiditis.  Unsure as to the etiology of her pain.  Will send home  with ibuprofen 600 mg to take with 1 g of Tylenol which should help with the pain.  Also sending home with Augmentin for her sinus infection, this will also cover any possible ear infection.  She will need to follow-up with ENT on call, [facility], if she still has pain after finishing all of her medicines.  2.  Sinusitis.  Given duration of symptoms, purulent nasal congestion and maxillary frontal sinus tenderness will send home with Augmentin.  She is to continue Flonase.  Advised saline nasal irrigation with a Lloyd Huger med sinus rinse and distilled water.  3.  Rash.  Suspect eczema.  Will send home with triamcinolone.  Providing primary care referral list for ongoing care.  Discussed  MDM, treatment plan, and plan for follow-up with patient. . patient agrees with plan.   Meds ordered this encounter  Medications  . amoxicillin-clavulanate (AUGMENTIN) 875-125 MG tablet    Sig: Take 1 tablet by mouth 2 (two) times daily for 7 days.    Dispense:  14 tablet    Refill:  0  . ibuprofen (ADVIL,MOTRIN) 600 MG tablet    Sig: Take 1 tablet (600 mg total) by mouth every 6 (six) hours as needed.    Dispense:  30 tablet    Refill:  0  . triamcinolone cream (KENALOG) 0.1 %    Sig: Apply 1 application topically 2 (two) times daily. Apply for 2 weeks. May use on face    Dispense:  30 g    Refill:  0    *This clinic note was created using Scientist, clinical (histocompatibility and immunogenetics). Therefore, there may be occasional mistakes despite careful proofreading.   ?   Domenick Gong, MD 06/12/18 1453

## 2018-06-12 NOTE — ED Triage Notes (Signed)
Patient states she is having left ear pain for about 2 weeks. Patient also states she has developed a rash on her left arm

## 2018-06-19 DIAGNOSIS — M19172 Post-traumatic osteoarthritis, left ankle and foot: Secondary | ICD-10-CM | POA: Diagnosis not present

## 2018-06-19 DIAGNOSIS — M79672 Pain in left foot: Secondary | ICD-10-CM | POA: Diagnosis not present

## 2018-06-30 ENCOUNTER — Other Ambulatory Visit: Payer: Self-pay

## 2018-06-30 ENCOUNTER — Emergency Department (HOSPITAL_COMMUNITY)
Admission: EM | Admit: 2018-06-30 | Discharge: 2018-07-01 | Disposition: A | Discharge status: Home / Self Care | Payer: Medicaid Other | Attending: Emergency Medicine | Admitting: Emergency Medicine

## 2018-06-30 ENCOUNTER — Encounter (HOSPITAL_COMMUNITY): Payer: Self-pay

## 2018-06-30 DIAGNOSIS — R1031 Right lower quadrant pain: Secondary | ICD-10-CM | POA: Insufficient documentation

## 2018-06-30 DIAGNOSIS — Z79899 Other long term (current) drug therapy: Secondary | ICD-10-CM | POA: Diagnosis not present

## 2018-06-30 DIAGNOSIS — Z8719 Personal history of other diseases of the digestive system: Secondary | ICD-10-CM | POA: Insufficient documentation

## 2018-06-30 DIAGNOSIS — R110 Nausea: Secondary | ICD-10-CM | POA: Insufficient documentation

## 2018-06-30 DIAGNOSIS — R1033 Periumbilical pain: Secondary | ICD-10-CM | POA: Diagnosis not present

## 2018-06-30 DIAGNOSIS — J45909 Unspecified asthma, uncomplicated: Secondary | ICD-10-CM | POA: Diagnosis not present

## 2018-06-30 DIAGNOSIS — M791 Myalgia, unspecified site: Secondary | ICD-10-CM | POA: Diagnosis not present

## 2018-06-30 DIAGNOSIS — R109 Unspecified abdominal pain: Secondary | ICD-10-CM

## 2018-06-30 LAB — CBC
HEMATOCRIT: 37.7 % (ref 36.0–46.0)
HEMOGLOBIN: 11.6 g/dL — AB (ref 12.0–15.0)
MCH: 26.1 pg (ref 26.0–34.0)
MCHC: 30.8 g/dL (ref 30.0–36.0)
MCV: 84.9 fL (ref 78.0–100.0)
Platelets: 295 10*3/uL (ref 150–400)
RBC: 4.44 MIL/uL (ref 3.87–5.11)
RDW: 15.5 % (ref 11.5–15.5)
WBC: 5.5 10*3/uL (ref 4.0–10.5)

## 2018-06-30 LAB — URINALYSIS, ROUTINE W REFLEX MICROSCOPIC
BACTERIA UA: NONE SEEN
BILIRUBIN URINE: NEGATIVE
Glucose, UA: NEGATIVE mg/dL
HGB URINE DIPSTICK: NEGATIVE
KETONES UR: NEGATIVE mg/dL
LEUKOCYTES UA: NEGATIVE
NITRITE: NEGATIVE
Protein, ur: NEGATIVE mg/dL
Specific Gravity, Urine: 1.013 (ref 1.005–1.030)
pH: 7 (ref 5.0–8.0)

## 2018-06-30 LAB — COMPREHENSIVE METABOLIC PANEL
ALBUMIN: 3.5 g/dL (ref 3.5–5.0)
ALK PHOS: 68 U/L (ref 38–126)
ALT: 15 U/L (ref 0–44)
AST: 15 U/L (ref 15–41)
Anion gap: 8 (ref 5–15)
BUN: <5 mg/dL — ABNORMAL LOW (ref 6–20)
CO2: 23 mmol/L (ref 22–32)
Calcium: 8.5 mg/dL — ABNORMAL LOW (ref 8.9–10.3)
Chloride: 107 mmol/L (ref 98–111)
Creatinine, Ser: 0.71 mg/dL (ref 0.44–1.00)
GFR calc Af Amer: >60 mL/min (ref >60)
GFR calc non Af Amer: >60 mL/min (ref >60)
GLUCOSE: 93 mg/dL (ref 70–99)
Potassium: 3.7 mmol/L (ref 3.5–5.1)
SODIUM: 138 mmol/L (ref 135–145)
TOTAL PROTEIN: 6.6 g/dL (ref 6.5–8.1)
Total Bilirubin: 1 mg/dL (ref 0.3–1.2)

## 2018-06-30 LAB — I-STAT BETA HCG BLOOD, ED (MC, WL, AP ONLY)

## 2018-06-30 LAB — LIPASE, BLOOD: Lipase: 28 U/L (ref 11–51)

## 2018-06-30 NOTE — ED Triage Notes (Signed)
Pt presents with 2 day h/o umbilical pain.  Pt reports pain has been constant and radiates to R side.  Pt reports vomiting yesterday but none today; +loose stools with blood-tinged a few days ago.  Pt reports increased weakness and fatigue.

## 2018-07-01 MED ORDER — GI COCKTAIL ~~LOC~~
30.0000 mL | Freq: Once | ORAL | Status: AC
  Administered 2018-07-01: 30 mL via ORAL
  Filled 2018-07-01: qty 30

## 2018-07-01 MED ORDER — ONDANSETRON 4 MG PO TBDP: 4.0000 mg | ORAL_TABLET | Freq: Three times a day (TID) | ORAL | 0 refills | Status: AC | PRN

## 2018-07-01 MED ORDER — ONDANSETRON 4 MG PO TBDP
4.0000 mg | ORAL_TABLET | Freq: Once | ORAL | Status: AC
Start: 2018-07-01 — End: 2018-07-01
  Administered 2018-07-01: 4 mg via ORAL
  Filled 2018-07-01: qty 1

## 2018-07-01 NOTE — ED Notes (Signed)
ED Provider at bedside. 

## 2018-07-01 NOTE — ED Provider Notes (Signed)
[facility]  CSN: [id number] Arrival date & time: 06/30/18 1806  Chief Complaint(s) Abdominal Pain  HPI Melissa Guerra is a 37 y.o. female   The history is provided by the patient.  Abdominal Pain   This is a new problem. The current episode started 2 days ago. The problem occurs constantly. Progression since onset: fluctuating. The pain is associated with an unknown factor. The pain is located in the periumbilical region and RLQ. The quality of the pain is aching and cramping. The pain is moderate. Associated symptoms include nausea and myalgias. Pertinent negatives include fever, hematochezia and vomiting. Nothing aggravates the symptoms. Nothing relieves the symptoms.   Reports possible suspicious food intake several hours prior to symptom onset.  LMP: 06/19/2018   Past Medical History Past Medical History:  Diagnosis Date  . Abnormal Pap smear   . Acid reflux   . Asthma   . Obesity    Patient Active Problem List   Diagnosis Date Noted  . 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 Authorizing Provider  albuterol (ACCUNEB) 0.63 MG/3ML nebulizer solution Take 1 ampule by nebulization every 6 (six) hours as needed for wheezing or shortness of breath.   Yes [provider]  albuterol (PROVENTIL HFA;VENTOLIN HFA) 108 (90 BASE) MCG/ACT inhaler Inhale 2 puffs into the lungs every 6 (six) hours as needed for wheezing or shortness of breath.    Yes [provider]  beclomethasone (QVAR) 80 MCG/ACT inhaler Inhale 1 puff into the lungs 2 (two) times daily.   Yes [provider]  cetirizine (ZYRTEC) 10 MG tablet Take 10 mg by mouth daily.   Yes [provider]  fluticasone (FLONASE) 50 MCG/ACT nasal spray Place 2 sprays into both nostrils daily. 07/01/17  Yes Yu, Amy V, PA-C  montelukast  (SINGULAIR) 10 MG tablet Take 10 mg by mouth at bedtime.   Yes [provider]  Vitamin D, Ergocalciferol, (DRISDOL) 50000 units CAPS capsule Take 50,000 Units by mouth every 7 (seven) days.   Yes [provider]  ibuprofen (ADVIL,MOTRIN) 600 MG tablet Take 1 tablet (600 mg total) by mouth every 6 (six) hours as needed. Patient not taking: Reported on 07/01/2018 06/12/18   Domenick GongMortenson, Ashley, MD  omeprazole (PRILOSEC) 20 MG capsule Take 1 capsule (20 mg total) by mouth 2 (two) times daily before a meal. Patient not taking: Reported on 07/01/2018 01/09/14   Toy Cookeyocherty, Megan, MD  ondansetron (ZOFRAN ODT) 4 MG disintegrating tablet Take 1 tablet (4 mg total) by mouth every 8 (eight) hours as needed for up to 5 days for nausea or vomiting. 07/01/18 07/06/18  Nira Connardama, Joei Frangos Eduardo, MD  triamcinolone cream (KENALOG) 0.1 % Apply 1 application topically 2 (two) times daily. Apply for 2 weeks. May use on face Patient not taking: Reported on 07/01/2018 06/12/18   Domenick GongMortenson, Ashley, MD  Past Surgical History Past Surgical History:  Procedure Laterality Date  . FOOT SURGERY    . TONSILLECTOMY    . TONSILLECTOMY AND ADENOIDECTOMY    . TONSILLECTOMY AND ADENOIDECTOMY    . WISDOM TOOTH EXTRACTION     Family History Family History  Problem Relation Age of Onset  . Cancer Mother   . HIV Mother   . Diabetes Maternal Grandmother   . Diabetes Paternal Grandmother   . Heart failure Father     Social History Social History   Tobacco Use  . Smoking status: Former Smoker    Types: Cigarettes  . Smokeless tobacco: Never Used  Substance Use Topics  . Alcohol use: No  . Drug use: No   Allergies Codeine and Peanut-containing drug products  Review of Systems Review of Systems  Constitutional: Negative for fever.  Gastrointestinal: Positive for abdominal pain and  nausea. Negative for hematochezia and vomiting.  Musculoskeletal: Positive for myalgias.   All other systems are reviewed and are negative for acute change except as noted in the HPI  Physical Exam Vital Signs  I have reviewed the triage vital signs BP 128/66   Pulse 82   Temp (!) 100.5 F (38.1 C) (Oral)   Resp 13   LMP 06/19/2018 (Exact Date)   SpO2 100%   Physical Exam  Constitutional: She is oriented to person, place, and time. She appears well-developed and well-nourished. No distress.  HENT:  Head: Normocephalic and atraumatic.  Right Ear: External ear normal.  Left Ear: External ear normal.  Nose: Nose normal.  Eyes: Conjunctivae and EOM are normal. No scleral icterus.  Neck: Normal range of motion and phonation normal.  Cardiovascular: Normal rate and regular rhythm.  Pulmonary/Chest: Effort normal. No stridor. No respiratory distress.  Abdominal: She exhibits no distension. There is no tenderness. There is no rigidity, no guarding, no tenderness at McBurney's point and negative Murphy's sign. No hernia.  Musculoskeletal: Normal range of motion. She exhibits no edema.  Neurological: She is alert and oriented to person, place, and time.  Skin: She is not diaphoretic.  Psychiatric: She has a normal mood and affect. Her behavior is normal.  Vitals reviewed.   ED Results and Treatments Labs (all labs ordered are listed, but only abnormal results are displayed) Labs Reviewed  COMPREHENSIVE METABOLIC PANEL - Abnormal; Notable for the following components:      Result Value   BUN <5 (*)    Calcium 8.5 (*)    All other components within normal limits  CBC - Abnormal; Notable for the following components:   Hemoglobin 11.6 (*)    All other components within normal limits  LIPASE, BLOOD  URINALYSIS, ROUTINE W REFLEX MICROSCOPIC  I-STAT BETA HCG BLOOD, ED ([facility], [facility], [facility] ONLY)                                                                                                                          EKG  EKG Interpretation  Date/Time:    Ventricular Rate:    PR Interval:    QRS Duration:   QT Interval:    QTC Calculation:   R Axis:     Text Interpretation:        Radiology No results found. Pertinent labs & imaging results that were available during my care of the patient were reviewed by me and considered in my medical decision making (see chart for details).  Medications Ordered in ED Medications  gi cocktail (Maalox,Lidocaine,Donnatal) (has no administration in time range)  ondansetron (ZOFRAN-ODT) disintegrating tablet 4 mg (has no administration in time range)                                                                                                                                    Procedures Procedures  (including critical care time)  Medical Decision Making / ED Course I have reviewed the nursing notes for this encounter and the patient's prior records (if available in EHR or on provided paperwork).    37 y.o. female presents with abd discomfort, myalgia, and low grade fever for 2 days. Possible suspicious food intake. adequate oral tolerance. Rest of history as above.  Patient appears well, not in distress, and with no signs of toxicity or dehydration. Abdomen benign.  Rest of the exam as above  Most consistent with viral gastroenteritis vs food poisoning.   Doubt appendicitis, diverticulitis, severe colitis, dysentery.    Able to tolerate oral intake in the ED.  Discussed symptomatic treatment with the patient and they will follow closely with their PCP.   Final Clinical Impression(s) / ED Diagnoses Final diagnoses:  Abdominal discomfort  Nausea  Myalgia   Disposition: Discharge  Condition: Good  I have discussed the results, Dx and Tx plan with the patient who expressed understanding and agree(s) with the plan. Discharge instructions discussed at great length. The patient was given strict return precautions who  verbalized understanding of the instructions. No further questions at time of discharge.    ED Discharge Orders         Ordered    ondansetron (ZOFRAN ODT) 4 MG disintegrating tablet  Every 8 hours PRN     07/01/18 0020           Follow Up: Primary care provider   If you do not have a primary care physician, contact [facility] at [phone] for referral      This chart was dictated using voice recognition software.  Despite best efforts to proofread,  errors can occur which can change the documentation meaning.   Nira Conn, MD 07/01/18 0020

## 2018-07-01 NOTE — ED Notes (Signed)
Signature pad unavailable at time of pt discharge. Pt verbalized understanding of d/c instructions and prescriptions. Pt confirmed pharmacy.

## 2018-08-12 ENCOUNTER — Encounter (HOSPITAL_COMMUNITY): Payer: Self-pay | Admitting: Emergency Medicine

## 2018-08-12 ENCOUNTER — Other Ambulatory Visit: Payer: Self-pay

## 2018-08-12 ENCOUNTER — Ambulatory Visit (HOSPITAL_COMMUNITY)
Admission: EM | Admit: 2018-08-12 | Discharge: 2018-08-12 | Disposition: A | Discharge status: Home / Self Care | Payer: Self-pay | Attending: Family Medicine | Admitting: Family Medicine

## 2018-08-12 DIAGNOSIS — J069 Acute upper respiratory infection, unspecified: Secondary | ICD-10-CM

## 2018-08-12 DIAGNOSIS — H66002 Acute suppurative otitis media without spontaneous rupture of ear drum, left ear: Secondary | ICD-10-CM

## 2018-08-12 DIAGNOSIS — L309 Dermatitis, unspecified: Secondary | ICD-10-CM

## 2018-08-12 MED ORDER — TRIAMCINOLONE ACETONIDE 0.1 % EX CREA: 1.0000 "application " | TOPICAL_CREAM | Freq: Two times a day (BID) | CUTANEOUS | 0 refills | Status: DC

## 2018-08-12 MED ORDER — AMOXICILLIN 875 MG PO TABS: 875.0000 mg | ORAL_TABLET | Freq: Two times a day (BID) | ORAL | 0 refills | Status: DC

## 2018-08-12 NOTE — ED Provider Notes (Signed)
[facility]ENTER   [id number] 08/12/18 Arrival Time: 1658  ASSESSMENT & PLAN:  1. Viral upper respiratory tract infection   2. Non-recurrent acute suppurative otitis media of left ear without spontaneous rupture of tympanic membrane   3. Eczema, unspecified type     Meds ordered this encounter  Medications  . triamcinolone cream (KENALOG) 0.1 %    Sig: Apply 1 application topically 2 (two) times daily. Apply for 2 weeks. May use on face    Dispense:  30 g    Refill:  0  . amoxicillin (AMOXIL) 875 MG tablet    Sig: Take 1 tablet (875 mg total) by mouth 2 (two) times daily for 10 days.    Dispense:  20 tablet    Refill:  0   Discussed typical duration of symptoms. OTC symptom care as needed. Ensure adequate fluid intake and rest. May f/u with PCP or here as needed.  Reviewed expectations re: course of current medical issues. Questions answered. Outlined signs and symptoms indicating need for more acute intervention. Patient verbalized understanding. After Visit Summary given.   SUBJECTIVE: History from: patient.  Nasim J Fuerstenberg is a 37 y.o. female who presents with complaint of nasal congestion, post-nasal drainage, and sinus pain. Onset gradual, approximately several days ago. Respiratory symptoms: occasional coughing. Fever: no. Overall normal PO intake without n/v. OTC treatment: Tylenol without much help. Past few days has noticed increasing pressure/discomfort of her left ear. No drainage. Hearing normal. History of frequent sinus infections: no. Social History   Tobacco Use  Smoking Status Former Smoker  . Types: Cigarettes  Smokeless Tobacco Never Used   Also reports exacerbation of small bumps on fingers. H/O similar. Itching. Over few weeks. No OTC tx. Does not limit her. No new skin exposures reported. No known triggers. No OTC tx.  ROS: As per HPI.   OBJECTIVE:  Vitals:   08/12/18 1726  BP: (!) 137/91  Pulse: 78  Temp: 98.5 F (36.9 C)    TempSrc: Oral  SpO2: 100%    General appearance: alert; appears fatigued HEENT: nasal congestion; clear runny nose; throat irritation secondary to post-nasal drainage; bilateral maxillary tenderness to palpation; turbinates boggy; left TM erythematous and slightly bulging Neck: supple without LAD CV: RRR Lungs: unlabored respirations, symmetrical air entry; cough: mild; no respiratory distress Skin: warm and dry; small patches of eczema on fingers bilaterally Ext: no edema Psychological: alert and cooperative; normal mood and affect  Allergies  Allergen Reactions  . Codeine Anaphylaxis and Other (See Comments)    Patient was hospitalized   . Peanut-Containing Drug Products Hives and Itching    Past Medical History:  Diagnosis Date  . Abnormal Pap smear   . Acid reflux   . Asthma   . Obesity    Family History  Problem Relation Age of Onset  . Cancer Mother   . HIV Mother   . Diabetes Maternal Grandmother   . Diabetes Paternal Grandmother   . Heart failure Father    Social History   Socioeconomic History  . Marital status: Single    Spouse name: Not on file  . Number of children: Not on file  . Years of education: Not on file  . Highest education level: Not on file  Occupational History  . Not on file  Social Needs  . Financial resource strain: Not on file  . Food insecurity:    Worry: Not on file    Inability: Not on file  . Transportation needs:  Medical: Not on file    Non-medical: Not on file  Tobacco Use  . Smoking status: Former Smoker    Types: Cigarettes  . Smokeless tobacco: Never Used  Substance and Sexual Activity  . Alcohol use: No  . Drug use: No  . Sexual activity: Yes    Birth control/protection: Other-see comments  Lifestyle  . Physical activity:    Days per week: Not on file    Minutes per session: Not on file  . Stress: Not on file  Relationships  . Social connections:    Talks on phone: Not on file    Gets together: Not on file     Attends religious service: Not on file    Active member of club or organization: Not on file    Attends meetings of clubs or organizations: Not on file    Relationship status: Not on file  . Intimate partner violence:    Fear of current or ex partner: Not on file    Emotionally abused: Not on file    Physically abused: Not on file    Forced sexual activity: Not on file  Other Topics Concern  . Not on file  Social History Narrative  . Not on file            Mardella Layman, MD 08/20/18 1145

## 2018-08-12 NOTE — ED Triage Notes (Signed)
Pt reports sinus pressure and bilateral ear pain with fatigue.  She also reports small bumps on her fingers.

## 2018-08-21 ENCOUNTER — Ambulatory Visit (HOSPITAL_COMMUNITY)
Admission: EM | Admit: 2018-08-21 | Discharge: 2018-08-21 | Disposition: A | Discharge status: Home / Self Care | Payer: Self-pay | Attending: Family Medicine | Admitting: Family Medicine

## 2018-08-21 ENCOUNTER — Encounter (HOSPITAL_COMMUNITY): Payer: Self-pay | Admitting: Family Medicine

## 2018-08-21 ENCOUNTER — Other Ambulatory Visit: Payer: Self-pay

## 2018-08-21 DIAGNOSIS — L03312 Cellulitis of back [any part except buttock]: Secondary | ICD-10-CM

## 2018-08-21 DIAGNOSIS — L03313 Cellulitis of chest wall: Secondary | ICD-10-CM

## 2018-08-21 MED ORDER — DOXYCYCLINE HYCLATE 100 MG PO TABS: 100.0000 mg | ORAL_TABLET | Freq: Two times a day (BID) | ORAL | 0 refills | Status: DC

## 2018-08-21 NOTE — ED Provider Notes (Signed)
[facility]    CSN: [id number] Arrival date & time: 08/21/18  1452     History   Chief Complaint Chief Complaint  Patient presents with  . Abscess    left axilla  . Insect Bite    mid back    HPI Melissa Guerra is a 37 y.o. female.   Pt reports feeling a bug bite her last night on her back.  She reports a bump in the area.  She also noticed a swollen spot under her left axilla this morning.  Patient was just seen 9 days ago and treated for an ear infection with amoxicillin.  Patient has a 36-year-old at home and she works at [employer]     Past Medical History:  Diagnosis Date  . Abnormal Pap smear   . Acid reflux   . Asthma   . Obesity     Patient Active Problem List   Diagnosis Date Noted  . 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 FOOT SURGERY    . TONSILLECTOMY    . TONSILLECTOMY AND ADENOIDECTOMY    . TONSILLECTOMY AND ADENOIDECTOMY    . WISDOM TOOTH EXTRACTION      OB History    Gravida  2   Para  2   Term      Preterm      AB      Living        SAB      TAB      Ectopic      Multiple      Live Births               Home Medications    Prior to Admission medications   Medication Sig Start Date End Date Taking? Authorizing Provider  albuterol (ACCUNEB) 0.63 MG/3ML nebulizer solution Take 1 ampule by nebulization every 6 (six) hours as needed for wheezing or shortness of breath.   Yes [provider]  albuterol (PROVENTIL HFA;VENTOLIN HFA) 108 (90 BASE) MCG/ACT inhaler Inhale 2 puffs into the lungs every 6 (six) hours as needed for wheezing or shortness of breath.    Yes [provider]  beclomethasone (QVAR) 80 MCG/ACT inhaler Inhale 1 puff into the lungs 2 (two) times daily.   Yes [provider]  cetirizine (ZYRTEC) 10 MG tablet Take 10 mg by mouth daily.   Yes [provider]  fluticasone (FLONASE) 50  MCG/ACT nasal spray Place 2 sprays into both nostrils daily. 07/01/17  Yes Yu, Amy V, PA-C  ibuprofen (ADVIL,MOTRIN) 600 MG tablet Take 1 tablet (600 mg total) by mouth every 6 (six) hours as needed. 06/12/18  Yes Domenick Gong, MD  montelukast (SINGULAIR) 10 MG tablet Take 10 mg by mouth at bedtime.   Yes [provider]  omeprazole (PRILOSEC) 20 MG capsule Take 1 capsule (20 mg total) by mouth 2 (two) times daily before a meal. 01/09/14  Yes Toy Cookey, MD  triamcinolone cream (KENALOG) 0.1 % Apply 1 application topically 2 (two) times daily. Apply for 2 weeks. May use on face 08/12/18  Yes Hagler, Arlys John, MD  Vitamin D, Ergocalciferol, (DRISDOL) 50000 units CAPS capsule Take 50,000 Units by mouth every 7 (seven) days.   Yes [provider]  doxycycline (VIBRA-TABS) 100 MG tablet Take 1 tablet (100 mg total) by mouth 2 (two) times daily. 08/21/18   Elvina Sidle, MD    Family History Family History  Problem Relation Age of Onset  . Cancer Mother   . HIV Mother   . Diabetes Maternal Grandmother   . Diabetes Paternal Grandmother   . Heart failure Father     Social History Social History   Tobacco Use  . Smoking status: Former Smoker    Types: Cigarettes  . Smokeless tobacco: Never Used  Substance Use Topics  . Alcohol use: No  . Drug use: No     Allergies   Codeine and Peanut-containing drug products   Review of Systems Review of Systems   Physical Exam Triage Vital Signs ED Triage Vitals  Enc Vitals Group     BP 08/21/18 1522 (!) 138/54     Pulse Rate 08/21/18 1522 86     Resp --      Temp 08/21/18 1522 99 F (37.2 C)     Temp Source 08/21/18 1522 Oral     SpO2 --      Weight --      Height --      Head Circumference --      Peak Flow --      Pain Score 08/21/18 1523 9     Pain Loc --      Pain Edu? --      Excl. in GC? --    No data found.  Updated Vital Signs BP (!) 138/54 (BP Location: Right Wrist)   Pulse 86   Temp 99 F  (37.2 C) (Oral)   LMP 08/14/2018 (Approximate)    Physical Exam  Constitutional: She is oriented to person, place, and time. She appears well-developed and well-nourished.  HENT:  Right Ear: External ear normal.  Left Ear: External ear normal.  Mouth/Throat: Oropharynx is clear and moist.  TMs are normal  Eyes: Pupils are equal, round, and reactive to light. Conjunctivae are normal.  Neck: Normal range of motion.  Pulmonary/Chest: Effort normal.  Musculoskeletal: Normal range of motion.  Neurological: She is alert and oriented to person, place, and time.  Skin: Skin is warm and dry.  Half centimeter pustule at T12 with no significant fluctuance or induration Left axillary pustule with no induration or fluctuance  Psychiatric: She has a normal mood and affect.  Nursing note and vitals reviewed.    UC Treatments / Results  Labs (all labs ordered are listed, but only abnormal results are displayed) Labs Reviewed - No data to display  EKG None  Radiology No results found.  Procedures Procedures (including critical care time)  Medications Ordered in UC Medications - No data to display  Initial Impression / Assessment and Plan / UC Course  I have reviewed the triage vital signs and the nursing notes.  Pertinent labs & imaging results that were available during my care of the patient were reviewed by me and considered in my medical decision making (see chart for details).     Final Clinical Impressions(s) / UC Diagnoses   Final diagnoses:  Cellulitis of back except buttock  Cellulitis of chest wall     Discharge Instructions     Let hot water and soap in the shower run over these areas of pain in your left armpit and back.    ED Prescriptions    Medication Sig Dispense Auth. Provider   doxycycline (VIBRA-TABS) 100 MG tablet Take 1 tablet (100 mg total) by mouth 2 (two) times daily. 20 tablet Elvina Sidle, MD     Controlled Substance Prescriptions [place]  Controlled Substance Registry consulted? Not Applicable  Elvina Sidle, MD 08/21/18 1538

## 2018-08-21 NOTE — Discharge Instructions (Signed)
Let hot water and soap in the shower run over these areas of pain in your left armpit and back.

## 2018-08-21 NOTE — ED Triage Notes (Signed)
Pt reports feeling a bug bite her last night on her back.  She repots a bump in the area.  She also noticed a swollen spot under her left axilla this morning.

## 2018-09-05 ENCOUNTER — Telehealth (HOSPITAL_COMMUNITY): Payer: Self-pay

## 2018-09-05 ENCOUNTER — Ambulatory Visit (HOSPITAL_COMMUNITY)
Admission: EM | Admit: 2018-09-05 | Discharge: 2018-09-05 | Disposition: A | Discharge status: Home / Self Care | Payer: Medicaid Other | Attending: Family Medicine | Admitting: Family Medicine

## 2018-09-05 DIAGNOSIS — L231 Allergic contact dermatitis due to adhesives: Secondary | ICD-10-CM

## 2018-09-05 DIAGNOSIS — Z91040 Latex allergy status: Secondary | ICD-10-CM

## 2018-09-05 MED ORDER — PREDNISONE 20 MG PO TABS: ORAL_TABLET | ORAL | 0 refills | Status: DC

## 2018-09-05 NOTE — Discharge Instructions (Addendum)
The square dermatitis on your back appears to be a reaction to the Band-Aid that was there which very likely is a reaction to the latex in the Band-Aid.

## 2018-09-05 NOTE — ED Provider Notes (Signed)
[facility]    CSN: [id number] Arrival date & time: 09/05/18  1702     History   Chief Complaint No chief complaint on file.   HPI Melissa Guerra is a 37 y.o. female.      This 37 year old woman was seen 15 days ago at this office and started on doxycycline for what appeared to be an infected insect bite.     Past Medical History:  Diagnosis Date  . Abnormal Pap smear   . Acid reflux   . Asthma   . Obesity     Patient Active Problem List   Diagnosis Date Noted  . 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 FOOT SURGERY    . TONSILLECTOMY    . TONSILLECTOMY AND ADENOIDECTOMY    . TONSILLECTOMY AND ADENOIDECTOMY    . WISDOM TOOTH EXTRACTION      OB History    Gravida  2   Para  2   Term      Preterm      AB      Living        SAB      TAB      Ectopic      Multiple      Live Births               Home Medications    Prior to Admission medications   Medication Sig Start Date End Date Taking? Authorizing Provider  albuterol (ACCUNEB) 0.63 MG/3ML nebulizer solution Take 1 ampule by nebulization every 6 (six) hours as needed for wheezing or shortness of breath.    [provider]  albuterol (PROVENTIL HFA;VENTOLIN HFA) 108 (90 BASE) MCG/ACT inhaler Inhale 2 puffs into the lungs every 6 (six) hours as needed for wheezing or shortness of breath.     [provider]  beclomethasone (QVAR) 80 MCG/ACT inhaler Inhale 1 puff into the lungs 2 (two) times daily.    [provider]  cetirizine (ZYRTEC) 10 MG tablet Take 10 mg by mouth daily.    [provider]  doxycycline (VIBRA-TABS) 100 MG tablet Take 1 tablet (100 mg total) by mouth 2 (two) times daily. 08/21/18   Elvina Sidle, MD  fluticasone (FLONASE) 50 MCG/ACT nasal spray Place 2 sprays into both nostrils daily. 07/01/17   Cathie Hoops, Amy V, PA-C  ibuprofen (ADVIL,MOTRIN) 600 MG  tablet Take 1 tablet (600 mg total) by mouth every 6 (six) hours as needed. 06/12/18   Domenick Gong, MD  montelukast (SINGULAIR) 10 MG tablet Take 10 mg by mouth at bedtime.    [provider]  omeprazole (PRILOSEC) 20 MG capsule Take 1 capsule (20 mg total) by mouth 2 (two) times daily before a meal. 01/09/14   Toy Cookey, MD  triamcinolone cream (KENALOG) 0.1 % Apply 1 application topically 2 (two) times daily. Apply for 2 weeks. May use on face 08/12/18   Mardella Layman, MD  Vitamin D, Ergocalciferol, (DRISDOL) 50000 units CAPS capsule Take 50,000 Units by mouth every 7 (seven) days.    [provider]    Family History Family History  Problem Relation Age of Onset  . Cancer Mother   . HIV Mother   . Diabetes Maternal Grandmother   . Diabetes Paternal Grandmother   . Heart failure Father     Social History Social History   Tobacco Use  . Smoking status: Former Smoker  Types: Cigarettes  . Smokeless tobacco: Never Used  Substance Use Topics  . Alcohol use: No  . Drug use: No     Allergies   Codeine and Peanut-containing drug products   Review of Systems Review of Systems   Physical Exam Triage Vital Signs ED Triage Vitals  Enc Vitals Group     BP      Pulse      Resp      Temp      Temp src      SpO2      Weight      Height      Head Circumference      Peak Flow      Pain Score      Pain Loc      Pain Edu?      Excl. in GC?    No data found.  Updated Vital Signs BP 115/74 (BP Location: Right Arm)   Pulse 76   Temp 98.2 F (36.8 C) (Oral)   Resp 16   Wt 127 kg   LMP 08/14/2018 (Approximate)   SpO2 100%   BMI 48.06 kg/m   Physical Exam  Constitutional: She is oriented to person, place, and time. She appears well-developed and well-nourished.  HENT:  Right Ear: External ear normal.  Left Ear: External ear normal.  Mouth/Throat: Oropharynx is clear and moist.  Eyes: Conjunctivae are normal.  Neck: Normal range of  motion. Neck supple.  Cardiovascular: Normal rate, regular rhythm and normal heart sounds.  Pulmonary/Chest: Effort normal and breath sounds normal.  Musculoskeletal: Normal range of motion.  Neurological: She is alert and oriented to person, place, and time.  Skin: Rash noted.  Nursing note and vitals reviewed.        UC Treatments / Results  Labs (all labs ordered are listed, but only abnormal results are displayed) Labs Reviewed - No data to display  EKG None  Radiology No results found.  Procedures Procedures (including critical care time)  Medications Ordered in UC Medications - No data to display  Initial Impression / Assessment and Plan / UC Course  I have reviewed the triage vital signs and the nursing notes.  Pertinent labs & imaging results that were available during my care of the patient were reviewed by me and considered in my medical decision making (see chart for details).    Final Clinical Impressions(s) / UC Diagnoses   Final diagnoses:  Allergic contact dermatitis due to adhesives  Latex allergy     Discharge Instructions     The square dermatitis on your back appears to be a reaction to the Band-Aid that was there which very likely is a reaction to the latex in the Band-Aid.    ED Prescriptions    None     Controlled Substance Prescriptions [place] Controlled Substance Registry consulted? Not Applicable   Elvina Sidle, MD 09/05/18 1806

## 2018-09-05 NOTE — ED Triage Notes (Signed)
Pt states she has a rash on her face. This started last night.

## 2018-10-02 ENCOUNTER — Emergency Department (HOSPITAL_COMMUNITY): Payer: Medicaid Other

## 2018-10-02 ENCOUNTER — Encounter (HOSPITAL_COMMUNITY): Payer: Self-pay

## 2018-10-02 ENCOUNTER — Other Ambulatory Visit: Payer: Self-pay

## 2018-10-02 ENCOUNTER — Emergency Department (HOSPITAL_COMMUNITY)
Admission: EM | Admit: 2018-10-02 | Discharge: 2018-10-02 | Disposition: A | Discharge status: Home / Self Care | Payer: Medicaid Other | Attending: Emergency Medicine | Admitting: Emergency Medicine

## 2018-10-02 DIAGNOSIS — Z87891 Personal history of nicotine dependence: Secondary | ICD-10-CM | POA: Insufficient documentation

## 2018-10-02 DIAGNOSIS — J069 Acute upper respiratory infection, unspecified: Secondary | ICD-10-CM

## 2018-10-02 DIAGNOSIS — R05 Cough: Secondary | ICD-10-CM | POA: Insufficient documentation

## 2018-10-02 DIAGNOSIS — R0981 Nasal congestion: Secondary | ICD-10-CM | POA: Diagnosis present

## 2018-10-02 DIAGNOSIS — B9789 Other viral agents as the cause of diseases classified elsewhere: Secondary | ICD-10-CM | POA: Diagnosis not present

## 2018-10-02 DIAGNOSIS — R197 Diarrhea, unspecified: Secondary | ICD-10-CM | POA: Insufficient documentation

## 2018-10-02 DIAGNOSIS — Z91010 Allergy to peanuts: Secondary | ICD-10-CM | POA: Insufficient documentation

## 2018-10-02 DIAGNOSIS — J45909 Unspecified asthma, uncomplicated: Secondary | ICD-10-CM | POA: Insufficient documentation

## 2018-10-02 DIAGNOSIS — H9209 Otalgia, unspecified ear: Secondary | ICD-10-CM | POA: Insufficient documentation

## 2018-10-02 DIAGNOSIS — Z79899 Other long term (current) drug therapy: Secondary | ICD-10-CM | POA: Diagnosis not present

## 2018-10-02 MED ORDER — ALBUTEROL SULFATE HFA 108 (90 BASE) MCG/ACT IN AERS
2.0000 | INHALATION_SPRAY | RESPIRATORY_TRACT | Status: DC
  Administered 2018-10-02: 2 via RESPIRATORY_TRACT
  Filled 2018-10-02: qty 6.7

## 2018-10-02 NOTE — ED Triage Notes (Addendum)
Patient c/o nasal congestion, left ear pain, and a productive cough with green sputum x 3-4 days. Patient has been taking Robitussin and OTC cold/flu meds.  Patient states she was incontinent of diarrhea today and has mid abdominal cramping.

## 2018-10-02 NOTE — Discharge Instructions (Signed)
Use the albuterol as needed for cough

## 2018-10-02 NOTE — ED Provider Notes (Signed)
[facility]-EMERGENCY DEPT Provider Note   CSN: [id number] Arrival date & time: 10/02/18  [id number]     History   Chief Complaint Chief Complaint  Patient presents with  . Nasal Congestion  . Cough  . Diarrhea  . Otalgia  . Abdominal Pain    HPI Tiwanna J Caswell CorwinStubbs is a 37 y.o. female.  HPI Patient is a 37 year old female presents the emergency department with complaints of nasal congestion ear pain productive cough for the past 3 to 4 days.  She also had some diarrhea yesterday.  Denies nausea vomiting.  No abdominal pain.  No chest pain.  She reports some cough.  Symptoms are mild in severity.  No recent sick contacts.   Past Medical History:  Diagnosis Date  . Abnormal Pap smear   . Acid reflux   . Asthma   . Obesity     Patient Active Problem List   Diagnosis Date Noted  . 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 FOOT SURGERY    . TONSILLECTOMY    . TONSILLECTOMY AND ADENOIDECTOMY    . TONSILLECTOMY AND ADENOIDECTOMY    . WISDOM TOOTH EXTRACTION       OB History    Gravida  2   Para  2   Term      Preterm      AB      Living        SAB      TAB      Ectopic      Multiple      Live Births               Home Medications    Prior to Admission medications   Medication Sig Start Date End Date Taking? Authorizing Provider  albuterol (ACCUNEB) 0.63 MG/3ML nebulizer solution Take 1 ampule by nebulization every 6 (six) hours as needed for wheezing or shortness of breath.   Yes [provider]  albuterol (PROVENTIL HFA;VENTOLIN HFA) 108 (90 BASE) MCG/ACT inhaler Inhale 2 puffs into the lungs every 6 (six) hours as needed for wheezing or shortness of breath.    Yes [provider]  beclomethasone (QVAR) 80 MCG/ACT inhaler Inhale 1 puff into the lungs 2 (two) times daily.   Yes [provider]  cetaphil (CETAPHIL) lotion Apply 1  application topically as needed for dry skin.   Yes [provider]  cetirizine (ZYRTEC) 10 MG tablet Take 10 mg by mouth daily.   Yes [provider]  fluticasone (FLONASE) 50 MCG/ACT nasal spray Place 2 sprays into both nostrils daily. 07/01/17  Yes Yu, Amy V, PA-C  ibuprofen (ADVIL,MOTRIN) 200 MG tablet Take 400 mg by mouth every 6 (six) hours as needed for headache or mild pain.   Yes [provider]  montelukast (SINGULAIR) 10 MG tablet Take 10 mg by mouth at bedtime.   Yes [provider]  omeprazole (PRILOSEC) 20 MG capsule Take 1 capsule (20 mg total) by mouth 2 (two) times daily before a meal. 01/09/14  Yes Toy Cookeyocherty, Megan, MD  triamcinolone cream (KENALOG) 0.1 % Apply 1 application topically 2 (two) times daily. Apply for 2 weeks. May use on face 08/12/18  Yes Hagler, Arlys JohnBrian, MD  Vitamin D, Ergocalciferol, (DRISDOL) 50000 units CAPS capsule Take 50,000 Units by mouth every 7 (seven) days.   Yes [provider]  predniSONE (DELTASONE) 20 MG tablet Two daily with  food Patient not taking: Reported on 10/02/2018 09/05/18   Elvina Sidle, MD    Family History Family History  Problem Relation Age of Onset  . Cancer Mother   . HIV Mother   . Diabetes Maternal Grandmother   . Diabetes Paternal Grandmother   . Heart failure Father     Social History Social History   Tobacco Use  . Smoking status: Former Smoker    Types: Cigarettes  . Smokeless tobacco: Never Used  Substance Use Topics  . Alcohol use: No  . Drug use: No     Allergies   Codeine and Peanut-containing drug products   Review of Systems Review of Systems  All other systems reviewed and are negative.    Physical Exam Updated Vital Signs BP (!) 142/90 (BP Location: Right Arm)   Pulse 100   Temp 98.8 F (37.1 C) (Oral)   Resp 16   Ht 5\' 4"  (1.626 m)   Wt 124.7 kg   LMP 09/12/2018 (Approximate)   SpO2 99%   BMI 47.20 kg/m   Physical Exam  Constitutional:  She is oriented to person, place, and time. She appears well-developed and well-nourished. No distress.  HENT:  Head: Normocephalic and atraumatic.  Mouth/Throat: Oropharynx is clear and moist.  Bilateral TMs are normal  Eyes: EOM are normal.  Neck: Normal range of motion.  Cardiovascular: Normal rate, regular rhythm and normal heart sounds.  Pulmonary/Chest: Effort normal and breath sounds normal.  Abdominal: Soft. She exhibits no distension. There is no tenderness.  Musculoskeletal: Normal range of motion.  Neurological: She is alert and oriented to person, place, and time.  Skin: Skin is warm and dry.  Psychiatric: She has a normal mood and affect. Judgment normal.  Nursing note and vitals reviewed.    ED Treatments / Results  Labs (all labs ordered are listed, but only abnormal results are displayed) Labs Reviewed - No data to display  EKG None  Radiology Dg Chest 2 View  Result Date: 10/02/2018 CLINICAL DATA:  Productive cough for several days EXAM: CHEST - 2 VIEW COMPARISON:  01/09/2014 FINDINGS: The heart size and mediastinal contours are within normal limits. Both lungs are clear. The visualized skeletal structures show degenerative change of the thoracic spine. IMPRESSION: No active cardiopulmonary disease. Electronically Signed   By: Alcide Clever M.D.   On: 10/02/2018 11:25    Procedures Procedures (including critical care time)  Medications Ordered in ED Medications  albuterol (PROVENTIL HFA;VENTOLIN HFA) 108 (90 Base) MCG/ACT inhaler 2 puff (2 puffs Inhalation Given 10/02/18 1135)     Initial Impression / Assessment and Plan / ED Course  I have reviewed the triage vital signs and the nursing notes.  Pertinent labs & imaging results that were available during my care of the patient were reviewed by me and considered in my medical decision making (see chart for details).     Overall well-appearing.  Suspect viral upper respiratory tract infection.  Chest  x-ray without acute infiltrate.  Primary care follow-up.  Patient understands return the emergency department for new or worsening symptoms.  Albuterol for cough given her upper airway bronchospasm  Final Clinical Impressions(s) / ED Diagnoses   Final diagnoses:  Viral URI with cough    ED Discharge Orders    None       Azalia Bilis, MD 10/02/18 1217

## 2019-01-26 ENCOUNTER — Emergency Department (HOSPITAL_COMMUNITY): Payer: Medicaid Other

## 2019-01-26 ENCOUNTER — Emergency Department (HOSPITAL_COMMUNITY)
Admission: EM | Admit: 2019-01-26 | Discharge: 2019-01-26 | Disposition: A | Discharge status: Home / Self Care | Payer: Medicaid Other | Attending: Emergency Medicine | Admitting: Emergency Medicine

## 2019-01-26 ENCOUNTER — Other Ambulatory Visit: Payer: Self-pay

## 2019-01-26 ENCOUNTER — Encounter (HOSPITAL_COMMUNITY): Payer: Self-pay | Admitting: Emergency Medicine

## 2019-01-26 DIAGNOSIS — Z91010 Allergy to peanuts: Secondary | ICD-10-CM | POA: Insufficient documentation

## 2019-01-26 DIAGNOSIS — Z79899 Other long term (current) drug therapy: Secondary | ICD-10-CM | POA: Insufficient documentation

## 2019-01-26 DIAGNOSIS — Z87891 Personal history of nicotine dependence: Secondary | ICD-10-CM | POA: Insufficient documentation

## 2019-01-26 DIAGNOSIS — J069 Acute upper respiratory infection, unspecified: Secondary | ICD-10-CM | POA: Insufficient documentation

## 2019-01-26 DIAGNOSIS — J4521 Mild intermittent asthma with (acute) exacerbation: Secondary | ICD-10-CM | POA: Insufficient documentation

## 2019-01-26 DIAGNOSIS — R05 Cough: Secondary | ICD-10-CM | POA: Diagnosis present

## 2019-01-26 DIAGNOSIS — Z760 Encounter for issue of repeat prescription: Secondary | ICD-10-CM

## 2019-01-26 DIAGNOSIS — B9789 Other viral agents as the cause of diseases classified elsewhere: Secondary | ICD-10-CM

## 2019-01-26 MED ORDER — PREDNISONE 10 MG PO TABS: 40.0000 mg | ORAL_TABLET | Freq: Every day | ORAL | 0 refills | Status: AC

## 2019-01-26 MED ORDER — ALBUTEROL SULFATE HFA 108 (90 BASE) MCG/ACT IN AERS
1.0000 | INHALATION_SPRAY | Freq: Once | RESPIRATORY_TRACT | Status: AC
  Administered 2019-01-26: 1 via RESPIRATORY_TRACT
  Filled 2019-01-26: qty 6.7

## 2019-01-26 MED ORDER — FLUTICASONE PROPIONATE 50 MCG/ACT NA SUSP: 1.0000 | Freq: Every day | NASAL | 2 refills | Status: DC

## 2019-01-26 MED ORDER — VITAMIN D (ERGOCALCIFEROL) 1.25 MG (50000 UNIT) PO CAPS: 50000.0000 [IU] | ORAL_CAPSULE | ORAL | 0 refills | Status: DC

## 2019-01-26 MED ORDER — PREDNISONE 20 MG PO TABS
60.0000 mg | ORAL_TABLET | Freq: Once | ORAL | Status: AC
  Administered 2019-01-26: 60 mg via ORAL
  Filled 2019-01-26: qty 3

## 2019-01-26 MED ORDER — IPRATROPIUM-ALBUTEROL 0.5-2.5 (3) MG/3ML IN SOLN
3.0000 mL | Freq: Once | RESPIRATORY_TRACT | Status: AC
  Administered 2019-01-26: 3 mL via RESPIRATORY_TRACT
  Filled 2019-01-26: qty 3

## 2019-01-26 MED ORDER — BENZONATATE 100 MG PO CAPS: 200.0000 mg | ORAL_CAPSULE | Freq: Three times a day (TID) | ORAL | 0 refills | Status: DC

## 2019-01-26 MED ORDER — ALBUTEROL SULFATE (2.5 MG/3ML) 0.083% IN NEBU: 2.5000 mg | INHALATION_SOLUTION | Freq: Four times a day (QID) | RESPIRATORY_TRACT | 12 refills | Status: DC | PRN

## 2019-01-26 NOTE — ED Provider Notes (Addendum)
[facility]-EMERGENCY DEPT Provider Note   CSN: [id number] Arrival date & time: 01/26/19  1117    History   Chief Complaint No chief complaint on file.   HPI Melissa Guerra is a 38 y.o. female with a past medical history of asthma, who presents to ED for chief complaint of flulike illness.  She reports cough intermittent productive with mucus, rhinorrhea, body aches, nausea.  Sick contacts including her son with similar symptoms who was diagnosed with influenza and treated with Tamiflu.  Her symptoms began about 1 week ago but worsened in the past few days.  She has tried her nebulizer treatments with some relief.  She has not tried any over-the-counter medications.  No recent travel.  Did not receive influenza vaccine this year.  Denies hemoptysis, abdominal pain.     HPI  Past Medical History:  Diagnosis Date  . Abnormal Pap smear   . Acid reflux   . Asthma   . Obesity     Patient Active Problem List   Diagnosis Date Noted  . 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 FOOT SURGERY    . TONSILLECTOMY    . TONSILLECTOMY AND ADENOIDECTOMY    . TONSILLECTOMY AND ADENOIDECTOMY    . WISDOM TOOTH EXTRACTION       OB History    Gravida  2   Para  2   Term      Preterm      AB      Living        SAB      TAB      Ectopic      Multiple      Live Births               Home Medications    Prior to Admission medications   Medication Sig Start Date End Date Taking? Authorizing Provider  albuterol (PROVENTIL) (2.5 MG/3ML) 0.083% nebulizer solution Take 3 mLs (2.5 mg total) by nebulization every 6 (six) hours as needed for wheezing or shortness of breath. 01/26/19   Normalee Sistare, PA-C  beclomethasone (QVAR) 80 MCG/ACT inhaler Inhale 1 puff into the lungs 2 (two) times daily.    [provider]  benzonatate (TESSALON) 100 MG capsule Take 2 capsules (200 mg  total) by mouth every 8 (eight) hours. 01/26/19   Timberly Yott, PA-C  cetaphil (CETAPHIL) lotion Apply 1 application topically as needed for dry skin.    [provider]  cetirizine (ZYRTEC) 10 MG tablet Take 10 mg by mouth daily.    [provider]  fluticasone (FLONASE) 50 MCG/ACT nasal spray Place 1 spray into both nostrils daily. 01/26/19   Ourania Hamler, PA-C  ibuprofen (ADVIL,MOTRIN) 200 MG tablet Take 400 mg by mouth every 6 (six) hours as needed for headache or mild pain.    [provider]  montelukast (SINGULAIR) 10 MG tablet Take 10 mg by mouth at bedtime.    [provider]  omeprazole (PRILOSEC) 20 MG capsule Take 1 capsule (20 mg total) by mouth 2 (two) times daily before a meal. 01/09/14   Toy Cookey, MD  predniSONE (DELTASONE) 10 MG tablet Take 4 tablets (40 mg total) by mouth daily for 4 days. 01/26/19 01/30/19  Megham Dwyer, PA-C  triamcinolone cream (KENALOG) 0.1 % Apply 1 application topically 2 (two) times daily. Apply for 2 weeks. May use on face 08/12/18   Hagler,  Arlys John, MD  Vitamin D, Ergocalciferol, (DRISDOL) 1.25 MG (50000 UT) CAPS capsule Take 1 capsule (50,000 Units total) by mouth every 7 (seven) days. 01/26/19   Dietrich Pates, PA-C    Family History Family History  Problem Relation Age of Onset  . Cancer Mother   . HIV Mother   . Diabetes Maternal Grandmother   . Diabetes Paternal Grandmother   . Heart failure Father     Social History Social History   Tobacco Use  . Smoking status: Former Smoker    Types: Cigarettes  . Smokeless tobacco: Never Used  Substance Use Topics  . Alcohol use: No  . Drug use: No     Allergies   Codeine and Peanut-containing drug products   Review of Systems Review of Systems  Constitutional: Positive for chills. Negative for fever.  HENT: Positive for congestion and rhinorrhea. Negative for sore throat.   Respiratory: Positive for cough, chest tightness and wheezing.     Gastrointestinal: Positive for nausea.     Physical Exam Updated Vital Signs BP 132/70 (BP Location: Left Arm)   Pulse 98   Temp 99.6 F (37.6 C) (Oral)   Resp 20   Ht  (1.575 m)   Wt 132.5 kg   LMP 01/09/2019 (Exact Date)   SpO2 100%   BMI 53.41 kg/m   Physical Exam Vitals signs and nursing note reviewed.  Constitutional:      General: She is not in acute distress.    Appearance: She is well-developed. She is not diaphoretic.     Comments: Nontoxic-appearing no acute distress.  Speaking complete sentences without difficulty.  HENT:     Head: Normocephalic and atraumatic.     Right Ear: A middle ear effusion is present. Tympanic membrane is erythematous.     Left Ear: A middle ear effusion is present. Tympanic membrane is erythematous.     Nose: Nose normal.     Mouth/Throat:     Pharynx: Oropharynx is clear. Uvula midline.  Eyes:     General: No scleral icterus.    Conjunctiva/sclera: Conjunctivae normal.  Neck:     Musculoskeletal: Normal range of motion.  Cardiovascular:     Rate and Rhythm: Normal rate and regular rhythm.     Heart sounds: Normal heart sounds.  Pulmonary:     Effort: Pulmonary effort is normal. No respiratory distress.     Comments: Diminished breath sounds globally. Skin:    Findings: No rash.  Neurological:     Mental Status: She is alert.      ED Treatments / Results  Labs (all labs ordered are listed, but only abnormal results are displayed) Labs Reviewed - No data to display  EKG None  Radiology Dg Chest 2 View  Result Date: 01/26/2019 CLINICAL DATA:  Cough and shortness of breath EXAM: CHEST - 2 VIEW COMPARISON:  October 02, 2018 FINDINGS: There is no edema or consolidation. The heart size and pulmonary vascularity are normal. No adenopathy. There is degenerative change in the thoracic spine. IMPRESSION: No edema or consolidation. Electronically Signed   By: Bretta Bang III M.D.   On: 01/26/2019 12:17     Procedures Procedures (including critical care time)  Medications Ordered in ED Medications  ipratropium-albuterol (DUONEB) 0.5-2.5 (3) MG/3ML nebulizer solution 3 mL (3 mLs Nebulization Given 01/26/19 1222)  predniSONE (DELTASONE) tablet 60 mg (60 mg Oral Given 01/26/19 1230)  albuterol (PROVENTIL HFA;VENTOLIN HFA) 108 (90 Base) MCG/ACT inhaler 1 puff (1 puff Inhalation Given 01/26/19  1317)     Initial Impression / Assessment and Plan / ED Course  I have reviewed the triage vital signs and the nursing notes.  Pertinent labs & imaging results that were available during my care of the patient were reviewed by me and considered in my medical decision making (see chart for details).        38 year old female with a past medical history of asthma presents to ED with a chief complaint of flulike illness.  She reports cough intermittently productive with mucus, rhinorrhea, body aches, nausea and asthma exacerbation.  Sick contacts including her son with similar symptoms who was diagnosed with influenza over the weekend.  Symptoms have been going on for 1 week but have worsened recently.  On my exam there is globally diminished breath sounds.  Her vital signs are within normal limits.  She is not tachycardic, tachypneic or hypoxic.  No recent use of antipyretics.  Chest x-ray is negative for acute abnormality.  Patient reports significant improvement with 1 DuoNeb treatment given here.  She is requesting refill of her albuterol inhaler and nebulizer treatments.  Will give remainder of prednisone course, antitussives and Flonase to help with your pain.  She is well out of the window for Tamiflu administration so I do not feel that this will be beneficial.  We will have her follow-up with PCP and return to ED for any severe worsening symptoms. Patient asking for refill of her vitamin D 50,000 IU that she takes weekly.  She is recently moved to [facility] and has not yet established care with a primary  care provider.  She has not had her vitamin D level checked in several months but she states she is taking the medication consistently for about a year.  Will prescribe her 2-week refill but have her establish care with a primary care provider here for further refills and testing.  Patient is agreeable to plan.  Patient is hemodynamically stable, in NAD, and able to ambulate in the ED. Evaluation does not show pathology that would require ongoing emergent intervention or inpatient treatment. I explained the diagnosis to the patient. Pain has been managed and has no complaints prior to discharge. Patient is comfortable with above plan and is stable for discharge at this time. All questions were answered prior to disposition. Strict return precautions for returning to the ED were discussed. Encouraged follow up with PCP.    Portions of this note were generated with Scientist, clinical (histocompatibility and immunogenetics). Dictation errors may occur despite best attempts at proofreading.  Final Clinical Impressions(s) / ED Diagnoses   Final diagnoses:  Mild intermittent asthma with exacerbation  Viral URI with cough  Encounter for medication refill    ED Discharge Orders         Ordered    predniSONE (DELTASONE) 10 MG tablet  Daily     01/26/19 1256    benzonatate (TESSALON) 100 MG capsule  Every 8 hours     01/26/19 1256    fluticasone (FLONASE) 50 MCG/ACT nasal spray  Daily     01/26/19 1256    albuterol (PROVENTIL) (2.5 MG/3ML) 0.083% nebulizer solution  Every 6 hours PRN     01/26/19 1256    Vitamin D, Ergocalciferol, (DRISDOL) 1.25 MG (50000 UT) CAPS capsule  Every 7 days     01/26/19 1326              Dietrich Pates, PA-C 01/26/19 1339    Little, Ambrose Finland, MD 01/27/19 [phone]

## 2019-01-26 NOTE — ED Triage Notes (Signed)
Pt reports 2 day hx of productive cough, l/ear pain and chills. Pt using nebulizer treatments at home. Stated that she felt tight in her chest this morning.Stated that child was diagnosed with the flu and started Tamaflu. Deacred breath sounds noted, no wheezing.

## 2019-01-26 NOTE — Discharge Instructions (Addendum)
Take the steroids beginning tomorrow. Take the other medications as needed to help with your symptoms. Return to the ED for worsening symptoms, chest pain, shortness of breath, vomiting or coughing up blood.

## 2019-02-06 ENCOUNTER — Other Ambulatory Visit: Payer: Self-pay

## 2019-02-06 ENCOUNTER — Emergency Department (HOSPITAL_COMMUNITY)
Admission: EM | Admit: 2019-02-06 | Discharge: 2019-02-06 | Disposition: A | Discharge status: Home / Self Care | Payer: Medicaid Other | Attending: Emergency Medicine | Admitting: Emergency Medicine

## 2019-02-06 ENCOUNTER — Encounter (HOSPITAL_COMMUNITY): Payer: Self-pay

## 2019-02-06 DIAGNOSIS — R6889 Other general symptoms and signs: Secondary | ICD-10-CM

## 2019-02-06 DIAGNOSIS — J45909 Unspecified asthma, uncomplicated: Secondary | ICD-10-CM | POA: Insufficient documentation

## 2019-02-06 DIAGNOSIS — J111 Influenza due to unidentified influenza virus with other respiratory manifestations: Secondary | ICD-10-CM | POA: Diagnosis not present

## 2019-02-06 DIAGNOSIS — J019 Acute sinusitis, unspecified: Secondary | ICD-10-CM | POA: Diagnosis not present

## 2019-02-06 DIAGNOSIS — R51 Headache: Secondary | ICD-10-CM | POA: Diagnosis present

## 2019-02-06 DIAGNOSIS — Z87891 Personal history of nicotine dependence: Secondary | ICD-10-CM | POA: Diagnosis not present

## 2019-02-06 DIAGNOSIS — Z79899 Other long term (current) drug therapy: Secondary | ICD-10-CM | POA: Insufficient documentation

## 2019-02-06 DIAGNOSIS — J069 Acute upper respiratory infection, unspecified: Secondary | ICD-10-CM | POA: Insufficient documentation

## 2019-02-06 MED ORDER — BENZONATATE 200 MG PO CAPS: 200.0000 mg | ORAL_CAPSULE | Freq: Three times a day (TID) | ORAL | 0 refills | Status: DC | PRN

## 2019-02-06 MED ORDER — AMOXICILLIN-POT CLAVULANATE 875-125 MG PO TABS: 1.0000 | ORAL_TABLET | Freq: Two times a day (BID) | ORAL | 0 refills | Status: DC

## 2019-02-06 NOTE — ED Triage Notes (Signed)
Pt states dry cough and headache x 1 week. Pt states son was dx with the flu recently. Pt states she was dx with URI 1 week ago. Pt states that they gave her an rx, which helped, but it has now run out.

## 2019-02-06 NOTE — ED Provider Notes (Signed)
[facility]-EMERGENCY DEPT Provider Note   CSN: [id number] Arrival date & time: 02/06/19  1520    History   Chief Complaint Chief Complaint  Patient presents with  . Generalized Body Aches    HPI    Melissa Guerra is a 38 y.o. female with a PMHx of asthma, obesity, and GERD, who presents to the ED with complaints of worsening of her flu like symptoms x2-3 days.  Patient states that she initially started having flulike symptoms about 2 to 2-1/2 weeks ago, she was seen in the ED on 01/26/19 for this, her son had recently tested positive for the flu and was treated with tamiflu. At that ED visit, she had a CXR that was negative, she was given a duoneb, 60mg  prednisone, and albuterol inhaler, then discharged with tessalon perles, albuterol, prednisone 40mg  QD x4 days, and flonase rx's.  She states that she took the medications and seemed to improve however over the last 2 to 3 days her symptoms started getting worse again.  Her symptoms include mild constant body aches, sinus pain, rhinorrhea, left ear pain, mild dry cough, and some chills.  The medication she was given at her last ED visit seemed to help.  No known aggravating factors.  No other known sick contacts aside from her son who tested positive for the flu.  No recent travel.  She denies any known fevers, wheezing, chest pain, shortness of breath, sore throat, ear drainage, abdominal pain, nausea, vomiting, diarrhea, constipation, dysuria, hematuria, numbness, tingling, focal weakness, or any other complaints at this time.  The history is provided by the patient and medical records. No language interpreter was used.    Past Medical History:  Diagnosis Date  . Abnormal Pap smear   . Acid reflux   . Asthma   . Obesity     Patient Active Problem List   Diagnosis Date Noted  . 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 FOOT SURGERY    .  TONSILLECTOMY    . TONSILLECTOMY AND ADENOIDECTOMY    . TONSILLECTOMY AND ADENOIDECTOMY    . WISDOM TOOTH EXTRACTION       OB History    Gravida  2   Para  2   Term      Preterm      AB      Living        SAB      TAB      Ectopic      Multiple      Live Births               Home Medications    Prior to Admission medications   Medication Sig Start Date End Date Taking? Authorizing Provider  albuterol (PROVENTIL) (2.5 MG/3ML) 0.083% nebulizer solution Take 3 mLs (2.5 mg total) by nebulization every 6 (six) hours as needed for wheezing or shortness of breath. 01/26/19   Khatri, Hina, PA-C  beclomethasone (QVAR) 80 MCG/ACT inhaler Inhale 1 puff into the lungs 2 (two) times daily.    [provider]  benzonatate (TESSALON) 100 MG capsule Take 2 capsules (200 mg total) by mouth every 8 (eight) hours. 01/26/19   Khatri, Hina, PA-C  cetaphil (CETAPHIL) lotion Apply 1 application topically as needed for dry skin.    [provider]  cetirizine (ZYRTEC) 10 MG tablet Take 10 mg by mouth daily.    [provider]  fluticasone (FLONASE) 50 MCG/ACT nasal spray Place 1 spray into both nostrils daily. 01/26/19   Khatri, Hina, PA-C  ibuprofen (ADVIL,MOTRIN) 200 MG tablet Take 400 mg by mouth every 6 (six) hours as needed for headache or mild pain.    [provider]  montelukast (SINGULAIR) 10 MG tablet Take 10 mg by mouth at bedtime.    [provider]  omeprazole (PRILOSEC) 20 MG capsule Take 1 capsule (20 mg total) by mouth 2 (two) times daily before a meal. 01/09/14   Toy Cookey, MD  triamcinolone cream (KENALOG) 0.1 % Apply 1 application topically 2 (two) times daily. Apply for 2 weeks. May use on face 08/12/18   Mardella Layman, MD  Vitamin D, Ergocalciferol, (DRISDOL) 1.25 MG (50000 UT) CAPS capsule Take 1 capsule (50,000 Units total) by mouth every 7 (seven) days. 01/26/19   Dietrich Pates, PA-C    Family History Family History   Problem Relation Age of Onset  . Cancer Mother   . HIV Mother   . Diabetes Maternal Grandmother   . Diabetes Paternal Grandmother   . Heart failure Father     Social History Social History   Tobacco Use  . Smoking status: Former Smoker    Types: Cigarettes  . Smokeless tobacco: Never Used  Substance Use Topics  . Alcohol use: No  . Drug use: No     Allergies   Codeine and Peanut-containing drug products   Review of Systems Review of Systems  Constitutional: Positive for chills. Negative for fever.  HENT: Positive for ear pain, rhinorrhea and sinus pain. Negative for ear discharge and sore throat.   Respiratory: Positive for cough (dry). Negative for shortness of breath and wheezing.   Cardiovascular: Negative for chest pain.  Gastrointestinal: Negative for abdominal pain, constipation, diarrhea, nausea and vomiting.  Genitourinary: Negative for dysuria and hematuria.  Musculoskeletal: Positive for myalgias. Negative for arthralgias.  Skin: Negative for color change.  Allergic/Immunologic: Negative for immunocompromised state.  Neurological: Negative for weakness and numbness.  Psychiatric/Behavioral: Negative for confusion.   All other systems reviewed and are negative for acute change except as noted in the HPI.    Physical Exam Updated Vital Signs BP (!) 160/82 (BP Location: Right Arm)   Pulse (!) 107   Temp 98.5 F (36.9 C) (Oral)   Resp 18   Ht  (1.575 m)   Wt 132 kg   LMP 01/09/2019 (Exact Date)   SpO2 99%   BMI 53.23 kg/m   Physical Exam Vitals signs and nursing note reviewed.  Constitutional:      General: She is not in acute distress.    Appearance: Normal appearance. She is well-developed. She is not toxic-appearing.     Comments: Afebrile, nontoxic, NAD  HENT:     Head: Normocephalic and atraumatic.     Right Ear: Hearing, ear canal and external ear normal. A middle ear effusion (mild serous effusion) is present. Tympanic membrane is not  injected, erythematous or bulging.     Left Ear: Hearing, ear canal and external ear normal. A middle ear effusion (moderate serous effusion) is present. Tympanic membrane is bulging. Tympanic membrane is not injected or erythematous.     Ears:     Comments: Right ear with mild serous effusion but no injection or erythema to the TM, no bulging.  Left ear with moderate serous effusion and bulging of the TM, no erythema or injection.    Nose: Congestion and rhinorrhea present. Rhinorrhea is  purulent.     Left Sinus: Maxillary sinus tenderness and frontal sinus tenderness present.     Comments: Diffuse left sinus tenderness, moderate nasal congestion with green rhinorrhea particularly on the left side.    Mouth/Throat:     Mouth: Mucous membranes are moist.     Pharynx: Oropharynx is clear. Uvula midline. No pharyngeal swelling, oropharyngeal exudate, posterior oropharyngeal erythema or uvula swelling.     Tonsils: No tonsillar exudate or tonsillar abscesses.  Eyes:     General:        Right eye: No discharge.        Left eye: No discharge.     Conjunctiva/sclera: Conjunctivae normal.  Neck:     Musculoskeletal: Normal range of motion and neck supple.  Cardiovascular:     Rate and Rhythm: Normal rate and regular rhythm.     Pulses: Normal pulses.     Heart sounds: Normal heart sounds, S1 normal and S2 normal. No murmur. No friction rub. No gallop.      Comments: No tachycardia on exam Pulmonary:     Effort: Pulmonary effort is normal. No respiratory distress.     Breath sounds: Normal breath sounds. No decreased breath sounds, wheezing, rhonchi or rales.     Comments: CTAB in all lung fields, no w/r/r, no hypoxia or increased WOB, speaking in full sentences, SpO2 99% on RA  Abdominal:     General: Bowel sounds are normal. There is no distension.     Palpations: Abdomen is soft. Abdomen is not rigid.     Tenderness: There is no abdominal tenderness. There is no right CVA tenderness, left  CVA tenderness, guarding or rebound. Negative signs include Murphy's sign and McBurney's sign.  Musculoskeletal: Normal range of motion.  Skin:    General: Skin is warm and dry.     Findings: No rash.  Neurological:     Mental Status: She is alert and oriented to person, place, and time.     Sensory: Sensation is intact. No sensory deficit.     Motor: Motor function is intact.  Psychiatric:        Mood and Affect: Mood and affect normal.        Behavior: Behavior normal.      ED Treatments / Results  Labs (all labs ordered are listed, but only abnormal results are displayed) Labs Reviewed - No data to display  EKG None  Radiology No results found.   Dg Chest 2 View  Result Date: 01/26/2019 CLINICAL DATA:  Cough and shortness of breath EXAM: CHEST - 2 VIEW COMPARISON:  October 02, 2018 FINDINGS: There is no edema or consolidation. The heart size and pulmonary vascularity are normal. No adenopathy. There is degenerative change in the thoracic spine. IMPRESSION: No edema or consolidation. Electronically Signed   By: Bretta Bang III M.D.   On: 01/26/2019 12:17      Procedures Procedures (including critical care time)  Medications Ordered in ED Medications - No data to display   Initial Impression / Assessment and Plan / ED Course  I have reviewed the triage vital signs and the nursing notes.  Pertinent labs & imaging results that were available during my care of the patient were reviewed by me and considered in my medical decision making (see chart for details).        38 y.o. female here with flulike symptoms that initially began 2 weeks ago, was getting better, until about 2 to 3 days ago when she  started getting worse.  She mostly complains of upper respiratory symptoms including sinus pain and rhinorrhea, left ear pain.  She also has a dry cough but this is improving.  Afebrile.  On exam, nontoxic-appearing, afebrile, no tachycardia during exam, clear lung  exam, moderate left sinus tenderness with nasal congestion and green rhinorrhea, left ear with serous effusion with bulging but no TM erythema or injection, right ear with mild serous effusion but without bulging or erythema.  Suspect superimposed bacterial sinusitis after a likely flu/viral illness, doubt need for repeat chest x-ray as this was done at her last ED visit on 01/26/2019.  Doubt need for further emergent work-up.  Doubt need for COVID 19 testing.  Will send home with Augmentin and Tessalon, advised other OTC remedies for symptomatic care, and follow-up with PCP in 1 week. I explained the diagnosis and have given explicit precautions to return to the ER including for any other new or worsening symptoms. The patient understands and accepts the medical plan as it's been dictated and I have answered their questions. Discharge instructions concerning home care and prescriptions have been given. The patient is STABLE and is discharged to home in good condition.    Final Clinical Impressions(s) / ED Diagnoses   Final diagnoses:  Acute sinusitis, recurrence not specified, unspecified location  Flu-like symptoms  Upper respiratory tract infection, unspecified type    ED Discharge Orders         Ordered    benzonatate (TESSALON) 200 MG capsule  3 times daily PRN     02/06/19 1548    amoxicillin-clavulanate (AUGMENTIN) 875-125 MG tablet  2 times daily     02/06/19 [address], [place], [place] 02/06/19 1556    Vanetta Mulders, MD 02/10/19 2051

## 2019-02-06 NOTE — Discharge Instructions (Signed)
Continue to stay well-hydrated. Gargle warm salt water and spit it out and use chloraseptic spray as needed for sore throat. Continue to alternate between Tylenol and Ibuprofen for pain or fever. Use Mucinex/Robitussin/etc for cough suppression/expectoration of mucus. Use over the counter flonase and the netipot to help with nasal congestion. May consider over-the-counter Benadryl or other antihistamine like Claritin/Zyrtec/etc to decrease secretions and for help with your symptoms. Use home inhaler as directed, as needed for cough/chest congestion/wheezing/shortness of breath. Use Tesslon pearls for cough suppression. Take antibiotic as directed until completed. Follow up with your primary care doctor in 5-7 days for recheck of ongoing symptoms. Return to emergency department for emergent changing or worsening of symptoms.

## 2019-05-06 ENCOUNTER — Emergency Department (HOSPITAL_COMMUNITY): Payer: Medicaid Other

## 2019-05-06 ENCOUNTER — Emergency Department (HOSPITAL_COMMUNITY)
Admission: EM | Admit: 2019-05-06 | Discharge: 2019-05-07 | Disposition: A | Discharge status: Home / Self Care | Payer: Medicaid Other | Attending: Emergency Medicine | Admitting: Emergency Medicine

## 2019-05-06 ENCOUNTER — Other Ambulatory Visit: Payer: Self-pay

## 2019-05-06 ENCOUNTER — Encounter (HOSPITAL_COMMUNITY): Payer: Self-pay | Admitting: Emergency Medicine

## 2019-05-06 DIAGNOSIS — R05 Cough: Secondary | ICD-10-CM | POA: Diagnosis not present

## 2019-05-06 DIAGNOSIS — Z91010 Allergy to peanuts: Secondary | ICD-10-CM | POA: Insufficient documentation

## 2019-05-06 DIAGNOSIS — Z20828 Contact with and (suspected) exposure to other viral communicable diseases: Secondary | ICD-10-CM | POA: Insufficient documentation

## 2019-05-06 DIAGNOSIS — J069 Acute upper respiratory infection, unspecified: Secondary | ICD-10-CM | POA: Diagnosis not present

## 2019-05-06 DIAGNOSIS — B9789 Other viral agents as the cause of diseases classified elsewhere: Secondary | ICD-10-CM | POA: Diagnosis not present

## 2019-05-06 DIAGNOSIS — Z87891 Personal history of nicotine dependence: Secondary | ICD-10-CM | POA: Insufficient documentation

## 2019-05-06 DIAGNOSIS — Z20822 Contact with and (suspected) exposure to covid-19: Secondary | ICD-10-CM

## 2019-05-06 DIAGNOSIS — M791 Myalgia, unspecified site: Secondary | ICD-10-CM | POA: Insufficient documentation

## 2019-05-06 DIAGNOSIS — J45909 Unspecified asthma, uncomplicated: Secondary | ICD-10-CM | POA: Diagnosis not present

## 2019-05-06 DIAGNOSIS — R109 Unspecified abdominal pain: Secondary | ICD-10-CM | POA: Insufficient documentation

## 2019-05-06 DIAGNOSIS — Z79899 Other long term (current) drug therapy: Secondary | ICD-10-CM | POA: Insufficient documentation

## 2019-05-06 DIAGNOSIS — R51 Headache: Secondary | ICD-10-CM | POA: Diagnosis present

## 2019-05-06 DIAGNOSIS — R509 Fever, unspecified: Secondary | ICD-10-CM | POA: Insufficient documentation

## 2019-05-06 MED ORDER — SODIUM CHLORIDE 0.9% FLUSH: 3.0000 mL | Freq: Once | INTRAVENOUS | Status: DC

## 2019-05-06 NOTE — ED Triage Notes (Signed)
Patient here from home with complaints of chills, abd pain, headache, and cough x3 days. Reports "I just feel like crap". 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

## 2019-05-07 LAB — CBC
HCT: 35.1 % — ABNORMAL LOW (ref 36.0–46.0)
Hemoglobin: 10.4 g/dL — ABNORMAL LOW (ref 12.0–15.0)
MCH: 25.3 pg — ABNORMAL LOW (ref 26.0–34.0)
MCHC: 29.6 g/dL — ABNORMAL LOW (ref 30.0–36.0)
MCV: 85.4 fL (ref 80.0–100.0)
Platelets: 322 10*3/uL (ref 150–400)
RBC: 4.11 MIL/uL (ref 3.87–5.11)
RDW: 15.3 % (ref 11.5–15.5)
WBC: 6.7 10*3/uL (ref 4.0–10.5)
nRBC: 0 % (ref 0.0–0.2)

## 2019-05-07 LAB — COMPREHENSIVE METABOLIC PANEL
ALT: 14 U/L (ref 0–44)
AST: 17 U/L (ref 15–41)
Albumin: 3.8 g/dL (ref 3.5–5.0)
Alkaline Phosphatase: 79 U/L (ref 38–126)
Anion gap: 8 (ref 5–15)
BUN: 11 mg/dL (ref 6–20)
CO2: 24 mmol/L (ref 22–32)
Calcium: 8.7 mg/dL — ABNORMAL LOW (ref 8.9–10.3)
Chloride: 107 mmol/L (ref 98–111)
Creatinine, Ser: 0.63 mg/dL (ref 0.44–1.00)
GFR calc Af Amer: >60 mL/min (ref >60)
GFR calc non Af Amer: >60 mL/min (ref >60)
Glucose, Bld: 94 mg/dL (ref 70–99)
Potassium: 3.8 mmol/L (ref 3.5–5.1)
Sodium: 139 mmol/L (ref 135–145)
Total Bilirubin: 0.5 mg/dL (ref 0.3–1.2)
Total Protein: 7.2 g/dL (ref 6.5–8.1)

## 2019-05-07 LAB — URINALYSIS, ROUTINE W REFLEX MICROSCOPIC
Bilirubin Urine: NEGATIVE
Glucose, UA: NEGATIVE mg/dL
Hgb urine dipstick: NEGATIVE
Ketones, ur: NEGATIVE mg/dL
Leukocytes,Ua: NEGATIVE
Nitrite: NEGATIVE
Protein, ur: NEGATIVE mg/dL
Specific Gravity, Urine: 1.026 (ref 1.005–1.030)
pH: 6 (ref 5.0–8.0)

## 2019-05-07 LAB — I-STAT BETA HCG BLOOD, ED (NOT ORDERABLE): I-stat hCG, quantitative: <5 m[IU]/mL (ref <5)

## 2019-05-07 LAB — LIPASE, BLOOD: Lipase: 27 U/L (ref 11–51)

## 2019-05-07 MED ORDER — SODIUM CHLORIDE 0.9 % IV BOLUS
1000.0000 mL | Freq: Once | INTRAVENOUS | Status: AC
  Administered 2019-05-07: 1000 mL via INTRAVENOUS

## 2019-05-07 MED ORDER — KETOROLAC TROMETHAMINE 30 MG/ML IJ SOLN
15.0000 mg | Freq: Once | INTRAMUSCULAR | Status: AC
  Administered 2019-05-07: 15 mg via INTRAVENOUS
  Filled 2019-05-07: qty 1

## 2019-05-07 NOTE — ED Provider Notes (Signed)
[facility] DEPT Provider Note   CSN: [id number] Arrival date & time: 05/06/19  2313    History   Chief Complaint Chief Complaint  Patient presents with  . Headache  . Cough  . Chills    HPI Melissa Guerra is a 38 y.o. female.     38 yo F with a chief complaints of cough congestion fever chills myalgias headache abdominal pain and nausea.  Going on for the past couple days.  No known sick contacts.  No recent travel.  Centralized abdominal pain.  Comes and goes.  The history is provided by the patient.  Headache Associated symptoms: abdominal pain, congestion, cough, fever and myalgias   Associated symptoms: no dizziness, no nausea and no vomiting   Cough Associated symptoms: chills, fever and myalgias   Associated symptoms: no chest pain, no headaches, no rhinorrhea, no shortness of breath and no wheezing   Illness Severity:  Moderate Onset quality:  Gradual Duration:  2 days Timing:  Constant Progression:  Worsening Chronicity:  New Associated symptoms: abdominal pain, congestion, cough, fever and myalgias   Associated symptoms: no chest pain, no headaches, no nausea, no rhinorrhea, no shortness of breath, no vomiting and no wheezing     Past Medical History:  Diagnosis Date  . Abnormal Pap smear   . Acid reflux   . Asthma   . Obesity     Patient Active Problem List   Diagnosis Date Noted  . 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 FOOT SURGERY    . TONSILLECTOMY    . TONSILLECTOMY AND ADENOIDECTOMY    . TONSILLECTOMY AND ADENOIDECTOMY    . WISDOM TOOTH EXTRACTION       OB History    Gravida  2   Para  2   Term      Preterm      AB      Living        SAB      TAB      Ectopic      Multiple      Live Births               Home Medications    Prior to Admission medications   Medication Sig Start Date End Date Taking?  Authorizing Provider  cetaphil (CETAPHIL) lotion Apply 1 application topically as needed for dry skin.   Yes [provider]  cetirizine (ZYRTEC) 10 MG tablet Take 10 mg by mouth daily.   Yes [provider]  ibuprofen (ADVIL,MOTRIN) 200 MG tablet Take 400 mg by mouth every 6 (six) hours as needed for headache or mild pain.   Yes [provider]  montelukast (SINGULAIR) 10 MG tablet Take 10 mg by mouth at bedtime.   Yes [provider]  Vitamin D, Ergocalciferol, (DRISDOL) 1.25 MG (50000 UT) CAPS capsule Take 1 capsule (50,000 Units total) by mouth every 7 (seven) days. 01/26/19  Yes Khatri, Hina, PA-C  albuterol (PROVENTIL) (2.5 MG/3ML) 0.083% nebulizer solution Take 3 mLs (2.5 mg total) by nebulization every 6 (six) hours as needed for wheezing or shortness of breath. Patient not taking: Reported on 05/07/2019 01/26/19   Delia Heady, PA-C  amoxicillin-clavulanate (AUGMENTIN) 875-125 MG tablet Take 1 tablet by mouth 2 (two) times daily. One po bid x 7 days Patient not taking: Reported on 05/07/2019 02/06/19   Street, [place], PA-C  benzonatate (TESSALON) 100 MG  capsule Take 2 capsules (200 mg total) by mouth every 8 (eight) hours. Patient not taking: Reported on 05/07/2019 01/26/19   Dietrich PatesKhatri, Hina, PA-C  benzonatate (TESSALON) 200 MG capsule Take 1 capsule (200 mg total) by mouth 3 (three) times daily as needed for cough. Patient not taking: Reported on 05/07/2019 02/06/19   Street, KathleenMercedes, PA-C  fluticasone [facility](FLONASE) 50 MCG/ACT nasal spray Place 1 spray into both nostrils daily. Patient not taking: Reported on 05/07/2019 01/26/19   Dietrich PatesKhatri, Hina, PA-C  omeprazole (PRILOSEC) 20 MG capsule Take 1 capsule (20 mg total) by mouth 2 (two) times daily before a meal. Patient not taking: Reported on 05/07/2019 01/09/14   Toy Cookeyocherty, Megan, MD  triamcinolone cream (KENALOG) 0.1 % Apply 1 application topically 2 (two) times daily. Apply for 2 weeks. May use on face Patient not  taking: Reported on 05/07/2019 08/12/18   Mardella LaymanHagler, Brian, MD    Family History Family History  Problem Relation Age of Onset  . Cancer Mother   . HIV Mother   . Diabetes Maternal Grandmother   . Diabetes Paternal Grandmother   . Heart failure Father     Social History Social History   Tobacco Use  . Smoking status: Former Smoker    Types: Cigarettes  . Smokeless tobacco: Never Used  Substance Use Topics  . Alcohol use: No  . Drug use: No     Allergies   Codeine and Peanut-containing drug products   Review of Systems Review of Systems  Constitutional: Positive for chills and fever.  HENT: Positive for congestion. Negative for rhinorrhea.   Eyes: Negative for redness and visual disturbance.  Respiratory: Positive for cough. Negative for shortness of breath and wheezing.   Cardiovascular: Negative for chest pain and palpitations.  Gastrointestinal: Positive for abdominal pain. Negative for nausea and vomiting.  Genitourinary: Negative for dysuria and urgency.  Musculoskeletal: Positive for myalgias. Negative for arthralgias.  Skin: Negative for pallor and wound.  Neurological: Negative for dizziness and headaches.     Physical Exam Updated Vital Signs BP (!) 154/80 (BP Location: Left Arm)   Pulse 76   Temp 98.8 F (37.1 C) (Oral)   Resp 20   Ht 5\' 4"  (1.626 m)   Wt 122.5 kg   LMP 04/28/2019   SpO2 100%   BMI 46.35 kg/m   Physical Exam Vitals signs and nursing note reviewed.  Constitutional:      General: She is not in acute distress.    Appearance: She is well-developed. She is not diaphoretic.  HENT:     Head: Normocephalic and atraumatic.     Comments: Swollen turbinates, posterior nasal drip, no noted sinus ttp, tm normal bilaterally.   Eyes:     Pupils: Pupils are equal, round, and reactive to light.  Neck:     Musculoskeletal: Normal range of motion and neck supple. No neck rigidity.     Meningeal: Brudzinski's sign and Kernig's sign absent.   Cardiovascular:     Rate and Rhythm: Normal rate and regular rhythm.     Heart sounds: No murmur. No friction rub. No gallop.   Pulmonary:     Effort: Pulmonary effort is normal.     Breath sounds: No wheezing or rales.  Abdominal:     General: There is no distension.     Palpations: Abdomen is soft.     Tenderness: There is no abdominal tenderness.     Comments: Benign abdominal exam  Musculoskeletal:  General: No tenderness.  Skin:    General: Skin is warm and dry.  Neurological:     Mental Status: She is alert and oriented to person, place, and time.  Psychiatric:        Behavior: Behavior normal.      ED Treatments / Results  Labs (all labs ordered are listed, but only abnormal results are displayed) Labs Reviewed  COMPREHENSIVE METABOLIC PANEL - Abnormal; Notable for the following components:      Result Value   Calcium 8.7 (*)    All other components within normal limits  CBC - Abnormal; Notable for the following components:   Hemoglobin 10.4 (*)    HCT 35.1 (*)    MCH 25.3 (*)    MCHC 29.6 (*)    All other components within normal limits  NOVEL CORONAVIRUS, NAA (HOSPITAL ORDER, SEND-OUT TO REF LAB)  LIPASE, BLOOD  URINALYSIS, ROUTINE W REFLEX MICROSCOPIC  I-STAT BETA HCG BLOOD, ED ([facility], [facility], [facility] ONLY)  I-STAT BETA HCG BLOOD, ED (NOT ORDERABLE)    EKG None  Radiology Dg Chest 2 View  Result Date: 05/06/2019 CLINICAL DATA:  38 y/o  F; cough, fever, and body aches. EXAM: CHEST - 2 VIEW COMPARISON:  01/26/2019 chest radiograph FINDINGS: Stable heart size and mediastinal contours are within normal limits. Both lungs are clear. The visualized skeletal structures are unremarkable. IMPRESSION: No active cardiopulmonary disease. Electronically Signed   By: Mitzi HansenLance  Furusawa-Stratton M.D.   On: 05/06/2019 23:57    Procedures Procedures (including critical care time)  Medications Ordered in ED Medications  sodium chloride flush (NS) 0.9 % injection 3 mL (0 mLs  Intravenous Hold 05/06/19 2353)  sodium chloride 0.9 % bolus 1,000 mL (1,000 mLs Intravenous New Bag/Given 05/07/19 0032)  ketorolac (TORADOL) 30 MG/ML injection 15 mg (15 mg Intravenous Given 05/07/19 0033)     Initial Impression / Assessment and Plan / ED Course  I have reviewed the triage vital signs and the nursing notes.  Pertinent labs & imaging results that were available during my care of the patient were reviewed by me and considered in my medical decision making (see chart for details).        38 yo F with a chief complaints of URI-like symptoms.  Going on for the past couple days.  She had some abdominal pain though no pain on my exam.  Will obtain a laboratory evaluation give a bolus of IV fluids Toradol.  She has a headache but no meningeal signs.  Chest x-ray viewed by me without focal infiltrate or pneumothorax.  This being in the current coronavirus pandemic will send off an outpatient test.  Work-up here is unremarkable.  No leukocytosis LFTs and lipase are normal patient is not pregnant.  Feeling better after fluids and Toradol.  PCP follow-up.  1:20 AM:  I have discussed the diagnosis/risks/treatment options with the patient and believe the pt to be eligible for discharge home to follow-up with PCP. We also discussed returning to the ED immediately if new or worsening sx occur. We discussed the sx which are most concerning (e.g., sudden worsening shortness of breath, fever, inability to tolerate by mouth) that necessitate immediate return. Medications administered to the patient during their visit and any new prescriptions provided to the patient are listed below.  Medications given during this visit Medications  sodium chloride flush (NS) 0.9 % injection 3 mL (0 mLs Intravenous Hold 05/06/19 2353)  sodium chloride 0.9 % bolus 1,000 mL (1,000 mLs Intravenous  New Bag/Given 05/07/19 0032)  ketorolac (TORADOL) 30 MG/ML injection 15 mg (15 mg Intravenous Given 05/07/19 0033)      The patient appears reasonably screen and/or stabilized for discharge and I doubt any other medical condition or other [facility] requiring further screening, evaluation, or treatment in the ED at this time prior to discharge.    Final Clinical Impressions(s) / ED Diagnoses   Final diagnoses:  Viral upper respiratory tract infection  Suspected Covid-19 Virus Infection    ED Discharge Orders    None       Melene PlanFloyd, Dontario Evetts, DO 05/07/19 0120

## 2019-05-07 NOTE — Discharge Instructions (Signed)
Take tylenol 2 pills 4 times a day and motrin 4 pills 3 times a day.  Drink plenty of fluids.  Return for worsening shortness of breath, headache, confusion. Follow up with your family doctor.       Person Under Monitoring Name: Melissa Guerra  Location: [place] [address]   Infection Prevention Recommendations for Individuals Confirmed to have, or Being Evaluated for, 2019 Novel Coronavirus (COVID-19) Infection Who Receive Care at Home  Individuals who are confirmed to have, or are being evaluated for, COVID-19 should follow the prevention steps below until a healthcare provider or local or state health department says they can return to normal activities.  Stay home except to get medical care You should restrict activities outside your home, except for getting medical care. Do not go to work, school, or public areas, and do not use public transportation or taxis.  Call ahead before visiting your doctor Before your medical appointment, call the healthcare provider and tell them that you have, or are being evaluated for, COVID-19 infection. This will help the healthcare providers office take steps to keep other people from getting infected. Ask your healthcare provider to call the local or state health department.  Monitor your symptoms Seek prompt medical attention if your illness is worsening (e.g., difficulty breathing). Before going to your medical appointment, call the healthcare provider and tell them that you have, or are being evaluated for, COVID-19 infection. Ask your healthcare provider to call the local or state health department.  Wear a facemask You should wear a facemask that covers your nose and mouth when you are in the same room with other people and when you visit a healthcare provider. People who live with or visit you should also wear a facemask while they are in the same room with you.  Separate yourself from other people in your  home As much as possible, you should stay in a different room from other people in your home. Also, you should use a separate bathroom, if available.  Avoid sharing household items You should not share dishes, drinking glasses, cups, eating utensils, towels, bedding, or other items with other people in your home. After using these items, you should wash them thoroughly with soap and water.  Cover your coughs and sneezes Cover your mouth and nose with a tissue when you cough or sneeze, or you can cough or sneeze into your sleeve. Throw used tissues in a lined trash can, and immediately wash your hands with soap and water for at least 20 seconds or use an alcohol-based hand rub.  Wash your [facility] your hands often and thoroughly with soap and water for at least 20 seconds. You can use an alcohol-based hand sanitizer if soap and water are not available and if your hands are not visibly dirty. Avoid touching your eyes, nose, and mouth with unwashed hands.   Prevention Steps for Caregivers and Household Members of Individuals Confirmed to have, or Being Evaluated for, COVID-19 Infection Being Cared for in the Home  If you live with, or provide care at home for, a person confirmed to have, or being evaluated for, COVID-19 infection please follow these guidelines to prevent infection:  Follow healthcare providers instructions Make sure that you understand and can help the patient follow any healthcare provider instructions for all care.  Provide for the patients basic needs You should help the patient with basic needs in the home and provide support for getting  groceries, prescriptions, and other personal needs.  Monitor the patients symptoms If they are getting sicker, call his or her medical provider and tell them that the patient has, or is being evaluated for, COVID-19 infection. This will help the healthcare providers office take steps to keep other people from getting  infected. Ask the healthcare provider to call the local or state health department.  Limit the number of people who have contact with the patient If possible, have only one caregiver for the patient. Other household members should stay in another home or place of residence. If this is not possible, they should stay in another room, or be separated from the patient as much as possible. Use a separate bathroom, if available. Restrict visitors who do not have an essential need to be in the home.  Keep older adults, very young children, and other sick people away from the patient Keep older adults, very young children, and those who have compromised immune systems or chronic health conditions away from the patient. This includes people with chronic heart, lung, or kidney conditions, diabetes, and cancer.  Ensure good ventilation Make sure that shared spaces in the home have good air flow, such as from an air conditioner or an opened window, weather permitting.  Wash your hands often Wash your hands often and thoroughly with soap and water for at least 20 seconds. You can use an alcohol based hand sanitizer if soap and water are not available and if your hands are not visibly dirty. Avoid touching your eyes, nose, and mouth with unwashed hands. Use disposable paper towels to dry your hands. If not available, use dedicated cloth towels and replace them when they become wet.  Wear a facemask and gloves Wear a disposable facemask at all times in the room and gloves when you touch or have contact with the patients blood, body fluids, and/or secretions or excretions, such as sweat, saliva, sputum, nasal mucus, vomit, urine, or feces.  Ensure the mask fits over your nose and mouth tightly, and do not touch it during use. Throw out disposable facemasks and gloves after using them. Do not reuse. Wash your hands immediately after removing your facemask and gloves. If your personal clothing becomes  contaminated, carefully remove clothing and launder. Wash your hands after handling contaminated clothing. Place all used disposable facemasks, gloves, and other waste in a lined container before disposing them with other household waste. Remove gloves and wash your hands immediately after handling these items.  Do not share dishes, glasses, or other household items with the patient Avoid sharing household items. You should not share dishes, drinking glasses, cups, eating utensils, towels, bedding, or other items with a patient who is confirmed to have, or being evaluated for, COVID-19 infection. After the person uses these items, you should wash them thoroughly with soap and water.  Wash laundry thoroughly Immediately remove and wash clothes or bedding that have blood, body fluids, and/or secretions or excretions, such as sweat, saliva, sputum, nasal mucus, vomit, urine, or feces, on them. Wear gloves when handling laundry from the patient. Read and follow directions on labels of laundry or clothing items and detergent. In general, wash and dry with the warmest temperatures recommended on the label.  Clean all areas the individual has used often Clean all touchable surfaces, such as counters, tabletops, doorknobs, bathroom fixtures, toilets, phones, keyboards, tablets, and bedside tables, every day. Also, clean any surfaces that may have blood, body fluids, and/or secretions or excretions on them. Wear  gloves when cleaning surfaces the patient has come in contact with. Use a diluted bleach solution (e.g., dilute bleach with 1 part bleach and 10 parts water) or a household disinfectant with a label that says EPA-registered for coronaviruses. To make a bleach solution at home, add 1 tablespoon of bleach to 1 quart (4 cups) of water. For a larger supply, add  cup of bleach to 1 gallon (16 cups) of water. Read labels of cleaning products and follow recommendations provided on product labels. Labels  contain instructions for safe and effective use of the cleaning product including precautions you should take when applying the product, such as wearing gloves or eye protection and making sure you have good ventilation during use of the product. Remove gloves and wash hands immediately after cleaning.  Monitor yourself for signs and symptoms of illness Caregivers and household members are considered close contacts, should monitor their health, and will be asked to limit movement outside of the home to the extent possible. Follow the monitoring steps for close contacts listed on the symptom monitoring form.   ? If you have additional questions, contact your local health department or call the epidemiologist on call at [phone] (available 24/7). ? This guidance is subject to change. For the most up-to-date guidance from [facility], please refer to their website: [email or web address]

## 2019-05-08 LAB — NOVEL CORONAVIRUS, NAA (HOSP ORDER, SEND-OUT TO REF LAB; TAT 18-24 HRS): SARS-CoV-2, NAA: NOT DETECTED

## 2019-10-23 ENCOUNTER — Other Ambulatory Visit: Payer: Self-pay

## 2019-10-23 DIAGNOSIS — Z20822 Contact with and (suspected) exposure to covid-19: Secondary | ICD-10-CM

## 2019-10-26 ENCOUNTER — Telehealth: Payer: Self-pay

## 2019-10-26 ENCOUNTER — Telehealth: Payer: Self-pay | Admitting: *Deleted

## 2019-10-26 LAB — NOVEL CORONAVIRUS, NAA: SARS-CoV-2, NAA: DETECTED — AB

## 2019-10-26 NOTE — Telephone Encounter (Signed)
Received call from patient checking Covid results.  Advised results are positive.  Advised to contact PCP or visit ED if symptoms worsen.   

## 2019-10-26 NOTE — Telephone Encounter (Signed)
Patient called for results ,still pending . 

## 2019-10-27 ENCOUNTER — Ambulatory Visit: Payer: Self-pay | Admitting: *Deleted

## 2019-10-27 ENCOUNTER — Telehealth: Payer: Self-pay | Admitting: Critical Care Medicine

## 2019-10-27 NOTE — Telephone Encounter (Signed)
Pt reports positive covid. Reports H/O asthma Pt questioning if appropriate to give herself nebs treatment. States they have helped in past, has at home. Denies wheezing, denies any SOB. States afebrile. Advised to adm nebs treatment. Advised ED of any SOB occurs, tightness worsens in intensity or frequency. Pt verbalizes understanding.   Answer Assessment - Initial Assessment Questions 1.   NAME of MEDICATION: "What medicine are you calling about?"    Neb treatments 2.   QUESTION: "What is your question?"     *O to administer with covid 3.   PRESCRIBING HCP: "Who prescribed it?" Reason: if prescribed by specialist, call should be referred to that group.     *No Answer* 4. SYMPTOMS: "Do you have any symptoms?"     Chest tightness 5. SEVERITY: If symptoms are present, ask "Are they mild, moderate or severe?"    mild  Protocols used: MEDICATION QUESTION CALL-A-AH

## 2019-10-27 NOTE — Telephone Encounter (Signed)
I connected with this patient who is Covid positive she has been sick since 3 December she was tested on 5 December and is positive she is obese with a BMI of greater than 40.  She states she is having abdominal pain nausea some cough some wheezing she does have asthma she has been using her albuterol inhaler I described to her the monoclonal antibody and she is interested in this but wants to think about it overnight I am going to have one of our colleagues call her in the morning to see if she is made a decision

## 2019-10-28 ENCOUNTER — Other Ambulatory Visit: Payer: Self-pay | Admitting: Nurse Practitioner

## 2019-10-28 DIAGNOSIS — U071 COVID-19: Secondary | ICD-10-CM

## 2019-10-28 NOTE — Progress Notes (Signed)
  I connected by phone with Melissa Guerra on 10/28/2019 at 8:33 AM to discuss the potential use of an new treatment for mild to moderate COVID-19 viral infection in non-hospitalized patients.  This patient is a 38 y.o. female that meets the FDA criteria for Emergency Use Authorization of bamlanivimab or casirivimab\imdevimab.  Has a (+) direct SARS-CoV-2 viral test result  Has mild or moderate COVID-19   Is ? 38 years of age and weighs ? 40 kg  Is NOT hospitalized due to COVID-19  Is NOT requiring oxygen therapy or requiring an increase in baseline oxygen flow rate due to COVID-19  Is within 10 days of symptom onset  Has at least one of the high risk factor(s) for progression to severe COVID-19 and/or hospitalization as defined in EUA.  Specific high risk criteria : BMI >/= 35  Patient Active Problem List   Diagnosis Date Noted  . 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 FDA has authorized the emergency use of bamlanivimab for the treatment of mild to moderate COVID-19 in adults and pediatric patients with positive results of direct SARS-CoV-2 viral testing who are 8 years of age and older weighing at least 40 kg, and who are at high risk for progressing to severe COVID-19 and/or hospitalization.  2. The significant known and potential risks and benefits of bamlanivimab, and the extent to which such potential risks and benefits are unknown.  3. Information on available alternative treatments and the risks and benefits of those alternatives, including clinical trials.  4. Patients treated with bamlanivimab should continue to self-isolate and use infection control measures (e.g., wear mask, isolate, social distance, avoid sharing personal items, clean and disinfect "high touch" surfaces, and frequent handwashing) according to CDC guidelines.   5. The patient or parent/caregiver  has the option to accept or refuse bamlanivimab.  After reviewing this information with the patient, The patient agreed to proceed with receiving the infusion of bamlanivimab and will be provided a copy of the Fact sheet prior to receiving the infusion.Fenton Foy 10/28/2019 8:33 AM

## 2019-10-29 ENCOUNTER — Ambulatory Visit (HOSPITAL_COMMUNITY)
Admission: RE | Admit: 2019-10-29 | Discharge: 2019-10-29 | Disposition: A | Discharge status: Home / Self Care | Payer: Medicaid Other | Source: Ambulatory Visit | Attending: Pulmonary Disease | Admitting: Pulmonary Disease

## 2019-10-29 DIAGNOSIS — U071 COVID-19: Secondary | ICD-10-CM | POA: Diagnosis present

## 2019-10-29 DIAGNOSIS — Z6841 Body Mass Index (BMI) 40.0 and over, adult: Secondary | ICD-10-CM | POA: Diagnosis present

## 2019-10-29 MED ORDER — SODIUM CHLORIDE 0.9 % IV SOLN
700.0000 mg | Freq: Once | INTRAVENOUS | Status: AC
  Administered 2019-10-29: 700 mg via INTRAVENOUS
  Filled 2019-10-29: qty 20

## 2019-10-29 MED ORDER — METHYLPREDNISOLONE SODIUM SUCC 125 MG IJ SOLR: 125.0000 mg | Freq: Once | INTRAMUSCULAR | Status: DC | PRN

## 2019-10-29 MED ORDER — EPINEPHRINE 0.3 MG/0.3ML IJ SOAJ: 0.3000 mg | Freq: Once | INTRAMUSCULAR | Status: DC | PRN

## 2019-10-29 MED ORDER — DIPHENHYDRAMINE HCL 50 MG/ML IJ SOLN: 50.0000 mg | Freq: Once | INTRAMUSCULAR | Status: DC | PRN

## 2019-10-29 MED ORDER — ALBUTEROL SULFATE HFA 108 (90 BASE) MCG/ACT IN AERS: 2.0000 | INHALATION_SPRAY | Freq: Once | RESPIRATORY_TRACT | Status: DC | PRN

## 2019-10-29 MED ORDER — FAMOTIDINE IN NACL 20-0.9 MG/50ML-% IV SOLN: 20.0000 mg | Freq: Once | INTRAVENOUS | Status: DC | PRN

## 2019-10-29 MED ORDER — SODIUM CHLORIDE 0.9 % IV SOLN
INTRAVENOUS | Status: DC | PRN
  Administered 2019-10-29: 1000 mL via INTRAVENOUS

## 2019-10-29 NOTE — Progress Notes (Signed)
.   Diagnosis: COVID-19  Physician: Dr. Wright  Procedure: [facility] Med: bamlanivimab infusion - Provided patient with bamlanimivab fact sheet for patients, parents and caregivers prior to infusion.  Complications: No immediate complications noted.  Discharge: Discharged home   Melissa Guerra A 10/29/2019   

## 2019-11-04 ENCOUNTER — Telehealth: Payer: Medicaid Other | Admitting: Emergency Medicine

## 2019-11-04 DIAGNOSIS — J019 Acute sinusitis, unspecified: Secondary | ICD-10-CM

## 2019-11-04 DIAGNOSIS — U071 COVID-19: Secondary | ICD-10-CM

## 2019-11-04 MED ORDER — PREDNISONE 20 MG PO TABS: 40.0000 mg | ORAL_TABLET | Freq: Every day | ORAL | 0 refills | Status: DC

## 2019-11-04 MED ORDER — ALBUTEROL SULFATE HFA 108 (90 BASE) MCG/ACT IN AERS: 2.0000 | INHALATION_SPRAY | Freq: Four times a day (QID) | RESPIRATORY_TRACT | 0 refills | Status: DC | PRN

## 2019-11-04 MED ORDER — AMOXICILLIN-POT CLAVULANATE 875-125 MG PO TABS: 1.0000 | ORAL_TABLET | Freq: Two times a day (BID) | ORAL | 0 refills | Status: DC

## 2019-11-04 NOTE — Progress Notes (Signed)
Time spent: 10 min  Melissa Guerra  I have reviewed your chart.  You tested positive for COVID-19 on 12/5.    The symptoms you are having a probably either from the virus.  However, when sinus issues continue for 7-10 days or longer sometimes it is due to a bacteria.  We will treat you for bacterial sinusitis.  The virus may have flared your asthma as well causing cough.   We are sorry that you are not feeling well.  Here is how we plan to help!  Based on what you have shared with me it looks like you have sinusitis.  Sinusitis is inflammation and infection in the sinus cavities of the head.  Based on your presentation I believe you most likely have Acute Bacterial Sinusitis.  This is an infection caused by bacteria and is treated with antibiotics. I have prescribed Augmentin 875mg /125mg  one tablet twice daily with food, for 7 days. You may use an oral decongestant such as Mucinex D or if you have glaucoma or high blood pressure use plain Mucinex. Saline nasal spray help and can safely be used as often as needed for congestion.  If you develop worsening sinus pain, fever or notice severe headache and vision changes, or if symptoms are not better after completion of antibiotic, please schedule an appointment with a health care provider.    Sinus infections are not as easily transmitted as other respiratory infection, however we still recommend that you avoid close contact with loved ones, especially the very young and elderly.  Remember to wash your hands thoroughly throughout the day as this is the number one way to prevent the spread of infection!  Home Care:  Only take medications as instructed by your medical team.  Complete the entire course of an antibiotic.  Do not take these medications with alcohol.  A steam or ultrasonic humidifier can help congestion.  You can place a towel over your head and breathe in the steam from hot water coming from a faucet.  Avoid close contacts especially the  very young and the elderly.  Cover your mouth when you cough or sneeze.  Always remember to wash your hands.  Here are other over the counter medicines that can be used along with rest, fluids for your symptoms:   You can use over the counter medications to help with symptoms: ? [phone] mg acetaminophen (tylenol) every 6 hours, around the clock to help with associated fevers, sore throat, headaches, generalized body aches and malaise.  ? Oxymetazoline (afrin) intranasal spray once daily for no more than 3 days to help with congestion, after 3 days you can switch to another over-the-counter nasal steroid spray such as fluticasone (flonase). Overuse of oxymetazoline can cause rebound nasal congestion, irritation and bleeding. ? Allergy medication (loratadine, cetirizine, etc) and phenylephrine (sudafed) help with nasal congestion, runny nose and postnasal drip.   ? Intranasal saline rinses, humidifier, or Neti pot nasal rinses can also relieve some nasal congestion, runny nose or post nasal drip  Continue all your asthma medicications as well including zyrtec, singulair, breathing treatments, etc.   I have prescribed a short course of prednisone (steroids) to help with associated mild cough from asthma flare.    Get Help Right Away If:  You develop worsening fever or sinus pain.  You develop a severe head ache or visual changes.  Your symptoms persist after you have completed your treatment plan.  Make sure you  Understand these instructions.  Will watch your  condition.  Will get help right away if you are not doing well or get worse.  Your e-visit answers were reviewed by a board certified advanced clinical practitioner to complete your personal care plan.  Depending on the condition, your plan could have included both over the counter or prescription medications.  If there is a problem please reply  once you have received a response from your provider.  Your safety is important  to [place].  If you have drug allergies check your prescription carefully.    You can use MyChart to ask questions about today's visit, request a non-urgent call back, or ask for a work or school excuse for 24 hours related to this e-Visit. If it has been greater than 24 hours you will need to follow up with your provider, or enter a new e-Visit to address those concerns.  You will get an e-mail in the next two days asking about your experience.  I hope that your e-visit has been valuable and will speed your recovery. Thank you for using e-visits.   I hope you feel better,  Sharen Heck, PA-C

## 2019-12-01 ENCOUNTER — Ambulatory Visit
Admission: EM | Admit: 2019-12-01 | Discharge: 2019-12-01 | Disposition: A | Discharge status: Home / Self Care | Payer: Medicaid Other | Attending: Physician Assistant | Admitting: Physician Assistant

## 2019-12-01 ENCOUNTER — Other Ambulatory Visit: Payer: Self-pay

## 2019-12-01 DIAGNOSIS — Z8616 Personal history of COVID-19: Secondary | ICD-10-CM | POA: Diagnosis not present

## 2019-12-01 DIAGNOSIS — N898 Other specified noninflammatory disorders of vagina: Secondary | ICD-10-CM

## 2019-12-01 DIAGNOSIS — J3489 Other specified disorders of nose and nasal sinuses: Secondary | ICD-10-CM

## 2019-12-01 DIAGNOSIS — R109 Unspecified abdominal pain: Secondary | ICD-10-CM

## 2019-12-01 LAB — POCT URINALYSIS DIP (MANUAL ENTRY)
Bilirubin, UA: NEGATIVE
Glucose, UA: NEGATIVE mg/dL
Ketones, POC UA: NEGATIVE mg/dL
Leukocytes, UA: NEGATIVE
Nitrite, UA: NEGATIVE
Protein Ur, POC: NEGATIVE mg/dL
Spec Grav, UA: 1.025 (ref 1.010–1.025)
Urobilinogen, UA: 0.2 E.U./dL
pH, UA: 6 (ref 5.0–8.0)

## 2019-12-01 MED ORDER — ALBUTEROL SULFATE HFA 108 (90 BASE) MCG/ACT IN AERS: 2.0000 | INHALATION_SPRAY | Freq: Four times a day (QID) | RESPIRATORY_TRACT | 0 refills | Status: DC | PRN

## 2019-12-01 MED ORDER — METHOCARBAMOL 500 MG PO TABS: 500.0000 mg | ORAL_TABLET | Freq: Two times a day (BID) | ORAL | 0 refills | Status: DC

## 2019-12-01 MED ORDER — MELOXICAM 7.5 MG PO TABS: 7.5000 mg | ORAL_TABLET | Freq: Every day | ORAL | 0 refills | Status: DC

## 2019-12-01 MED ORDER — ALBUTEROL SULFATE (2.5 MG/3ML) 0.083% IN NEBU: 2.5000 mg | INHALATION_SOLUTION | Freq: Four times a day (QID) | RESPIRATORY_TRACT | 0 refills | Status: DC | PRN

## 2019-12-01 MED ORDER — FLUTICASONE PROPIONATE 50 MCG/ACT NA SUSP: 2.0000 | Freq: Every day | NASAL | 0 refills | Status: DC

## 2019-12-01 MED ORDER — MONTELUKAST SODIUM 10 MG PO TABS: 10.0000 mg | ORAL_TABLET | Freq: Every day | ORAL | 0 refills | Status: DC

## 2019-12-01 MED ORDER — AZELASTINE HCL 0.1 % NA SOLN: 2.0000 | Freq: Two times a day (BID) | NASAL | 0 refills | Status: DC

## 2019-12-01 NOTE — ED Provider Notes (Signed)
[name]    CSN: [id number] Arrival date & time: 12/01/19  1220      History   Chief Complaint Chief Complaint  Patient presents with  . Flank Pain    HPI Melissa Guerra is a 39 y.o. female.   39 year old female comes in for multiple complaints.  1. Right flank pain. Patient states was tested positive for COVID 10/23/2019. At the time had fever, cough, body aches, nausea, diarrhea, chills, loss of smell, shortness of breath. Left quarantine 11/02/2019. States most symptoms had resolved by then. Had residual dyspnea on exertion that has not worsened. States during COVID, did have bilateral flank pain. Right flank pain continued, intermittent, worse with movement. She does have intermittent spasms. 2 day of intermittent cough, left ear pain, sinus pressure. Denies fever, chills, body aches. Has had some urinary frequency without dysuria, hematuria. Denies abdominal pain, nausea, vomiting, diarrhea.  2. 3 day history of vaginal itching. Recently shaved and wondered if this is the cause. Has had some clear vaginal discharge. Sexually active with one female partner. No obvious hygiene product changes. Finished augmentin a few weeks ago without vaginal symptoms.     97%    Pt states covid positive on 12/05, states still having rt flank pain. States pain worse on movement. Pt also c/o vaginal irritation x3 days, lt ear pain and sinus x3-4 days     Past Medical History:  Diagnosis Date  . Abnormal Pap smear   . Acid reflux   . Asthma   . Obesity     Patient Active Problem List   Diagnosis Date Noted  . 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 FOOT SURGERY    . TONSILLECTOMY    . TONSILLECTOMY AND ADENOIDECTOMY    . TONSILLECTOMY AND ADENOIDECTOMY    . WISDOM TOOTH EXTRACTION      OB History    Gravida  2   Para  2   Term      Preterm      AB      Living        SAB        TAB      Ectopic      Multiple      Live Births               Home Medications    Prior to Admission medications   Medication Sig Start Date End Date Taking? Authorizing Provider  albuterol (PROVENTIL) (2.5 MG/3ML) 0.083% nebulizer solution Take 3 mLs (2.5 mg total) by nebulization every 6 (six) hours as needed for wheezing or shortness of breath. 12/01/19   Cathie Hoops, Lisbet Busker V, PA-C  albuterol (VENTOLIN HFA) 108 (90 Base) MCG/ACT inhaler Inhale 2 puffs into the lungs every 6 (six) hours as needed for wheezing or shortness of breath. 12/01/19   Cathie Hoops, Earlie Schank V, PA-C  azelastine (ASTELIN) 0.1 % nasal spray Place 2 sprays into both nostrils 2 (two) times daily. 12/01/19   Cathie Hoops, Dishawn Bhargava V, PA-C  cetaphil (CETAPHIL) lotion Apply 1 application topically as needed for dry skin.    [provider]  cetirizine (ZYRTEC) 10 MG tablet Take 10 mg by mouth daily.    [provider]  fluticasone (FLONASE) 50 MCG/ACT nasal spray Place 2 sprays into both nostrils daily. 12/01/19   Cathie Hoops, Kien Mirsky V, PA-C  ibuprofen (ADVIL,MOTRIN) 200 MG tablet Take 400 mg by mouth every  6 (six) hours as needed for headache or mild pain.    [provider]  meloxicam (MOBIC) 7.5 MG tablet Take 1 tablet (7.5 mg total) by mouth daily. 12/01/19   Tasia Catchings, Deantre Bourdon V, PA-C  methocarbamol (ROBAXIN) 500 MG tablet Take 1 tablet (500 mg total) by mouth 2 (two) times daily. 12/01/19   Tasia Catchings, Detrice Cales V, PA-C  montelukast (SINGULAIR) 10 MG tablet Take 1 tablet (10 mg total) by mouth at bedtime. 12/01/19   Tasia Catchings, Krikor Willet V, PA-C  Vitamin D, Ergocalciferol, (DRISDOL) 1.25 MG (50000 UT) CAPS capsule Take 1 capsule (50,000 Units total) by mouth every 7 (seven) days. 01/26/19   Delia Heady, PA-C    Family History Family History  Problem Relation Age of Onset  . Cancer Mother   . HIV Mother   . Diabetes Maternal Grandmother   . Diabetes Paternal Grandmother   . Heart failure Father     Social History Social History   Tobacco Use  . Smoking status:  Former Smoker    Types: Cigarettes  . Smokeless tobacco: Never Used  Substance Use Topics  . Alcohol use: No  . Drug use: No     Allergies   Codeine, Peanut-containing drug products, and Shrimp [shellfish allergy]   Review of Systems Review of Systems  Reason unable to perform ROS: See HPI as above.     Physical Exam Triage Vital Signs ED Triage Vitals [12/01/19 1235]  Enc Vitals Group     BP (!) 141/85     Pulse Rate 83     Resp 18     Temp 98.5 F (36.9 C)     Temp Source Oral     SpO2      Weight      Height      Head Circumference      Peak Flow      Pain Score 6     Pain Loc      Pain Edu?      Excl. in [place]?    No data found.  Updated Vital Signs BP (!) 141/85 (BP Location: Left Arm)   Pulse 83   Temp 98.5 F (36.9 C) (Oral)   Resp 18   LMP 11/12/2019   SpO2 96%   Physical Exam Constitutional:      General: She is not in acute distress.    Appearance: Normal appearance. She is not ill-appearing, toxic-appearing or diaphoretic.  HENT:     Head: Normocephalic and atraumatic.     Mouth/Throat:     Mouth: Mucous membranes are moist.     Pharynx: Oropharynx is clear. Uvula midline.  Eyes:     Extraocular Movements: Extraocular movements intact.     Conjunctiva/sclera: Conjunctivae normal.     Pupils: Pupils are equal, round, and reactive to light.  Cardiovascular:     Rate and Rhythm: Normal rate and regular rhythm.     Heart sounds: Normal heart sounds. No murmur. No friction rub. No gallop.   Pulmonary:     Effort: Pulmonary effort is normal. No accessory muscle usage, prolonged expiration, respiratory distress or retractions.     Comments: Speaking in full sentences without difficulty. Lungs clear to auscultation without adventitious lung sounds. Abdominal:     General: Bowel sounds are normal.     Palpations: Abdomen is soft.     Tenderness: There is no abdominal tenderness. There is no right CVA tenderness, left CVA tenderness, guarding or  rebound.  Musculoskeletal:  Cervical back: Normal range of motion and neck supple.     Comments: No rashes, swelling, warmth. No tenderness to palpation of spinous processes. Tenderness to palpation of right flank pain. Full ROM of back.  Skin:    General: Skin is warm and dry.  Neurological:     General: No focal deficit present.     Mental Status: She is alert and oriented to person, place, and time.      UC Treatments / Results  Labs (all labs ordered are listed, but only abnormal results are displayed) Labs Reviewed  POCT URINALYSIS DIP (MANUAL ENTRY) - Abnormal; Notable for the following components:      Result Value   Blood, UA small (*)    All other components within normal limits    EKG   Radiology No results found.  Procedures Procedures (including critical care time)  Medications Ordered in UC Medications - No data to display  Initial Impression / Assessment and Plan / UC Course  I have reviewed the triage vital signs and the nursing notes.  Pertinent labs & imaging results that were available during my care of the patient were reviewed by me and considered in my medical decision making (see chart for details).    1. Right flank pain Most likely due to muscle strain. ? Urethral stone given small blood in urine with intermittent cramping to the flank area. Patient without CVA tenderness, nausea/vomiting, lower suspicion for obstruction stone. Will provide mobic, robaxin for now. Push fluids. Return precautions given.  2. Vaginal itching Finished abx 2-3 weeks ago, but itching did not start until after shaving. Minimal clear discharge, low suspicion for yeast. Monogamous relationship, patient not worried for STDs. Will have patient monitor for possible irritation from shaving. Avoid changes in hygiene product. Return precautions given.  3. Sinus pressure Restarted 3 days ago. Has had these symptoms in the past from allergies. Given recent COVID infection,  low suspicion for reinfection at this time. Will provide symptomatic treatment. Also refill albuterol/singulair at this time. Return precautions given.  Patient expresses understanding and agrees to plan.  Final Clinical Impressions(s) / UC Diagnoses   Final diagnoses:  Right flank pain  Vaginal itching  Sinus pressure   ED Prescriptions    Medication Sig Dispense Auth. Provider   albuterol (PROVENTIL) (2.5 MG/3ML) 0.083% nebulizer solution  (Status: Discontinued) Take 3 mLs (2.5 mg total) by nebulization every 6 (six) hours as needed for wheezing or shortness of breath. 75 mL Madoc Holquin V, PA-C   albuterol (VENTOLIN HFA) 108 (90 Base) MCG/ACT inhaler Inhale 2 puffs into the lungs every 6 (six) hours as needed for wheezing or shortness of breath. 8 g Jlon Betker V, PA-C   montelukast (SINGULAIR) 10 MG tablet Take 1 tablet (10 mg total) by mouth at bedtime. 30 tablet Rogerio Boutelle V, PA-C   methocarbamol (ROBAXIN) 500 MG tablet Take 1 tablet (500 mg total) by mouth 2 (two) times daily. 20 tablet Nanako Stopher V, PA-C   fluticasone (FLONASE) 50 MCG/ACT nasal spray Place 2 sprays into both nostrils daily. 1 g Railyn House V, PA-C   azelastine (ASTELIN) 0.1 % nasal spray Place 2 sprays into both nostrils 2 (two) times daily. 30 mL Lisia Westbay V, PA-C   meloxicam (MOBIC) 7.5 MG tablet Take 1 tablet (7.5 mg total) by mouth daily. 14 tablet Grizel Vesely V, PA-C   albuterol (PROVENTIL) (2.5 MG/3ML) 0.083% nebulizer solution Take 3 mLs (2.5 mg total) by nebulization every 6 (six)  hours as needed for wheezing or shortness of breath. 75 mL Belinda Fisher, PA-C     PDMP not reviewed this encounter.   Belinda Fisher, PA-C 12/01/19 1402

## 2019-12-01 NOTE — ED Triage Notes (Signed)
Pt states covid positive on 12/05, states still having rt flank pain. States pain worse on movement. Pt also c/o vaginal irritation x3 days, lt ear pain and sinus x3-4 days

## 2019-12-01 NOTE — Discharge Instructions (Addendum)
Right flank pain No alarming signs on exam.  No signs of pneumonia.  As discussed, this could be due to kidney stone or muscle strain. Start Mobic. Do not take ibuprofen (motrin/advil)/ naproxen (aleve) while on mobic. Robaxin as needed, this can make you drowsy, so do not take if you are going to drive, operate heavy machinery, or make important decisions.  Heat compress as needed.  Stay hydrated, urine should be clear to pale yellow in color.  Follow-up with PCP for further evaluation if symptoms not improving. Vaginal itching Urine negative for infection.  As discussed, small blood that could be due to kidney stone.  This could also be due to irritation.  You can recheck in about 1 to 2 months to make sure blood has resolved.  Stay hydrated.  Avoid hygiene product to the vaginal reason for now, and continue to monitor symptoms.  If noticing abdominal pain, nausea/vomiting, worsening vaginal discharge, return for further evaluation. Sinus pressure Start Flonase, azelastine as directed.  I have also refilled your Singulair, albuterol as needed.

## 2019-12-22 IMAGING — CR DG CHEST 2V
2 series · 2 of 2 positions shown · non-contrast
Comparison: 01/09/2014

CLINICAL DATA: Productive cough for several days

EXAM:
CHEST - 2 VIEW

[w chest pa]
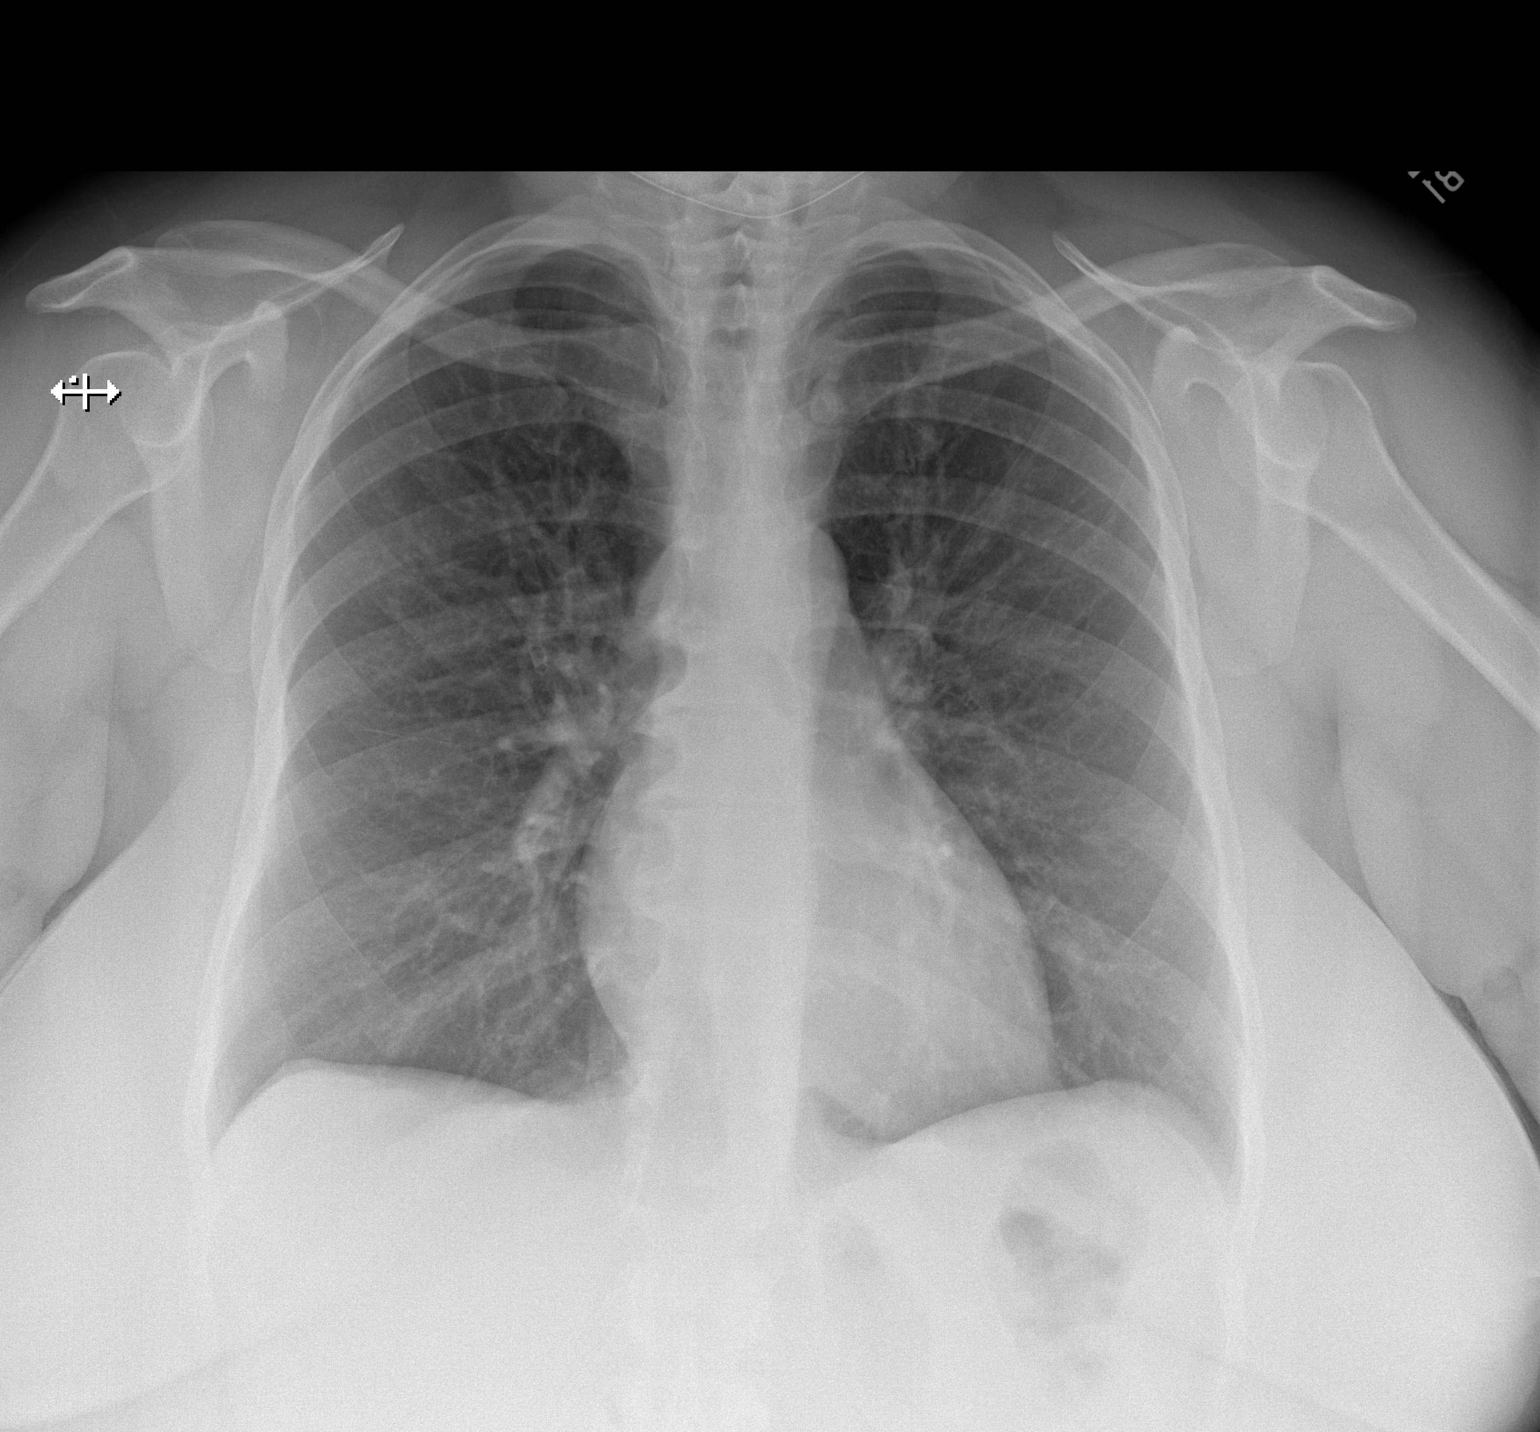

[w chest lat]
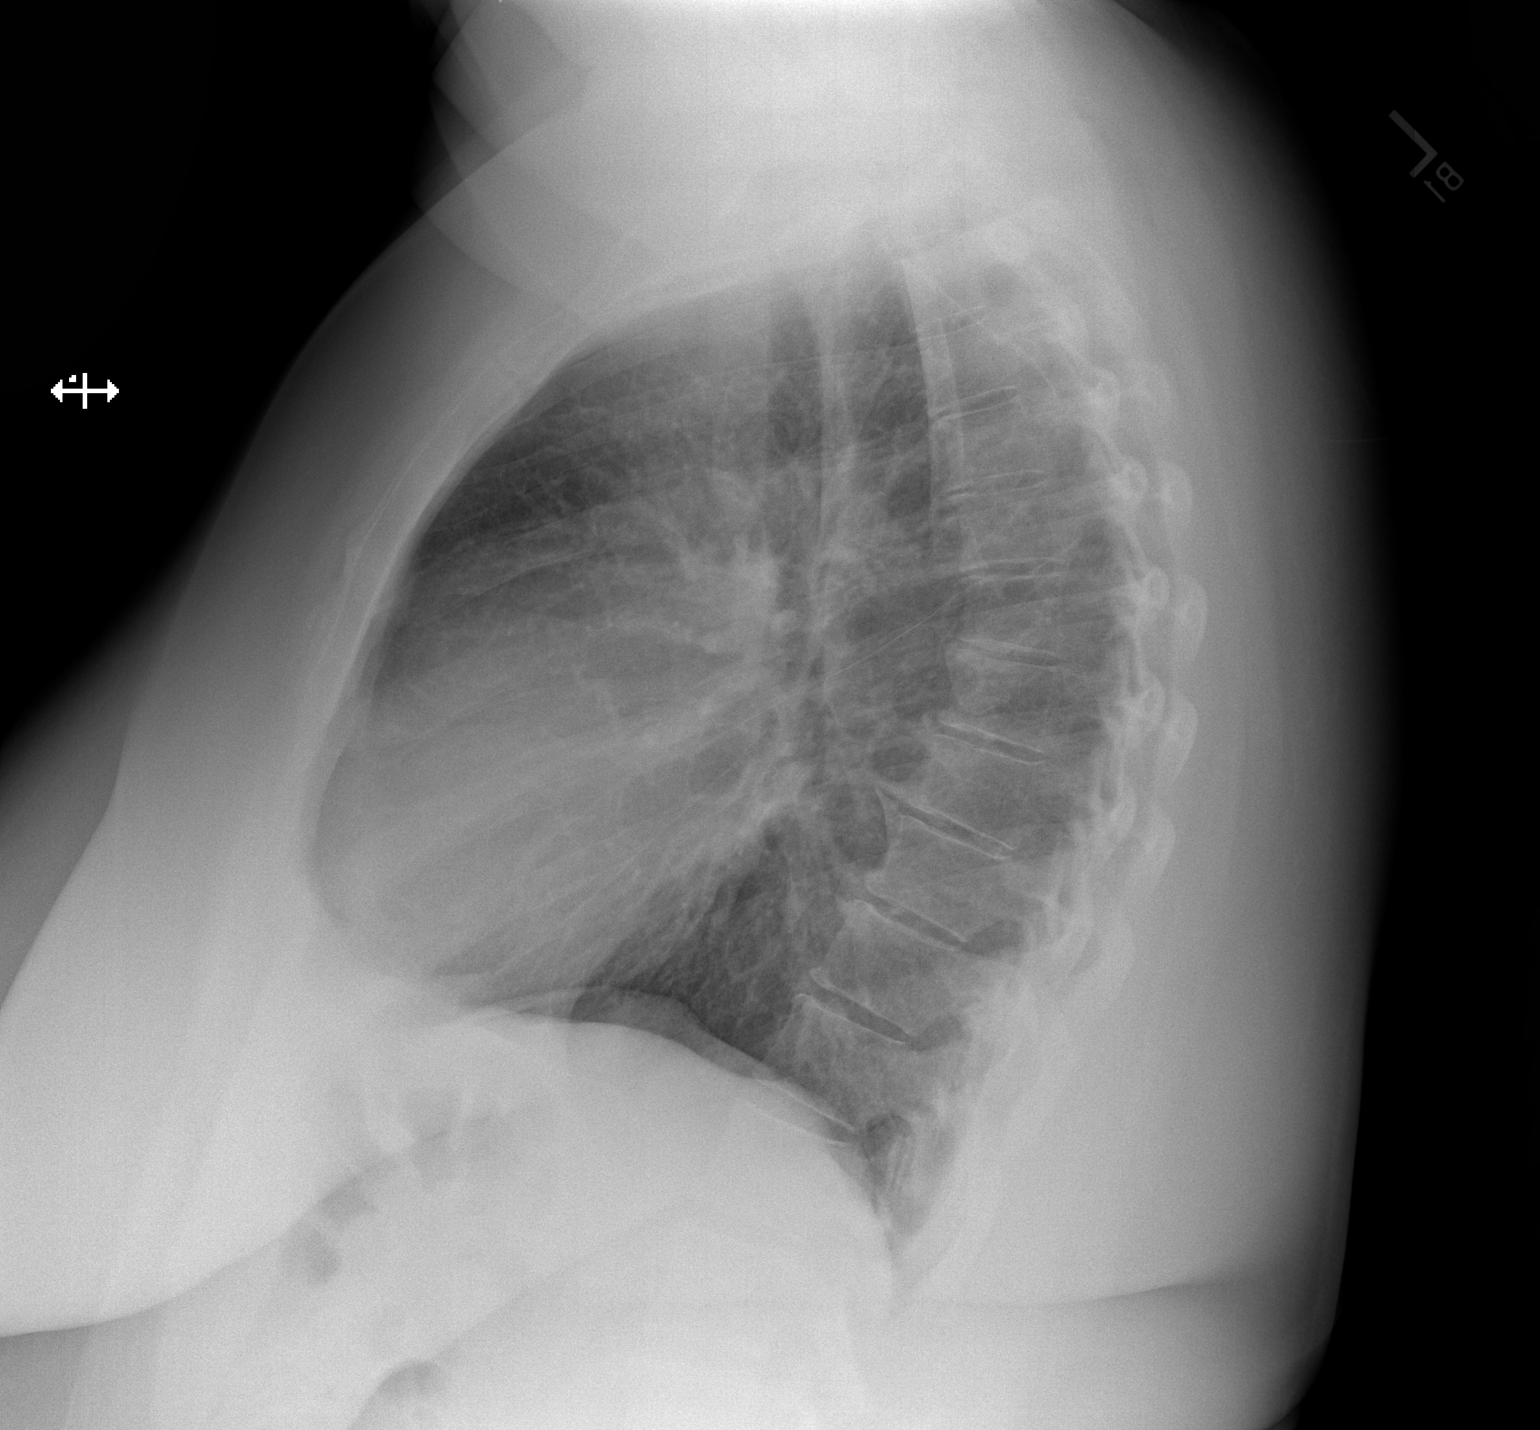

[2 of 2 positions shown; findings below may reference images not displayed]

FINDINGS: The heart size and mediastinal contours are within normal limits.
Both lungs are clear. The visualized skeletal structures show
degenerative change of the thoracic spine..
IMPRESSION: No active cardiopulmonary disease.

## 2019-12-25 DIAGNOSIS — R0602 Shortness of breath: Secondary | ICD-10-CM | POA: Diagnosis not present

## 2019-12-25 DIAGNOSIS — J45909 Unspecified asthma, uncomplicated: Secondary | ICD-10-CM | POA: Diagnosis not present

## 2019-12-25 DIAGNOSIS — R0600 Dyspnea, unspecified: Secondary | ICD-10-CM | POA: Diagnosis not present

## 2019-12-25 DIAGNOSIS — M549 Dorsalgia, unspecified: Secondary | ICD-10-CM | POA: Diagnosis not present

## 2019-12-25 DIAGNOSIS — Z8616 Personal history of COVID-19: Secondary | ICD-10-CM | POA: Diagnosis not present

## 2019-12-25 DIAGNOSIS — U071 COVID-19: Secondary | ICD-10-CM | POA: Diagnosis not present

## 2019-12-25 DIAGNOSIS — Z6841 Body Mass Index (BMI) 40.0 and over, adult: Secondary | ICD-10-CM | POA: Diagnosis not present

## 2019-12-25 DIAGNOSIS — Z79899 Other long term (current) drug therapy: Secondary | ICD-10-CM | POA: Diagnosis not present

## 2019-12-25 DIAGNOSIS — E669 Obesity, unspecified: Secondary | ICD-10-CM | POA: Diagnosis not present

## 2019-12-25 DIAGNOSIS — R0989 Other specified symptoms and signs involving the circulatory and respiratory systems: Secondary | ICD-10-CM | POA: Diagnosis not present

## 2020-01-11 DIAGNOSIS — U071 COVID-19: Secondary | ICD-10-CM | POA: Diagnosis not present

## 2020-01-11 DIAGNOSIS — Z7189 Other specified counseling: Secondary | ICD-10-CM | POA: Diagnosis not present

## 2020-01-11 DIAGNOSIS — Z03818 Encounter for observation for suspected exposure to other biological agents ruled out: Secondary | ICD-10-CM | POA: Diagnosis not present

## 2020-01-27 ENCOUNTER — Telehealth: Payer: Medicaid Other | Admitting: Family

## 2020-01-27 DIAGNOSIS — R0600 Dyspnea, unspecified: Secondary | ICD-10-CM

## 2020-01-27 NOTE — Progress Notes (Signed)
Based on what you shared with me, I feel your condition warrants further evaluation and I recommend that you be seen for a face to face visit.  Please contact your primary care physician practice to be seen. Many offices offer virtual options to be seen via video if you are not comfortable going in person to a medical facility at this time.  If you do not have a PCP, [facility] offers a free physician referral service available at [phone]. Our trained staff has the experience, knowledge and resources to put you in touch with a physician who is right for you.   You also have the option of a video visit through [email or web address]  If you are having a true medical emergency please call 911.  NOTE: If you entered your credit card information for this eVisit, you will not be charged. You may see a "hold" on your card for the $35 but that hold will drop off and you will not have a charge processed.  Your e-visit answers were reviewed by a board certified advanced clinical practitioner to complete your personal care plan.  Thank you for using e-Visits.  

## 2020-06-08 ENCOUNTER — Other Ambulatory Visit: Payer: Self-pay

## 2020-06-08 ENCOUNTER — Ambulatory Visit
Admission: RE | Admit: 2020-06-08 | Discharge: 2020-06-08 | Disposition: A | Discharge status: Home / Self Care | Payer: Medicaid Other | Source: Ambulatory Visit | Attending: Family Medicine | Admitting: Family Medicine

## 2020-06-08 VITALS — BP 134/84 | HR 79 | Temp 98.5°F | Resp 18

## 2020-06-08 DIAGNOSIS — B9689 Other specified bacterial agents as the cause of diseases classified elsewhere: Secondary | ICD-10-CM | POA: Diagnosis not present

## 2020-06-08 DIAGNOSIS — Z1152 Encounter for screening for COVID-19: Secondary | ICD-10-CM

## 2020-06-08 DIAGNOSIS — J019 Acute sinusitis, unspecified: Secondary | ICD-10-CM | POA: Diagnosis not present

## 2020-06-08 MED ORDER — AMOXICILLIN-POT CLAVULANATE 875-125 MG PO TABS: 1.0000 | ORAL_TABLET | Freq: Two times a day (BID) | ORAL | 0 refills | Status: DC

## 2020-06-08 MED ORDER — CETIRIZINE HCL 10 MG PO TABS: 10.0000 mg | ORAL_TABLET | Freq: Every day | ORAL | 2 refills | Status: DC

## 2020-06-08 NOTE — ED Triage Notes (Signed)
Pt presents to [facility] for assessment of 3 days of headaches, left ear pain, abdominal pain.

## 2020-06-08 NOTE — ED Provider Notes (Signed)
[name]    CSN: [id number] Arrival date & time: 06/08/20  5573      History   Chief Complaint Chief Complaint  Patient presents with  . Headache  . Otalgia    HPI Melissa Guerra is a 39 y.o. female.   HPI Melissa Guerra is a 39 y.o. female , history of COVID-19 in December, chronic well-controlled asthma,  and obesity who complains of congestion, sneezing and headache for the last few days. She denies any known exposures to any sick individuals. She endorses abdominal upset and one episode of vomitus with yellow mucus yesterday. She has not experienced fever. She is unvaccinated against COVID, however, considering vaccination. Denies SOB, CP, dizziness, or weakness.  Past Medical History:  Diagnosis Date  . Abnormal Pap smear   . Acid reflux   . Asthma   . Obesity     Patient Active Problem List   Diagnosis Date Noted  . 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 FOOT SURGERY    . TONSILLECTOMY    . TONSILLECTOMY AND ADENOIDECTOMY    . TONSILLECTOMY AND ADENOIDECTOMY    . WISDOM TOOTH EXTRACTION      OB History    Gravida  2   Para  2   Term      Preterm      AB      Living        SAB      TAB      Ectopic      Multiple      Live Births               Home Medications    Prior to Admission medications   Medication Sig Start Date End Date Taking? Authorizing Provider  albuterol (PROVENTIL) (2.5 MG/3ML) 0.083% nebulizer solution Take 3 mLs (2.5 mg total) by nebulization every 6 (six) hours as needed for wheezing or shortness of breath. 12/01/19   Cathie Hoops, Amy V, PA-C  albuterol (VENTOLIN HFA) 108 (90 Base) MCG/ACT inhaler Inhale 2 puffs into the lungs every 6 (six) hours as needed for wheezing or shortness of breath. 12/01/19   Cathie Hoops, Amy V, PA-C  azelastine (ASTELIN) 0.1 % nasal spray Place 2 sprays into both nostrils 2 (two) times daily. 12/01/19   Cathie Hoops, Amy V,  PA-C  cetaphil (CETAPHIL) lotion Apply 1 application topically as needed for dry skin.    [provider]  cetirizine (ZYRTEC) 10 MG tablet Take 10 mg by mouth daily.    [provider]  fluticasone (FLONASE) 50 MCG/ACT nasal spray Place 2 sprays into both nostrils daily. 12/01/19   Cathie Hoops, Amy V, PA-C  ibuprofen (ADVIL,MOTRIN) 200 MG tablet Take 400 mg by mouth every 6 (six) hours as needed for headache or mild pain.    [provider]  meloxicam (MOBIC) 7.5 MG tablet Take 1 tablet (7.5 mg total) by mouth daily. 12/01/19   Cathie Hoops, Amy V, PA-C  methocarbamol (ROBAXIN) 500 MG tablet Take 1 tablet (500 mg total) by mouth 2 (two) times daily. 12/01/19   Cathie Hoops, Amy V, PA-C  montelukast (SINGULAIR) 10 MG tablet Take 1 tablet (10 mg total) by mouth at bedtime. 12/01/19   Cathie Hoops, Amy V, PA-C  Vitamin D, Ergocalciferol, (DRISDOL) 1.25 MG (50000 UT) CAPS capsule Take 1 capsule (50,000 Units total) by mouth every 7 (seven) days. 01/26/19   Dietrich Pates, PA-C  Family History Family History  Problem Relation Age of Onset  . Cancer Mother   . HIV Mother   . Diabetes Maternal Grandmother   . Diabetes Paternal Grandmother   . Heart failure Father     Social History Social History   Tobacco Use  . Smoking status: Former Smoker    Types: Cigarettes  . Smokeless tobacco: Never Used  Vaping Use  . Vaping Use: Never used  Substance Use Topics  . Alcohol use: No  . Drug use: No     Allergies   Codeine, Peanut-containing drug products, and Shrimp [shellfish allergy]  Review of Systems Review of Systems Pertinent negatives listed in HPI  Physical Exam Triage Vital Signs ED Triage Vitals  Enc Vitals Group     BP 06/08/20 1010 134/84     Pulse Rate 06/08/20 1010 79     Resp 06/08/20 1010 18     Temp 06/08/20 1010 98.5 F (36.9 C)     Temp Source 06/08/20 1010 Oral     SpO2 06/08/20 1010 98 %     Weight --      Height --      Head Circumference --      Peak Flow --      Pain  Score 06/08/20 1011 7     Pain Loc --      Pain Edu? --      Excl. in [place]? --    No data found.  Updated Vital Signs BP 134/84 (BP Location: Left Arm)   Pulse 79   Temp 98.5 F (36.9 C) (Oral)   Resp 18   LMP 05/27/2020   SpO2 98%   Visual Acuity Right Eye Distance:   Left Eye Distance:   Bilateral Distance:    Right Eye Near:   Left Eye Near:    Bilateral Near:     Physical Exam General appearance: alert, well developed, well nourished, cooperative and in no distress Head: Normocephalic, without obvious abnormality, atraumatic ENT: bilateral TM normal, mucosal edema present with congestion, and oropharynx normal Respiratory: Respirations even and unlabored, normal respiratory rate Heart: rate and rhythm normal. No gallop or murmurs noted on exam  Abdomen: BS +, no distention, no rebound tenderness, or no mass Extremities: No gross deformities Skin: Skin color, texture, turgor normal. No rashes seen  Psych: Appropriate mood and affect. UC Treatments / Results  Labs (all labs ordered are listed, but only abnormal results are displayed) Labs Reviewed - No data to display  EKG   Radiology No results found.  Procedures Procedures (including critical care time)  Medications Ordered in UC Medications - No data to display  Initial Impression / Assessment and Plan / UC Course  I have reviewed the triage vital signs and the nursing notes.  Pertinent labs & imaging results that were available during my care of the patient were reviewed by me and considered in my medical decision making (see chart for details).     COVID-19 test pending. Treating for acute sinusitis with Augmentin BID x 10 days Start Cetirizine 10 mg at bedtime. Information provided to establish with Primary Care at [facility].  An After Visit Summary was printed and given to the patient/family. Precautions discussed. Red flags discussed. Questions invited and answered. They voiced understanding and  agreement. Final Clinical Impressions(s) / UC Diagnoses   Final diagnoses:  Acute bacterial sinusitis  Encounter for screening for COVID-19   Discharge Instructions   None    ED Prescriptions  Medication Sig Dispense Auth. Provider   amoxicillin-clavulanate (AUGMENTIN) 875-125 MG tablet Take 1 tablet by mouth 2 (two) times daily. 20 tablet Bing Neighbors, FNP   cetirizine (ZYRTEC) 10 MG tablet Take 1 tablet (10 mg total) by mouth daily. 30 tablet Bing Neighbors, FNP     PDMP not reviewed this encounter.   Bing Neighbors, FNP 06/08/20 1530

## 2020-06-08 NOTE — ED Notes (Signed)
Patient able to ambulate independently  

## 2020-06-09 LAB — SARS-COV-2, NAA 2 DAY TAT

## 2020-06-09 LAB — NOVEL CORONAVIRUS, NAA: SARS-CoV-2, NAA: NOT DETECTED

## 2020-09-23 ENCOUNTER — Other Ambulatory Visit: Payer: Self-pay

## 2020-09-23 ENCOUNTER — Emergency Department (HOSPITAL_COMMUNITY)
Admission: EM | Admit: 2020-09-23 | Discharge: 2020-09-23 | Disposition: A | Discharge status: Home / Self Care | Payer: Medicaid Other | Attending: Emergency Medicine | Admitting: Emergency Medicine

## 2020-09-23 ENCOUNTER — Emergency Department (HOSPITAL_COMMUNITY): Payer: Medicaid Other

## 2020-09-23 ENCOUNTER — Encounter (HOSPITAL_COMMUNITY): Payer: Self-pay

## 2020-09-23 DIAGNOSIS — Z91010 Allergy to peanuts: Secondary | ICD-10-CM | POA: Diagnosis not present

## 2020-09-23 DIAGNOSIS — J069 Acute upper respiratory infection, unspecified: Secondary | ICD-10-CM

## 2020-09-23 DIAGNOSIS — B9789 Other viral agents as the cause of diseases classified elsewhere: Secondary | ICD-10-CM | POA: Diagnosis not present

## 2020-09-23 DIAGNOSIS — Z87891 Personal history of nicotine dependence: Secondary | ICD-10-CM | POA: Insufficient documentation

## 2020-09-23 DIAGNOSIS — R0981 Nasal congestion: Secondary | ICD-10-CM | POA: Diagnosis present

## 2020-09-23 DIAGNOSIS — Z20822 Contact with and (suspected) exposure to covid-19: Secondary | ICD-10-CM | POA: Insufficient documentation

## 2020-09-23 DIAGNOSIS — R059 Cough, unspecified: Secondary | ICD-10-CM | POA: Diagnosis not present

## 2020-09-23 DIAGNOSIS — J45909 Unspecified asthma, uncomplicated: Secondary | ICD-10-CM | POA: Diagnosis not present

## 2020-09-23 LAB — COMPREHENSIVE METABOLIC PANEL
ALT: 17 U/L (ref 0–44)
AST: 16 U/L (ref 15–41)
Albumin: 3.7 g/dL (ref 3.5–5.0)
Alkaline Phosphatase: 71 U/L (ref 38–126)
Anion gap: 4 — ABNORMAL LOW (ref 5–15)
BUN: 10 mg/dL (ref 6–20)
CO2: 27 mmol/L (ref 22–32)
Calcium: 8.7 mg/dL — ABNORMAL LOW (ref 8.9–10.3)
Chloride: 107 mmol/L (ref 98–111)
Creatinine, Ser: 0.6 mg/dL (ref 0.44–1.00)
GFR, Estimated: >60 mL/min (ref >60)
Glucose, Bld: 87 mg/dL (ref 70–99)
Potassium: 3.8 mmol/L (ref 3.5–5.1)
Sodium: 138 mmol/L (ref 135–145)
Total Bilirubin: 0.7 mg/dL (ref 0.3–1.2)
Total Protein: 6.7 g/dL (ref 6.5–8.1)

## 2020-09-23 LAB — CBC WITH DIFFERENTIAL/PLATELET
Abs Immature Granulocytes: 0.01 10*3/uL (ref 0.00–0.07)
Basophils Absolute: 0 10*3/uL (ref 0.0–0.1)
Basophils Relative: 1 %
Eosinophils Absolute: 0.3 10*3/uL (ref 0.0–0.5)
Eosinophils Relative: 4 %
HCT: 31.4 % — ABNORMAL LOW (ref 36.0–46.0)
Hemoglobin: 9.4 g/dL — ABNORMAL LOW (ref 12.0–15.0)
Immature Granulocytes: 0 %
Lymphocytes Relative: 33 %
Lymphs Abs: 2.1 10*3/uL (ref 0.7–4.0)
MCH: 24 pg — ABNORMAL LOW (ref 26.0–34.0)
MCHC: 29.9 g/dL — ABNORMAL LOW (ref 30.0–36.0)
MCV: 80.1 fL (ref 80.0–100.0)
Monocytes Absolute: 0.6 10*3/uL (ref 0.1–1.0)
Monocytes Relative: 9 %
Neutro Abs: 3.5 10*3/uL (ref 1.7–7.7)
Neutrophils Relative %: 53 %
Platelets: 311 10*3/uL (ref 150–400)
RBC: 3.92 MIL/uL (ref 3.87–5.11)
RDW: 18 % — ABNORMAL HIGH (ref 11.5–15.5)
WBC: 6.5 10*3/uL (ref 4.0–10.5)
nRBC: 0 % (ref 0.0–0.2)

## 2020-09-23 LAB — RESPIRATORY PANEL BY RT PCR (FLU A&B, COVID)
Influenza A by PCR: NEGATIVE
Influenza B by PCR: NEGATIVE
SARS Coronavirus 2 by RT PCR: NEGATIVE

## 2020-09-23 NOTE — ED Provider Notes (Signed)
[facility]-EMERGENCY DEPT Provider Note   CSN: [id number] Arrival date & time: 09/23/20  1611     History Chief Complaint  Patient presents with  . Nasal Congestion    [facility] is a 39 y.o. female.  HPI 39 year old female with a history of asthma, obesity, acid reflux, COVID-19 infection in 2020 presents to the ER with complaints of several days of nasal congestion, body aches, headache, dry nonproductive cough.  Patient states she is fully vaccinated 11 September, got the [employer] vaccine.  She feels that she may have Covid again.  She has tried to get tested for Covid but [facility] which [facility] not have Covid test.  She endorses some chest tightness, however this does not feel like her normal asthma.  She denies any wheezing.  Denies any sore throat, nausea, vomiting, abdominal pain, dysuria, pain in taste or smell.    Past Medical History:  Diagnosis Date  . Abnormal Pap smear   . Acid reflux   . Asthma   . Obesity     Patient Active Problem List   Diagnosis Date Noted  . 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 FOOT SURGERY    . TONSILLECTOMY    . TONSILLECTOMY AND ADENOIDECTOMY    . TONSILLECTOMY AND ADENOIDECTOMY    . WISDOM TOOTH EXTRACTION       OB History    Gravida  2   Para  2   Term      Preterm      AB      Living        SAB      TAB      Ectopic      Multiple      Live Births              Family History  Problem Relation Age of Onset  . Cancer Mother   . HIV Mother   . Diabetes Maternal Grandmother   . Diabetes Paternal Grandmother   . Heart failure Father     Social History   Tobacco Use  . Smoking status: Former Smoker    Types: Cigarettes  . Smokeless tobacco: Never Used  Vaping Use  . Vaping Use: Never used  Substance Use Topics  . Alcohol use: No  . Drug use: No    Home  Medications Prior to Admission medications   Medication Sig Start Date End Date Taking? Authorizing Provider  albuterol (PROVENTIL) (2.5 MG/3ML) 0.083% nebulizer solution Take 3 mLs (2.5 mg total) by nebulization every 6 (six) hours as needed for wheezing or shortness of breath. 12/01/19   Cathie Hoops, Amy V, PA-C  albuterol (VENTOLIN HFA) 108 (90 Base) MCG/ACT inhaler Inhale 2 puffs into the lungs every 6 (six) hours as needed for wheezing or shortness of breath. 12/01/19   Cathie Hoops, Amy V, PA-C  amoxicillin-clavulanate (AUGMENTIN) 875-125 MG tablet Take 1 tablet by mouth 2 (two) times daily. 06/08/20   Bing Neighbors, FNP  azelastine (ASTELIN) 0.1 % nasal spray Place 2 sprays into both nostrils 2 (two) times daily. 12/01/19   Cathie Hoops, Amy V, PA-C  cetaphil (CETAPHIL) lotion Apply 1 application topically as needed for dry skin.    [provider]  cetirizine (ZYRTEC) 10 MG tablet Take 1 tablet (10 mg total) by mouth daily. 06/08/20   Bing Neighbors, FNP  fluticasone (FLONASE) 50  MCG/ACT nasal spray Place 2 sprays into both nostrils daily. 12/01/19   Cathie Hoops, Amy V, PA-C  ibuprofen (ADVIL,MOTRIN) 200 MG tablet Take 400 mg by mouth every 6 (six) hours as needed for headache or mild pain.    [provider]  meloxicam (MOBIC) 7.5 MG tablet Take 1 tablet (7.5 mg total) by mouth daily. 12/01/19   Cathie Hoops, Amy V, PA-C  methocarbamol (ROBAXIN) 500 MG tablet Take 1 tablet (500 mg total) by mouth 2 (two) times daily. 12/01/19   Cathie Hoops, Amy V, PA-C  montelukast (SINGULAIR) 10 MG tablet Take 1 tablet (10 mg total) by mouth at bedtime. 12/01/19   Cathie Hoops, Amy V, PA-C  Vitamin D, Ergocalciferol, (DRISDOL) 1.25 MG (50000 UT) CAPS capsule Take 1 capsule (50,000 Units total) by mouth every 7 (seven) days. 01/26/19   Khatri, Hina, PA-C    Allergies    Codeine, Peanut-containing drug products, and Shrimp [shellfish allergy]  Review of Systems   Review of Systems  Constitutional: Negative for chills and fever.  HENT: Negative for ear  pain and sore throat.   Eyes: Negative for pain and visual disturbance.  Respiratory: Positive for cough and chest tightness. Negative for shortness of breath.   Cardiovascular: Negative for chest pain and palpitations.  Gastrointestinal: Negative for abdominal pain and vomiting.  Genitourinary: Negative for dysuria and hematuria.  Musculoskeletal: Negative for arthralgias and back pain.  Skin: Negative for color change and rash.  Neurological: Positive for weakness and headaches. Negative for seizures and syncope.  All other systems reviewed and are negative.   Physical Exam Updated Vital Signs BP (!) 169/85 (BP Location: Left Arm)   Pulse 89   Temp 98.9 F (37.2 C) (Oral)   Resp 16   SpO2 100%   Physical Exam Vitals and nursing note reviewed.  Constitutional:      General: She is not in acute distress.    Appearance: She is well-developed. She is obese. She is not ill-appearing or diaphoretic.  HENT:     Head: Normocephalic and atraumatic.  Eyes:     Conjunctiva/sclera: Conjunctivae normal.  Cardiovascular:     Rate and Rhythm: Normal rate and regular rhythm.     Pulses: Normal pulses.     Heart sounds: No murmur heard.   Pulmonary:     Effort: Pulmonary effort is normal. No respiratory distress.     Breath sounds: Normal breath sounds.  Abdominal:     General: Abdomen is flat.     Palpations: Abdomen is soft.     Tenderness: There is no abdominal tenderness.  Musculoskeletal:        General: No tenderness or signs of injury. Normal range of motion.     Cervical back: Neck supple.  Skin:    General: Skin is warm and dry.  Neurological:     General: No focal deficit present.     Mental Status: She is alert and oriented to person, place, and time.     ED Results / Procedures / Treatments   Labs (all labs ordered are listed, but only abnormal results are displayed) Labs Reviewed  CBC WITH DIFFERENTIAL/PLATELET - Abnormal; Notable for the following components:        Result Value   Hemoglobin 9.4 (*)    HCT 31.4 (*)    MCH 24.0 (*)    MCHC 29.9 (*)    RDW 18.0 (*)    All other components within normal limits  COMPREHENSIVE METABOLIC PANEL - Abnormal; Notable for the  following components:   Calcium 8.7 (*)    Anion gap 4 (*)    All other components within normal limits  RESPIRATORY PANEL BY RT PCR (FLU A&B, COVID)    EKG None  Radiology DG Chest Portable 1 View  Result Date: 09/23/2020 CLINICAL DATA:  Nasal congestion, body aches, headache, cough, patient believes she might have COVID, former smoker EXAM: PORTABLE CHEST 1 VIEW COMPARISON:  Portable exam 1722 hours compared to 05/06/2019 FINDINGS: Normal heart size, mediastinal contours, and pulmonary vascularity. Lungs clear. No acute infiltrate, pleural effusion, or pneumothorax. Osseous structures unremarkable. IMPRESSION: No acute abnormalities. Electronically Signed   By: Ulyses Southward M.D.   On: 09/23/2020 18:06    Procedures Procedures (including critical care time)  Medications Ordered in ED Medications - No data to display  ED Course  I have reviewed the triage vital signs and the nursing notes.  Pertinent labs & imaging results that were available during my care of the patient were reviewed by me and considered in my medical decision making (see chart for details).    MDM Rules/Calculators/A&P                          39 year old female with nasal congestion, cough, chest tightness, overall feeling weak.  She is fully vaccinated for Covid.  Vitals overall reassuring on arrival, though she is slightly hypertensive.  No evidence of hypertensive urgency/emergency plan to check some labs, chest x-ray for pneumonia, Covid test.  Suspect the patient will be stable for discharge.  6:24 PM: I personally reviewed and interpreted her lab work BMP without any significant leukocytosis, hemoglobin of 9.4 which appears to be stable.  Covid test here is negative.  CMP without any significant  electrolyte abnormalities, normal renal and hepatic function.  Chest x-ray reviewed and interpreted by me No significant cardiopulmonary findings  Overall work-up reassuring.  Patient was informed of the reassuring lab work and imaging.  Instructed on supportive care.  Return precautions discussed.  She was understanding and is agreeable.  At this stage in the ED course, the patient is medically screened and stable for discharge. Final Clinical Impression(s) / ED Diagnoses Final diagnoses:  Viral upper respiratory tract infection    Rx / DC Orders ED Discharge Orders    None       Leone Brand 09/23/20 1831    Gerhard Munch, MD 09/23/20 2252

## 2020-09-23 NOTE — ED Triage Notes (Addendum)
Pt c/o nasal congestion, body aches, and headache. Believes she might have COVID, has attempted to get tested with no success.

## 2020-09-23 NOTE — Discharge Instructions (Signed)
Your Covid test today was negative and your workup was very reasurring.  Your symptoms are likely due to a viral respiratory infection.  Please make sure to drink plenty of fluids, get rest.  You may also take Tylenol/ibuprofen, or Flonase or Zyrtec for your symptoms.  Return to the ER for any new or worsening symptoms.

## 2020-09-25 ENCOUNTER — Ambulatory Visit: Payer: Self-pay

## 2020-09-25 ENCOUNTER — Telehealth: Payer: Self-pay

## 2020-09-25 NOTE — Telephone Encounter (Signed)
Transition Care Management Unsuccessful Follow-up Telephone Call  Date of discharge and from where:  [facility]  Attempts:  1st Attempt  Reason for unsuccessful TCM follow-up call:  Unable to leave message

## 2020-09-26 NOTE — Telephone Encounter (Signed)
Transition Care Management Follow-up Telephone Call  Date of discharge and from where:  [facility]/23/[facility]  How have you been since you were released from the hospital? Not doing better.   Any questions or concerns? No  Items Reviewed:  Did the pt receive and understand the discharge instructions provided? Yes   Medications obtained and verified? No   Other? No   Any new allergies since your discharge? No   Dietary orders reviewed? Yes  Do you have support at home? Yes   Home Care and Equipment/Supplies: Were home health services ordered? not applicable If so, what is the name of the agency? na  Has the agency set up a time to come to the patient's home? not applicable Were any new equipment or medical supplies ordered?  No What is the name of the medical supply agency? na Were you able to get the supplies/equipment? not applicable Do you have any questions related to the use of the equipment or supplies? No  Functional Questionnaire: (I = Independent and D = Dependent) ADLs: I  Bathing/Dressing- I  Meal Prep- I  Eating- I  Maintaining continence- I  Transferring/Ambulation- I  Managing Meds- I  Follow up appointments reviewed:   PCP Hospital f/u appt confirmed? Yes  Scheduled to see [facility] provider on 09/27/20 @ 3:30pm.  Specialist Hospital f/u appt confirmed? No    Are transportation arrangements needed? No   If their condition worsens, is the pt aware to call PCP or go to the Emergency Dept.? Yes  Was the patient provided with contact information for the PCP's office or ED? Yes  Was to pt encouraged to call back with questions or concerns? Yes  Patient scheduled to see [facility] provider at [address]. Patient agreed to have PCP appointment set up during check out.

## 2020-09-27 ENCOUNTER — Ambulatory Visit (INDEPENDENT_AMBULATORY_CARE_PROVIDER_SITE_OTHER): Payer: Medicaid Other | Admitting: Nurse Practitioner

## 2020-09-27 VITALS — BP 108/72 | Temp 97.8°F | Ht 62.0 in | Wt 288.0 lb

## 2020-09-27 DIAGNOSIS — Z20822 Contact with and (suspected) exposure to covid-19: Secondary | ICD-10-CM | POA: Diagnosis not present

## 2020-09-27 DIAGNOSIS — J0110 Acute frontal sinusitis, unspecified: Secondary | ICD-10-CM

## 2020-09-27 MED ORDER — PREDNISONE 20 MG PO TABS: 20.0000 mg | ORAL_TABLET | Freq: Every day | ORAL | 0 refills | Status: AC

## 2020-09-27 MED ORDER — AZITHROMYCIN 250 MG PO TABS: ORAL_TABLET | ORAL | 0 refills | Status: DC

## 2020-09-27 MED ORDER — PREDNISONE 20 MG PO TABS: 20.0000 mg | ORAL_TABLET | Freq: Every day | ORAL | 0 refills | Status: DC

## 2020-09-27 NOTE — Progress Notes (Signed)
@Patient  ID: Melissa [facility], female    DOB: Francessca 19, 1982, 39 y.o.   MRN: [id number]  Chief Complaint  Patient presents with  . New Patient (Initial Visit)    ED follow up, COVID negative. Was told she has upper respiratory tract infection. Breathing issues, frontal headache, feels drainage in throat, left ear pain    Referring provider: No ref. provider found   39 year old female with history of asthma, GERD, obesity.  HPI   Patient presents to clinic today for ED follow-up.  She was seen in the ED on 09/23/2020.  She was tested for Covid but did come back negative.  Patient states that she still continues to have shortness of breath, sinus headache/pressure, postnasal drip, left ear pain.  Patient reports altered taste and smell.  We discussed that we will retest for Covid today.  Patient does have a history of asthma.Denies f/c/s, n/v/d, hemoptysis, PND, chest pain or edema.        Allergies  Allergen Reactions  . Codeine Anaphylaxis and Other (See Comments)    Patient was hospitalized   . Peanut-Containing Drug Products Hives and Itching  . Shrimp [Shellfish Allergy]      There is no immunization history on file for this patient.  Past Medical History:  Diagnosis Date  . Abnormal Pap smear   . Acid reflux   . Asthma   . Obesity     Tobacco History: Social History   Tobacco Use  Smoking Status Former Smoker  . Types: Cigarettes  Smokeless Tobacco Never Used   Counseling given: Not Answered   Outpatient Encounter Medications as of 09/27/2020  Medication Sig  . albuterol (VENTOLIN HFA) 108 (90 Base) MCG/ACT inhaler Inhale 2 puffs into the lungs every 6 (six) hours as needed for wheezing or shortness of breath.  13/08/2020 azelastine (ASTELIN) 0.1 % nasal spray Place 2 sprays into both nostrils 2 (two) times daily.  . cetaphil (CETAPHIL) lotion Apply 1 application topically as needed for dry skin.  . cetirizine (ZYRTEC) 10 MG tablet Take 1 tablet (10 mg total) by mouth  daily.  . fluticasone (FLONASE) 50 MCG/ACT nasal spray Place 2 sprays into both nostrils daily.  Marland Kitchen ibuprofen (ADVIL,MOTRIN) 200 MG tablet Take 400 mg by mouth every 6 (six) hours as needed for headache or mild pain.  Marland Kitchen albuterol (PROVENTIL) (2.5 MG/3ML) 0.083% nebulizer solution Take 3 mLs (2.5 mg total) by nebulization every 6 (six) hours as needed for wheezing or shortness of breath. (Patient not taking: Reported on 09/27/2020)  . meloxicam (MOBIC) 7.5 MG tablet Take 1 tablet (7.5 mg total) by mouth daily. (Patient not taking: Reported on 09/27/2020)  . methocarbamol (ROBAXIN) 500 MG tablet Take 1 tablet (500 mg total) by mouth 2 (two) times daily. (Patient not taking: Reported on 09/27/2020)  . montelukast (SINGULAIR) 10 MG tablet Take 1 tablet (10 mg total) by mouth at bedtime. (Patient not taking: Reported on 09/27/2020)  . Vitamin D, Ergocalciferol, (DRISDOL) 1.25 MG (50000 UT) CAPS capsule Take 1 capsule (50,000 Units total) by mouth every 7 (seven) days. (Patient not taking: Reported on 09/27/2020)  . [DISCONTINUED] amoxicillin-clavulanate (AUGMENTIN) 875-125 MG tablet Take 1 tablet by mouth 2 (two) times daily.   No facility-administered encounter medications on file as of 09/27/2020.     Review of Systems  Review of Systems  Constitutional: Negative.  Negative for fatigue and fever.  HENT: Positive for congestion, postnasal drip and sinus pressure.   Respiratory: Positive for shortness of breath. Negative  for cough.   Cardiovascular: Negative.  Negative for chest pain, palpitations and leg swelling.  Gastrointestinal: Negative.   Allergic/Immunologic: Negative.   Neurological: Negative.   Psychiatric/Behavioral: Negative.        Physical Exam  BP 108/72 (BP Location: Left Arm)   Temp 97.8 F (36.6 C)   Ht 5\' 2"  (1.575 m)   Wt 288 lb (130.6 kg)   LMP 09/20/2020   BMI 52.68 kg/m   Wt Readings from Last 5 Encounters:  09/27/20 288 lb (130.6 kg)  10/29/19 289 lb (131.1  kg)  05/06/19 270 lb (122.5 kg)  02/06/19 291 lb 0.1 oz (132 kg)  01/26/19 292 lb (132.5 kg)     Physical Exam Vitals and nursing note reviewed.  Constitutional:      General: She is not in acute distress.    Appearance: She is well-developed.  Cardiovascular:     Rate and Rhythm: Normal rate and regular rhythm.  Pulmonary:     Effort: Pulmonary effort is normal.     Breath sounds: Normal breath sounds.  Musculoskeletal:     Right lower leg: No edema.     Left lower leg: No edema.  Neurological:     Mental Status: She is alert and oriented to person, place, and time.  Psychiatric:        Mood and Affect: Mood normal.        Behavior: Behavior normal.        Imaging: DG Chest Portable 1 View  Result Date: 09/23/2020 CLINICAL DATA:  Nasal congestion, body aches, headache, cough, patient believes she might have COVID, former smoker EXAM: PORTABLE CHEST 1 VIEW COMPARISON:  Portable exam 1722 hours compared to 05/06/2019 FINDINGS: Normal heart size, mediastinal contours, and pulmonary vascularity. Lungs clear. No acute infiltrate, pleural effusion, or pneumothorax. Osseous structures unremarkable. IMPRESSION: No acute abnormalities. Electronically Signed   By: 05/08/2019 M.D.   On: 09/23/2020 18:06     Assessment & Plan:   No problem-specific Assessment & Plan notes found for this encounter.     13/04/2020, NP 09/27/2020

## 2020-09-27 NOTE — Patient Instructions (Addendum)
Sinusitis:  Stay well hydrated  Stay active  Deep breathing exercises  May take tylenol or fever or pain  May take mucinex twice daily  Will order prednisone  Will order azithromycin    Follow up:  Follow up if needed

## 2020-09-28 ENCOUNTER — Encounter: Payer: Self-pay | Admitting: Nurse Practitioner

## 2020-09-28 DIAGNOSIS — J0110 Acute frontal sinusitis, unspecified: Secondary | ICD-10-CM | POA: Insufficient documentation

## 2020-09-28 DIAGNOSIS — Z20822 Contact with and (suspected) exposure to covid-19: Secondary | ICD-10-CM | POA: Insufficient documentation

## 2020-09-28 LAB — NOVEL CORONAVIRUS, NAA: SARS-CoV-2, NAA: NOT DETECTED

## 2020-09-28 LAB — SARS-COV-2, NAA 2 DAY TAT

## 2020-09-28 NOTE — Assessment & Plan Note (Signed)
Sinusitis:  Stay well hydrated  Stay active  Deep breathing exercises  May take tylenol or fever or pain  May take mucinex twice daily  Will order prednisone  Will order azithromycin    Follow up:  Follow up if needed   

## 2020-10-20 ENCOUNTER — Other Ambulatory Visit: Payer: Self-pay

## 2020-10-20 ENCOUNTER — Ambulatory Visit (INDEPENDENT_AMBULATORY_CARE_PROVIDER_SITE_OTHER): Payer: Medicaid Other | Admitting: Nurse Practitioner

## 2020-10-20 ENCOUNTER — Encounter: Payer: Self-pay | Admitting: Nurse Practitioner

## 2020-10-20 VITALS — BP 125/62 | HR 67 | Temp 98.4°F | Resp 18 | Ht 64.0 in | Wt 289.4 lb

## 2020-10-20 DIAGNOSIS — Z13220 Encounter for screening for lipoid disorders: Secondary | ICD-10-CM | POA: Diagnosis not present

## 2020-10-20 DIAGNOSIS — Z6841 Body Mass Index (BMI) 40.0 and over, adult: Secondary | ICD-10-CM | POA: Diagnosis not present

## 2020-10-20 DIAGNOSIS — E66813 Obesity, class 3: Secondary | ICD-10-CM

## 2020-10-20 DIAGNOSIS — Z131 Encounter for screening for diabetes mellitus: Secondary | ICD-10-CM | POA: Diagnosis not present

## 2020-10-20 DIAGNOSIS — Z7689 Persons encountering health services in other specified circumstances: Secondary | ICD-10-CM | POA: Diagnosis not present

## 2020-10-20 DIAGNOSIS — Z1389 Encounter for screening for other disorder: Secondary | ICD-10-CM | POA: Diagnosis not present

## 2020-10-20 DIAGNOSIS — Z1321 Encounter for screening for nutritional disorder: Secondary | ICD-10-CM | POA: Diagnosis not present

## 2020-10-20 DIAGNOSIS — Z130 Encounter for screening for diseases of the blood and blood-forming organs and certain disorders involving the immune mechanism: Secondary | ICD-10-CM | POA: Diagnosis not present

## 2020-10-20 LAB — POCT URINALYSIS DIP (CLINITEK)
Bilirubin, UA: NEGATIVE
Glucose, UA: NEGATIVE mg/dL
Ketones, POC UA: NEGATIVE mg/dL
Leukocytes, UA: NEGATIVE
Nitrite, UA: NEGATIVE
POC PROTEIN,UA: 30 — AB
Spec Grav, UA: >=1.03 — AB (ref 1.010–1.025)
Urobilinogen, UA: 0.2 E.U./dL
pH, UA: 6.5 (ref 5.0–8.0)

## 2020-10-20 LAB — POCT GLYCOSYLATED HEMOGLOBIN (HGB A1C): Hemoglobin A1C: 5.3 % (ref 4.0–5.6)

## 2020-10-20 NOTE — Patient Instructions (Signed)
Health Maintenance, Female Adopting a healthy lifestyle and getting preventive care are important in promoting health and wellness. Ask your health care provider about:  The right schedule for you to have regular tests and exams.  Things you can do on your own to prevent diseases and keep yourself healthy. What should I know about diet, weight, and exercise? Eat a healthy diet   Eat a diet that includes plenty of vegetables, fruits, low-fat dairy products, and lean protein.  Do not eat a lot of foods that are high in solid fats, added sugars, or sodium. Maintain a healthy weight Body mass index (BMI) is used to identify weight problems. It estimates body fat based on height and weight. Your health care provider can help determine your BMI and help you achieve or maintain a healthy weight. Get regular exercise Get regular exercise. This is one of the most important things you can do for your health. Most adults should:  Exercise for at least 150 minutes each week. The exercise should increase your heart rate and make you sweat (moderate-intensity exercise).  Do strengthening exercises at least twice a week. This is in addition to the moderate-intensity exercise.  Spend less time sitting. Even light physical activity can be beneficial. Watch cholesterol and blood lipids Have your blood tested for lipids and cholesterol at 39 years of age, then have this test every 5 years. Have your cholesterol levels checked more often if:  Your lipid or cholesterol levels are high.  You are older than 40 years of age.  You are at high risk for heart disease. What should I know about cancer screening? Depending on your health history and family history, you may need to have cancer screening at various ages. This may include screening for:  Breast cancer.  Cervical cancer.  Colorectal cancer.  Skin cancer.  Lung cancer. What should I know about heart disease, diabetes, and high blood  pressure? Blood pressure and heart disease  High blood pressure causes heart disease and increases the risk of stroke. This is more likely to develop in people who have high blood pressure readings, are of African descent, or are overweight.  Have your blood pressure checked: ? Every 3-5 years if you are 18-39 years of age. ? Every year if you are 40 years old or older. Diabetes Have regular diabetes screenings. This checks your fasting blood sugar level. Have the screening done:  Once every three years after age 40 if you are at a normal weight and have a low risk for diabetes.  More often and at a younger age if you are overweight or have a high risk for diabetes. What should I know about preventing infection? Hepatitis B If you have a higher risk for hepatitis B, you should be screened for this virus. Talk with your health care provider to find out if you are at risk for hepatitis B infection. Hepatitis C Testing is recommended for:  Everyone born from 1945 through 1965.  Anyone with known risk factors for hepatitis C. Sexually transmitted infections (STIs)  Get screened for STIs, including gonorrhea and chlamydia, if: ? You are sexually active and are younger than 39 years of age. ? You are older than 39 years of age and your health care provider tells you that you are at risk for this type of infection. ? Your sexual activity has changed since you were last screened, and you are at increased risk for chlamydia or gonorrhea. Ask your health care provider if   you are at risk.  Ask your health care provider about whether you are at high risk for HIV. Your health care provider may recommend a prescription medicine to help prevent HIV infection. If you choose to take medicine to prevent HIV, you should first get tested for HIV. You should then be tested every 3 months for as long as you are taking the medicine. Pregnancy  If you are about to stop having your period (premenopausal) and  you may become pregnant, seek counseling before you get pregnant.  Take 400 to 800 micrograms (mcg) of folic acid every day if you become pregnant.  Ask for birth control (contraception) if you want to prevent pregnancy. Osteoporosis and menopause Osteoporosis is a disease in which the bones lose minerals and strength with aging. This can result in bone fractures. If you are 65 years old or older, or if you are at risk for osteoporosis and fractures, ask your health care provider if you should:  Be screened for bone loss.  Take a calcium or vitamin D supplement to lower your risk of fractures.  Be given hormone replacement therapy (HRT) to treat symptoms of menopause. Follow these instructions at home: Lifestyle  Do not use any products that contain nicotine or tobacco, such as cigarettes, e-cigarettes, and chewing tobacco. If you need help quitting, ask your health care provider.  Do not use street drugs.  Do not share needles.  Ask your health care provider for help if you need support or information about quitting drugs. Alcohol use  Do not drink alcohol if: ? Your health care provider tells you not to drink. ? You are pregnant, may be pregnant, or are planning to become pregnant.  If you drink alcohol: ? Limit how much you use to 0-1 drink a day. ? Limit intake if you are breastfeeding.  Be aware of how much alcohol is in your drink. In the [place] one drink equals one 12 oz bottle of beer (355 mL), one 5 oz glass of wine (148 mL), or one 1 oz glass of hard liquor (44 mL). General instructions  Schedule regular health, dental, and eye exams.  Stay current with your vaccines.  Tell your health care provider if: ? You often feel depressed. ? You have ever been abused or do not feel safe at home. Summary  Adopting a healthy lifestyle and getting preventive care are important in promoting health and wellness.  Follow your health care provider's instructions about healthy  diet, exercising, and getting tested or screened for diseases.  Follow your health care provider's instructions on monitoring your cholesterol and blood pressure. This information is not intended to replace advice given to you by your health care provider. Make sure you discuss any questions you have with your health care provider. Document Revised: 10/28/2018 Document Reviewed: 10/28/2018 Elsevier Patient Education  2020 [employer]  

## 2020-10-20 NOTE — Progress Notes (Signed)
vbNeeds RF for albuterol neb solution, Singulair and vitamin D

## 2020-10-20 NOTE — Progress Notes (Signed)
New Patient Office Visit  Subjective:  Patient ID: Melissa Guerra, female    DOB: 12/15/1980  Age: 39 y.o. MRN: [id number]  CC:  Chief Complaint  Patient presents with  . Establish Care    HPI Melissa Guerra presents to establish care. She  has a past medical history of Abnormal Pap smear, Acid reflux, Asthma, and Obesity.   She has a personal history of COV-19 in December 2020. She does not feel like she will ever be herself. She did have an recent URI.   She has multiple concerns per ROS.  On 9 Past Medical History:  Diagnosis Date  . Abnormal Pap smear   . Acid reflux   . Asthma   . Obesity     Past Surgical History:  Procedure Laterality Date  . FOOT SURGERY    . TONSILLECTOMY    . TONSILLECTOMY AND ADENOIDECTOMY    . TONSILLECTOMY AND ADENOIDECTOMY    . WISDOM TOOTH EXTRACTION      Family History  Problem Relation Age of Onset  . Cancer Mother   . HIV Mother   . Diabetes Maternal Grandmother   . Diabetes Paternal Grandmother   . Heart failure Father     Social History   Socioeconomic History  . Marital status: Single    Spouse name: Not on file  . Number of children: Not on file  . Years of education: Not on file  . Highest education level: Not on file  Occupational History  . Not on file  Tobacco Use  . Smoking status: Former Smoker    Types: Cigarettes  . Smokeless tobacco: Never Used  Vaping Use  . Vaping Use: Never used  Substance and Sexual Activity  . Alcohol use: No  . Drug use: No  . Sexual activity: Yes    Birth control/protection: Other-see comments    Comment: female partner  Other Topics Concern  . Not on file  Social History Narrative  . Not on file   Social Determinants of Health   Financial Resource Strain: Not on file  Food Insecurity: Not on file  Transportation Needs: Not on file  Physical Activity: Not on file  Stress: Not on file  Social Connections: Not on file  Intimate Partner Violence: Not on file     ROS Review of Systems  HENT: Positive for nosebleeds (only with blowing her nose).   Eyes: Negative.   Respiratory: Positive for shortness of breath.   Cardiovascular:       Chest pressure  Endocrine: Negative.   Genitourinary: Positive for flank pain (question kidney stones). Negative for dysuria and hematuria.  Musculoskeletal: Positive for myalgias.       Hx of left foot fracture  Skin: Negative.     Objective:   Today's Vitals: BP 125/62 (BP Location: Right Arm, Patient Position: Sitting, Cuff Size: Large)   Pulse 67   Temp 98.4 F (36.9 C) (Temporal)   Resp 18   Ht 5\' 4"  (1.626 m)   Wt 289 lb 6.4 oz (131.3 kg)   LMP 09/20/2020   SpO2 100%   BMI 49.68 kg/m   Physical Exam Constitutional:      General: She is not in acute distress.    Appearance: She is obese. She is not ill-appearing, toxic-appearing or diaphoretic.  HENT:     Head: Normocephalic and atraumatic.     Right Ear: Tympanic membrane normal.     Left Ear: Tympanic membrane normal.  Nose: Nose normal.     Mouth/Throat:     Mouth: Mucous membranes are moist.  Cardiovascular:     Rate and Rhythm: Normal rate and regular rhythm.     Pulses: Normal pulses.     Heart sounds: Normal heart sounds.  Pulmonary:     Effort: Pulmonary effort is normal.     Breath sounds: Normal breath sounds.  Abdominal:     General: Abdomen is flat. Bowel sounds are normal.     Palpations: Abdomen is soft.  Musculoskeletal:        General: Normal range of motion.     Cervical back: Normal range of motion.  Skin:    General: Skin is warm and dry.  Neurological:     General: No focal deficit present.     Mental Status: She is alert and oriented to person, place, and time.  Psychiatric:        Mood and Affect: Mood normal.        Behavior: Behavior normal.        Thought Content: Thought content normal.        Judgment: Judgment normal.     Assessment & Plan:   Problem List Items Addressed This Visit    None   Visit Diagnoses    Encounter to establish care    -  Primary   Relevant Orders   POCT URINALYSIS DIP (CLINITEK) (Completed)   HgB A1c (Completed)   Screening for cholesterol level       Relevant Orders   Lipid panel   Screening for diabetes mellitus (DM)       Relevant Orders   Comp. Metabolic Panel (12) (Completed)   Screening for obesity       Relevant Orders   TSH (Completed)   Screening for iron deficiency anemia       Relevant Orders   CBC with Differential/Platelet (Completed)   Iron, TIBC and Ferritin Panel (Completed)   Encounter for vitamin deficiency screening       Relevant Orders   VITAMIN D 25 Hydroxy (Vit-D Deficiency, Fractures) (Completed)   Class 3 severe obesity due to excess calories without serious comorbidity with body mass index (BMI) of 45.0 to 49.9 in adult [facility])          Outpatient Encounter Medications as of 10/20/2020  Medication Sig  . albuterol (VENTOLIN HFA) 108 (90 Base) MCG/ACT inhaler Inhale 2 puffs into the lungs every 6 (six) hours as needed for wheezing or shortness of breath.  . cetirizine (ZYRTEC) 10 MG tablet Take 1 tablet (10 mg total) by mouth daily.  Marland Kitchen ibuprofen (ADVIL,MOTRIN) 200 MG tablet Take 400 mg by mouth every 6 (six) hours as needed for headache or mild pain.  . [DISCONTINUED] fluticasone (FLONASE) 50 MCG/ACT nasal spray Place 2 sprays into both nostrils daily.  Marland Kitchen azelastine (ASTELIN) 0.1 % nasal spray Place 2 sprays into both nostrils 2 (two) times daily. (Patient not taking: Reported on 10/20/2020)  . Cholecalciferol 1.25 MG (50000 UT) capsule cholecalciferol (vitamin D3) 1,250 mcg (50,000 unit) capsule  TK 1 C PO ONCE WEEKLY  . [DISCONTINUED] albuterol (PROVENTIL) (2.5 MG/3ML) 0.083% nebulizer solution Take 3 mLs (2.5 mg total) by nebulization every 6 (six) hours as needed for wheezing or shortness of breath. (Patient not taking: Reported on 09/27/2020)  . [DISCONTINUED] azithromycin (ZITHROMAX) 250 MG tablet Take 2  tablets (500 mg) on day 1, then take 1 tablet (250 mg) on days 2-5 (Patient not taking: Reported on  10/20/2020)  . [DISCONTINUED] cetaphil (CETAPHIL) lotion Apply 1 application topically as needed for dry skin. (Patient not taking: Reported on 10/20/2020)  . [DISCONTINUED] meloxicam (MOBIC) 7.5 MG tablet Take 1 tablet (7.5 mg total) by mouth daily. (Patient not taking: Reported on 10/20/2020)  . [DISCONTINUED] methocarbamol (ROBAXIN) 500 MG tablet Take 1 tablet (500 mg total) by mouth 2 (two) times daily. (Patient not taking: Reported on 09/27/2020)  . [DISCONTINUED] montelukast (SINGULAIR) 10 MG tablet Take 1 tablet (10 mg total) by mouth at bedtime. (Patient not taking: Reported on 09/27/2020)  . [DISCONTINUED] Vitamin D, Ergocalciferol, (DRISDOL) 1.25 MG (50000 UT) CAPS capsule Take 1 capsule (50,000 Units total) by mouth every 7 (seven) days. (Patient not taking: Reported on 09/27/2020)   No facility-administered encounter medications on file as of 10/20/2020.    Follow-up: Return for follow up as needed.   Barbette Merino, NP

## 2020-10-21 LAB — CBC WITH DIFFERENTIAL/PLATELET
Basophils Absolute: 0.1 10*3/uL (ref 0.0–0.2)
Basos: 1 %
EOS (ABSOLUTE): 0.2 10*3/uL (ref 0.0–0.4)
Eos: 4 %
Hematocrit: 29.8 % — ABNORMAL LOW (ref 34.0–46.6)
Hemoglobin: 9.3 g/dL — ABNORMAL LOW (ref 11.1–15.9)
Immature Grans (Abs): 0 10*3/uL (ref 0.0–0.1)
Immature Granulocytes: 0 %
Lymphocytes Absolute: 2.2 10*3/uL (ref 0.7–3.1)
Lymphs: 38 %
MCH: 23.8 pg — ABNORMAL LOW (ref 26.6–33.0)
MCHC: 31.2 g/dL — ABNORMAL LOW (ref 31.5–35.7)
MCV: 76 fL — ABNORMAL LOW (ref 79–97)
Monocytes Absolute: 0.5 10*3/uL (ref 0.1–0.9)
Monocytes: 9 %
Neutrophils Absolute: 2.8 10*3/uL (ref 1.4–7.0)
Neutrophils: 48 %
Platelets: 337 10*3/uL (ref 150–450)
RBC: 3.9 x10E6/uL (ref 3.77–5.28)
RDW: 16.3 % — ABNORMAL HIGH (ref 11.7–15.4)
WBC: 5.7 10*3/uL (ref 3.4–10.8)

## 2020-10-21 LAB — COMP. METABOLIC PANEL (12)
AST: 18 IU/L (ref 0–40)
Albumin/Globulin Ratio: 1.7 (ref 1.2–2.2)
Albumin: 4.2 g/dL (ref 3.8–4.8)
Alkaline Phosphatase: 83 IU/L (ref 44–121)
BUN/Creatinine Ratio: 15 (ref 9–23)
BUN: 10 mg/dL (ref 6–20)
Bilirubin Total: 0.6 mg/dL (ref 0.0–1.2)
Calcium: 8.8 mg/dL (ref 8.7–10.2)
Chloride: 106 mmol/L (ref 96–106)
Creatinine, Ser: 0.66 mg/dL (ref 0.57–1.00)
GFR calc Af Amer: 129 mL/min/{1.73_m2} (ref >59)
GFR calc non Af Amer: 112 mL/min/{1.73_m2} (ref >59)
Globulin, Total: 2.5 g/dL (ref 1.5–4.5)
Glucose: 77 mg/dL (ref 65–99)
Potassium: 3.8 mmol/L (ref 3.5–5.2)
Sodium: 141 mmol/L (ref 134–144)
Total Protein: 6.7 g/dL (ref 6.0–8.5)

## 2020-10-21 LAB — IRON,TIBC AND FERRITIN PANEL
Ferritin: 7 ng/mL — ABNORMAL LOW (ref 15–150)
Iron Saturation: 6 % — CL (ref 15–55)
Iron: 25 ug/dL — ABNORMAL LOW (ref 27–159)
Total Iron Binding Capacity: 389 ug/dL (ref 250–450)
UIBC: 364 ug/dL (ref 131–425)

## 2020-10-21 LAB — VITAMIN D 25 HYDROXY (VIT D DEFICIENCY, FRACTURES): Vit D, 25-Hydroxy: 26.9 ng/mL — ABNORMAL LOW (ref 30.0–100.0)

## 2020-10-21 LAB — TSH: TSH: 1.51 u[IU]/mL (ref 0.450–4.500)

## 2020-10-23 ENCOUNTER — Telehealth: Payer: Self-pay | Admitting: Nurse Practitioner

## 2020-10-23 ENCOUNTER — Other Ambulatory Visit: Payer: Self-pay | Admitting: Nurse Practitioner

## 2020-10-23 MED ORDER — FLUTICASONE PROPIONATE 50 MCG/ACT NA SUSP: 2.0000 | Freq: Every day | NASAL | 5 refills | Status: AC

## 2020-10-23 MED ORDER — MONTELUKAST SODIUM 10 MG PO TABS: 10.0000 mg | ORAL_TABLET | Freq: Every day | ORAL | 11 refills | Status: DC

## 2020-10-23 NOTE — Telephone Encounter (Signed)
No vitamin D will be sent until labs have resulted others sent

## 2020-10-26 ENCOUNTER — Other Ambulatory Visit: Payer: Self-pay | Admitting: Nurse Practitioner

## 2020-10-26 MED ORDER — FERROUS FUMARATE 325 (106 FE) MG PO TABS: 1.0000 | ORAL_TABLET | Freq: Two times a day (BID) | ORAL | 3 refills | Status: DC

## 2020-11-07 ENCOUNTER — Other Ambulatory Visit: Payer: Self-pay

## 2020-11-07 MED ORDER — FERROUS FUMARATE 325 (106 FE) MG PO TABS: 1.0000 | ORAL_TABLET | Freq: Two times a day (BID) | ORAL | 3 refills | Status: DC

## 2020-11-07 NOTE — Telephone Encounter (Signed)
Called and spoke with pt , she would like it sent to [facility] or to [facility] or [facility] . I told I would to see which has this medication for her I will send her a My Chart Message to inform her which pharmacy has her medication.

## 2020-11-09 ENCOUNTER — Other Ambulatory Visit: Payer: Self-pay | Admitting: Nurse Practitioner

## 2020-11-09 MED ORDER — FERROUS GLUCONATE 324 (38 FE) MG PO TABS: 324.0000 mg | ORAL_TABLET | Freq: Every day | ORAL | 3 refills | Status: DC

## 2020-11-16 NOTE — Telephone Encounter (Signed)
Called spoke with pt on the phone she said that she called to several place to get tested. However wasn't able find any where. Pt said that she is having symptoms , headache, fatigue , congestion. Pt said that she is taking otc  congestion  medication. Gave her the to [facility] testing center to see if she can sooner appt. Advised pt to treat her symptoms and stay hydrated until she able to get tested.

## 2020-11-18 DIAGNOSIS — Z1152 Encounter for screening for COVID-19: Secondary | ICD-10-CM | POA: Diagnosis not present

## 2021-01-18 ENCOUNTER — Ambulatory Visit: Payer: Medicaid Other | Admitting: Nurse Practitioner

## 2021-01-29 ENCOUNTER — Encounter: Payer: Self-pay | Admitting: Nurse Practitioner

## 2021-01-29 ENCOUNTER — Ambulatory Visit: Payer: Medicaid Other | Attending: Internal Medicine

## 2021-01-29 ENCOUNTER — Other Ambulatory Visit: Payer: Self-pay

## 2021-01-29 ENCOUNTER — Other Ambulatory Visit: Payer: Self-pay | Admitting: Internal Medicine

## 2021-01-29 ENCOUNTER — Ambulatory Visit (INDEPENDENT_AMBULATORY_CARE_PROVIDER_SITE_OTHER): Payer: BLUE CROSS/BLUE SHIELD | Admitting: Nurse Practitioner

## 2021-01-29 VITALS — BP 128/52 | HR 90 | Wt 281.0 lb

## 2021-01-29 DIAGNOSIS — Z8616 Personal history of COVID-19: Secondary | ICD-10-CM | POA: Diagnosis not present

## 2021-01-29 DIAGNOSIS — Z20822 Contact with and (suspected) exposure to covid-19: Secondary | ICD-10-CM

## 2021-01-29 DIAGNOSIS — J069 Acute upper respiratory infection, unspecified: Secondary | ICD-10-CM | POA: Diagnosis not present

## 2021-01-29 DIAGNOSIS — R0789 Other chest pain: Secondary | ICD-10-CM | POA: Diagnosis not present

## 2021-01-29 DIAGNOSIS — N926 Irregular menstruation, unspecified: Secondary | ICD-10-CM

## 2021-01-29 MED ORDER — FLUCONAZOLE 150 MG PO TABS: 150.0000 mg | ORAL_TABLET | Freq: Once | ORAL | 0 refills | Status: AC

## 2021-01-29 MED ORDER — PSEUDOEPHEDRINE-GUAIFENESIN ER 60-600 MG PO TB12: 1.0000 | ORAL_TABLET | Freq: Two times a day (BID) | ORAL | 0 refills | Status: AC

## 2021-01-29 MED ORDER — AMOXICILLIN-POT CLAVULANATE 875-125 MG PO TABS: 1.0000 | ORAL_TABLET | Freq: Two times a day (BID) | ORAL | 0 refills | Status: AC

## 2021-01-29 NOTE — Patient Instructions (Addendum)
3 Key Steps to Take While Waiting for Your COVID-19 Test Result To help stop the spread of COVID-19, take these 3 key steps NOW while waiting for your test results: 1. Stay home and monitor your health. Stay home and monitor your health to help protect your friends, family, and others from possibly getting COVID-19 from you. Stay home and away from others:  If possible, stay away from others, especially people who are at higher risk for getting very sick from COVID-19, such as older adults and people with other medical conditions.  If you have been in contact with someone with COVID-19, stay home and away from others for 14 days after your last contact with that person. Follow the recommendations of your local public health department if you need to quarantine.  If you have a fever, cough or other symptoms of COVID-19, stay home and away from others (except to get medical care). Monitor your health:  Watch for fever, cough, shortness of breath, or other symptoms of COVID-19. Remember, symptoms may appear 2-14 days after exposure to COVID-19 and can include: ? Fever or chills ? Cough ? Shortness of breath or difficulty breathing ? Tiredness ? Muscle or body aches ? Headache ? New loss of taste or smell ? Sore throat ? Congestion or runny nose ? Nausea or vomiting ? Diarrhea 2. Think about the people you have recently been around. If you are diagnosed with COVID-19, a public health worker may call you to check on your health, discuss who you have been around, and ask where you spent time while you may have been able to spread COVID-19 to others. While you wait for your COVID-19 test result, think about everyone you have been around recently. This will be important information to give health workers if your test is positive.  Complete the information on the back of this page to help you remember everyone you have been around.  3. Answer the phone call from the health department. If a public  health worker calls you, answer the call to help slow the spread of COVID-19 in your community.  Discussions with health department staff are confidential. This means that your personal and medical information will be kept private and only shared with those who may need to know, like your health care provider.  Your name will not be shared with those you came in contact with. The health department will only notify people you were in close contact with (within 6 feet for more than 15 minutes) that they might have been exposed to COVID-19. Think about the people you have recently been around If you test positive and are diagnosed with COVID-19, someone from the health department may call to check-in on your health, discuss who you have been around, and ask where you spent time while you may have been able to spread COVID-19 to others. This form can help you think about people you have recently been around so you will be ready if a public health worker calls you. Things to think about. Have you:  Gone to work or school?  Gotten together with others (eaten out at a restaurant, gone out for drinks, exercised with others or gone to a gym, had friends or family over to your house, volunteered, gone to a party, pool, or park)?  Gone to a store in person (e.g., grocery store, mall)?  Gone to in-person appointments (e.g., salon, barber, doctor's or dentist's office)?  Ridden in a car with others (e.g., rideshare) or   taken public transportation?  Been inside a church, synagogue, mosque or other places of worship? Who lives with you?  ______________________________________________________________________  ______________________________________________________________________  ______________________________________________________________________  ______________________________________________________________________ Who have you been around (less than 6 feet for a total of 15 minutes or more) in the  last 10 days? (You may have more people to list than the space provided. If so, write on the front of this sheet or a separate piece of paper.) Name ______________________________________________  Phone number ____________________________________  Date you last saw them _____________________________  Where you last saw them ________________________________________________ Name ______________________________________________  Phone number ____________________________________  Date you last saw them _____________________________  Where you last saw them ________________________________________________ Name ______________________________________________  Phone number ____________________________________  Date you last saw them _____________________________  Where you last saw them ________________________________________________ Name ______________________________________________  Phone number ____________________________________  Date you last saw them _____________________________  Where you last saw them ________________________________________________ Name ______________________________________________  Phone number ____________________________________  Date you last saw them _____________________________  Where you last saw them ________________________________________________ What have you done in the last 10 days with other people? Activity _____________________________________________  Location _________________________________________  Date ____________________________________________ Activity _____________________________________________  Location _________________________________________  Date ____________________________________________ Activity _____________________________________________  Location _________________________________________  Date ____________________________________________ Activity _____________________________________________  Location  _________________________________________  Date ____________________________________________ Activity _____________________________________________  Location _________________________________________  Date ____________________________________________ Centers for Disease Control and Prevention cdc.gov/coronavirus 05/26/2020 This information is not intended to replace advice given to you by your health care provider. Make sure you discuss any questions you have with your health care provider. Document Revised: 09/18/2020 Document Reviewed: 09/18/2020 Elsevier Patient Education  2021 Elsevier Inc.  

## 2021-01-29 NOTE — Progress Notes (Signed)
   [facility] Patient [facility] [address] [place], [place]  [address] Phone:  [phone]   Fax:  [phone] Virtual Visit via Telephone Note  I connected with Melissa Guerra on 01/30/21 at 11:00 AM EDT by telephone and verified that I am speaking with the correct person using two identifiers.   I discussed the limitations, risks, security and privacy concerns of performing an evaluation and management service by telephone and the availability of in person appointments. I also discussed with the patient that there may be a patient responsible charge related to this service. The patient expressed understanding and agreed to proceed.  Patient home Provider Office  History of Present Illness:  Upper Respiratory Infection Patient complains of symptoms of a URI. Symptoms include nasal congestion, post nasal drip, productive cough with  thick colored sputum, sinus pressure, sneezing and fatigue. Onset of symptoms was several days ago, and has been gradually worsening since that time. Treatment to date: antihistamines, decongestants and nasal steroids. She feels like she has heaviness in her chest. She was last tested in December.  She is having on an off sharp pain in her vagina. She is concern about fibroids. She is tired. She has had a change in her menstrual cycle.  She admits to having increased cramping and blood clots. The blood flow has increased. She Is having to use a tampon and pad every 90 minutes.   Observations/Objective: Able to speak in full sentences with no notable distress. Cough noted. Congestion audible.   Assessment and Plan: Assessment  Primary Diagnosis & Pertinent Problem List: The primary encounter diagnosis was Chest heaviness. Diagnoses of Personal history of COVID-19, Upper respiratory tract infection, unspecified type, and Menstrual changes were also pertinent to this visit.  Visit Diagnosis: 1. Chest heaviness  Encouraged chest x-ray  2. Personal  history of COVID-19  Encouraged retesting  3. Upper respiratory tract infection, unspecified type  Recurrent upper respiratory symptoms encouraged Mucinex DM along with Augmentin to hopefully resolve current issues Encouraged ongoing allergy treatment  4. Menstrual changes  GYN exam in 4 weeks    Follow Up Instructions:    I discussed the assessment and treatment plan with the patient. The patient was provided an opportunity to ask questions and all were answered. The patient agreed with the plan and demonstrated an understanding of the instructions.   The patient was advised to call back or seek an in-person evaluation if the symptoms worsen or if the condition fails to improve as anticipated.  I provided 29 minutes of video- face-to-face time during this encounter.   Barbette Merino, NP  [email or web address]

## 2021-01-30 ENCOUNTER — Encounter: Payer: Self-pay | Admitting: Nurse Practitioner

## 2021-01-30 LAB — NOVEL CORONAVIRUS, NAA: SARS-CoV-2, NAA: NOT DETECTED

## 2021-01-30 LAB — SARS-COV-2, NAA 2 DAY TAT

## 2021-01-30 LAB — SPECIMEN STATUS REPORT

## 2021-01-31 ENCOUNTER — Telehealth: Payer: Self-pay

## 2021-01-31 NOTE — Telephone Encounter (Signed)
Spoke with patient and questions answered. 

## 2021-01-31 NOTE — Telephone Encounter (Signed)
Pt stated that that we did two different test on her and has questions and also if she referred to get xray? Also note for her being out of work due to being tested.

## 2021-02-26 ENCOUNTER — Encounter: Payer: Medicaid Other | Admitting: Nurse Practitioner

## 2021-03-19 ENCOUNTER — Other Ambulatory Visit: Payer: Self-pay

## 2021-03-19 ENCOUNTER — Encounter: Payer: Self-pay | Admitting: Nurse Practitioner

## 2021-03-19 ENCOUNTER — Ambulatory Visit (INDEPENDENT_AMBULATORY_CARE_PROVIDER_SITE_OTHER): Payer: BLUE CROSS/BLUE SHIELD | Admitting: Nurse Practitioner

## 2021-03-19 VITALS — BP 136/64 | HR 77 | Temp 97.7°F | Ht 62.5 in | Wt 284.0 lb

## 2021-03-19 DIAGNOSIS — J301 Allergic rhinitis due to pollen: Secondary | ICD-10-CM

## 2021-03-19 DIAGNOSIS — Z1231 Encounter for screening mammogram for malignant neoplasm of breast: Secondary | ICD-10-CM | POA: Diagnosis not present

## 2021-03-19 DIAGNOSIS — Z6841 Body Mass Index (BMI) 40.0 and over, adult: Secondary | ICD-10-CM

## 2021-03-19 DIAGNOSIS — Z01419 Encounter for gynecological examination (general) (routine) without abnormal findings: Secondary | ICD-10-CM | POA: Diagnosis not present

## 2021-03-19 DIAGNOSIS — Z113 Encounter for screening for infections with a predominantly sexual mode of transmission: Secondary | ICD-10-CM

## 2021-03-19 DIAGNOSIS — N898 Other specified noninflammatory disorders of vagina: Secondary | ICD-10-CM

## 2021-03-19 DIAGNOSIS — Z124 Encounter for screening for malignant neoplasm of cervix: Secondary | ICD-10-CM

## 2021-03-19 DIAGNOSIS — Z13220 Encounter for screening for lipoid disorders: Secondary | ICD-10-CM

## 2021-03-19 MED ORDER — AMOXICILLIN-POT CLAVULANATE 875-125 MG PO TABS: 1.0000 | ORAL_TABLET | Freq: Two times a day (BID) | ORAL | 0 refills | Status: DC

## 2021-03-19 NOTE — Addendum Note (Signed)
Addended by: Barbette Merino on: 03/19/2021 10:42 AM   Modules accepted: Orders

## 2021-03-19 NOTE — Patient Instructions (Signed)
Health Maintenance, Female Adopting a healthy lifestyle and getting preventive care are important in promoting health and wellness. Ask your health care provider about:  The right schedule for you to have regular tests and exams.  Things you can do on your own to prevent diseases and keep yourself healthy. What should I know about diet, weight, and exercise? Eat a healthy diet  Eat a diet that includes plenty of vegetables, fruits, low-fat dairy products, and lean protein.  Do not eat a lot of foods that are high in solid fats, added sugars, or sodium.   Maintain a healthy weight Body mass index (BMI) is used to identify weight problems. It estimates body fat based on height and weight. Your health care provider can help determine your BMI and help you achieve or maintain a healthy weight. Get regular exercise Get regular exercise. This is one of the most important things you can do for your health. Most adults should:  Exercise for at least 150 minutes each week. The exercise should increase your heart rate and make you sweat (moderate-intensity exercise).  Do strengthening exercises at least twice a week. This is in addition to the moderate-intensity exercise.  Spend less time sitting. Even light physical activity can be beneficial. Watch cholesterol and blood lipids Have your blood tested for lipids and cholesterol at 40 years of age, then have this test every 5 years. Have your cholesterol levels checked more often if:  Your lipid or cholesterol levels are high.  You are older than 40 years of age.  You are at high risk for heart disease. What should I know about cancer screening? Depending on your health history and family history, you may need to have cancer screening at various ages. This may include screening for:  Breast cancer.  Cervical cancer.  Colorectal cancer.  Skin cancer.  Lung cancer. What should I know about heart disease, diabetes, and high blood  pressure? Blood pressure and heart disease  High blood pressure causes heart disease and increases the risk of stroke. This is more likely to develop in people who have high blood pressure readings, are of African descent, or are overweight.  Have your blood pressure checked: ? Every 3-5 years if you are 18-39 years of age. ? Every year if you are 40 years old or older. Diabetes Have regular diabetes screenings. This checks your fasting blood sugar level. Have the screening done:  Once every three years after age 40 if you are at a normal weight and have a low risk for diabetes.  More often and at a younger age if you are overweight or have a high risk for diabetes. What should I know about preventing infection? Hepatitis B If you have a higher risk for hepatitis B, you should be screened for this virus. Talk with your health care provider to find out if you are at risk for hepatitis B infection. Hepatitis C Testing is recommended for:  Everyone born from 1945 through 1965.  Anyone with known risk factors for hepatitis C. Sexually transmitted infections (STIs)  Get screened for STIs, including gonorrhea and chlamydia, if: ? You are sexually active and are younger than 40 years of age. ? You are older than 40 years of age and your health care provider tells you that you are at risk for this type of infection. ? Your sexual activity has changed since you were last screened, and you are at increased risk for chlamydia or gonorrhea. Ask your health care provider   if you are at risk.  Ask your health care provider about whether you are at high risk for HIV. Your health care provider may recommend a prescription medicine to help prevent HIV infection. If you choose to take medicine to prevent HIV, you should first get tested for HIV. You should then be tested every 3 months for as long as you are taking the medicine. Pregnancy  If you are about to stop having your period (premenopausal) and  you may become pregnant, seek counseling before you get pregnant.  Take 400 to 800 micrograms (mcg) of folic acid every day if you become pregnant.  Ask for birth control (contraception) if you want to prevent pregnancy. Osteoporosis and menopause Osteoporosis is a disease in which the bones lose minerals and strength with aging. This can result in bone fractures. If you are 65 years old or older, or if you are at risk for osteoporosis and fractures, ask your health care provider if you should:  Be screened for bone loss.  Take a calcium or vitamin D supplement to lower your risk of fractures.  Be given hormone replacement therapy (HRT) to treat symptoms of menopause. Follow these instructions at home: Lifestyle  Do not use any products that contain nicotine or tobacco, such as cigarettes, e-cigarettes, and chewing tobacco. If you need help quitting, ask your health care provider.  Do not use street drugs.  Do not share needles.  Ask your health care provider for help if you need support or information about quitting drugs. Alcohol use  Do not drink alcohol if: ? Your health care provider tells you not to drink. ? You are pregnant, may be pregnant, or are planning to become pregnant.  If you drink alcohol: ? Limit how much you use to 0-1 drink a day. ? Limit intake if you are breastfeeding.  Be aware of how much alcohol is in your drink. In the [place] one drink equals one 12 oz bottle of beer (355 mL), one 5 oz glass of wine (148 mL), or one 1 oz glass of hard liquor (44 mL). General instructions  Schedule regular health, dental, and eye exams.  Stay current with your vaccines.  Tell your health care provider if: ? You often feel depressed. ? You have ever been abused or do not feel safe at home. Summary  Adopting a healthy lifestyle and getting preventive care are important in promoting health and wellness.  Follow your health care provider's instructions about healthy  diet, exercising, and getting tested or screened for diseases.  Follow your health care provider's instructions on monitoring your cholesterol and blood pressure. This information is not intended to replace advice given to you by your health care provider. Make sure you discuss any questions you have with your health care provider. Document Revised: 10/28/2018 Document Reviewed: 10/28/2018 Elsevier Patient Education  2021 Elsevier Inc. Breast Self-Awareness Breast self-awareness is knowing how your breasts look and feel. Doing breast self-awareness is important. It allows you to catch a breast problem early while it is still small and can be treated. All women should do breast self-awareness, including women who have had breast implants. Tell your doctor if you notice a change in your breasts. What you need:  A mirror.  A well-lit room. How to do a breast self-exam A breast self-exam is one way to learn what is normal for your breasts and to check for changes. To do a breast self-exam: Look for changes 1. Take off all the clothes   above your waist. 2. Stand in front of a mirror in a room with good lighting. 3. Put your hands on your hips. 4. Push your hands down. 5. Look at your breasts and nipples in the mirror to see if one breast or nipple looks different from the other. Check to see if: ? The shape of one breast is different. ? The size of one breast is different. ? There are wrinkles, dips, and bumps in one breast and not the other. 6. Look at each breast for changes in the skin, such as: ? Redness. ? Scaly areas. 7. Look for changes in your nipples, such as: ? Liquid around the nipples. ? Bleeding. ? Dimpling. ? Redness. ? A change in where the nipples are.   Feel for changes 1. Lie on your back on the floor. 2. Feel each breast. To do this, follow these steps: ? Pick a breast to feel. ? Put the arm closest to that breast above your head. ? Use your other arm to feel the  nipple area of your breast. Feel the area with the pads of your three middle fingers by making small circles with your fingers. For the first circle, press lightly. For the second circle, press harder. For the third circle, press even harder. ? Keep making circles with your fingers at the different pressures as you move down your breast. Stop when you feel your ribs. ? Move your fingers a little toward the center of your body. ? Start making circles with your fingers again, this time going up until you reach your collarbone. ? Keep making up-and-down circles until you reach your armpit. Remember to keep using the three pressures. ? Feel the other breast in the same way. 3. Sit or stand in the tub or shower. 4. With soapy water on your skin, feel each breast the same way you did in step 2 when you were lying on the floor.   Write down what you find Writing down what you find can help you remember what to tell your doctor. Write down:  What is normal for each breast.  Any changes you find in each breast, including: ? The kind of changes you find. ? Whether you have pain. ? Size and location of any lumps.  When you last had your menstrual period. General tips  Check your breasts every month.  If you are breastfeeding, the best time to check your breasts is after you feed your baby or after you use a breast pump.  If you get menstrual periods, the best time to check your breasts is 5-7 days after your menstrual period is over.  With time, you will become comfortable with the self-exam, and you will begin to know if there are changes in your breasts. Contact a doctor if you:  See a change in the shape or size of your breasts or nipples.  See a change in the skin of your breast or nipples, such as red or scaly skin.  Have fluid coming from your nipples that is not normal.  Find a lump or thick area that was not there before.  Have pain in your breasts.  Have any concerns about your  breast health. Summary  Breast self-awareness includes looking for changes in your breasts, as well as feeling for changes within your breasts.  Breast self-awareness should be done in front of a mirror in a well-lit room.  You should check your breasts every month. If you get menstrual periods, the   best time to check your breasts is 5-7 days after your menstrual period is over.  Let your doctor know of any changes you see in your breasts, including changes in size, changes on the skin, pain or tenderness, or fluid from your nipples that is not normal. This information is not intended to replace advice given to you by your health care provider. Make sure you discuss any questions you have with your health care provider. Document Revised: 06/23/2018 Document Reviewed: 06/23/2018 Elsevier Patient Education  2021 Elsevier Inc.   

## 2021-03-19 NOTE — Progress Notes (Signed)
[facility] Patient [facility] [address] [place], [place]  [address] Phone:  [phone]   Fax:  [phone] Subjective:     Melissa Guerra is a 40 y.o. female and is here for a comprehensive physical exam. The patient reports problems - vaginal disharge and allergy symptoms.  Social History   Socioeconomic History  . Marital status: Single    Spouse name: Not on file  . Number of children: Not on file  . Years of education: Not on file  . Highest education level: Not on file  Occupational History  . Not on file  Tobacco Use  . Smoking status: Former Smoker    Types: Cigarettes  . Smokeless tobacco: Never Used  Vaping Use  . Vaping Use: Never used  Substance and Sexual Activity  . Alcohol use: No  . Drug use: No  . Sexual activity: Yes    Birth control/protection: Other-see comments    Comment: female partner  Other Topics Concern  . Not on file  Social History Narrative  . Not on file   Social Determinants of Health   Financial Resource Strain: Not on file  Food Insecurity: Not on file  Transportation Needs: Not on file  Physical Activity: Not on file  Stress: Not on file  Social Connections: Not on file  Intimate Partner Violence: Not on file   Health Maintenance  Topic Date Due  . PAP SMEAR-Modifier  09/02/2014  . COVID-19 Vaccine (1) 04/18/2021 (Originally 03/13/1986)  . TETANUS/TDAP  10/20/2021 (Originally 03/13/2000)  . Hepatitis C Screening  10/20/2021 (Originally 1980-12-10)  . HIV Screening  10/20/2021 (Originally 03/13/1996)  . INFLUENZA VACCINE  06/18/2021  . HPV VACCINES  Aged Out    The following portions of the patient's history were reviewed and updated as appropriate: allergies, current medications, past family history, past medical history, past social history, past surgical history and problem list.  Review of Systems Ears, nose, mouth, throat, and face: positive for nasal congestion   Objective:    General appearance: cooperative,  no distress and morbidly obese Breasts: normal appearance, no masses or tenderness Pelvic: cervix normal in appearance, external genitalia normal, no adnexal masses or tenderness, no cervical motion tenderness, rectovaginal septum normal, uterus normal size, shape, and consistency and vagina normal without discharge Lymph nodes: Cervical, supraclavicular, and axillary nodes normal.    Assessment:    Healthy female exam.      Assessment  Primary Diagnosis & Pertinent Problem List: The primary encounter diagnosis was Class 3 severe obesity due to excess calories without serious comorbidity with body mass index (BMI) of 45.0 to 49.9 in adult [facility]). Diagnoses of Seasonal allergic rhinitis due to pollen, Encounter for screening mammogram for malignant neoplasm of breast, Screening for cervical cancer, Vaginal discharge, Screening for STD (sexually transmitted disease), Screening for lipid disorders, and Encounter for gynecological examination without abnormal finding were also pertinent to this visit.  Visit Diagnosis: 1. Class 3 severe obesity due to excess calories without serious comorbidity with body mass index (BMI) of 45.0 to 49.9 in adult [facility])  Persistent Obesity with BMI and comorbidities as noted above.  Discussed proper diet (low fat, low sodium, high fiber) with patient.   Discussed need for regular exercise (3 times per week, 20 minutes per session) with patient.   2. Seasonal allergic rhinitis due to pollen  Suggested Xyzal patient declined Wait and see Anbx provided   3. Encounter for screening mammogram for malignant neoplasm of breast  4. Screening for cervical cancer   5. Vaginal discharge   6. Screening for STD (sexually transmitted disease)   7. Screening for lipid disorders   8. Encounter for gynecological examination without abnormal finding     Plan:  Screening labs    See After Visit Summary for Counseling Recommendations

## 2021-03-20 ENCOUNTER — Telehealth: Payer: Self-pay

## 2021-03-20 ENCOUNTER — Other Ambulatory Visit: Payer: Self-pay

## 2021-03-20 LAB — STD SCREEN (8)
HIV Screen 4th Generation wRfx: NONREACTIVE
HSV 1 Glycoprotein G Ab, IgG: 1.76 index — ABNORMAL HIGH (ref 0.00–0.90)
HSV 2 IgG, Type Spec: <0.91 index (ref 0.00–0.90)
Hep A IgM: NEGATIVE
Hep B C IgM: NEGATIVE
Hep C Virus Ab: <0.1 s/co ratio (ref 0.0–0.9)
Hepatitis B Surface Ag: NEGATIVE
RPR Ser Ql: NONREACTIVE

## 2021-03-20 LAB — COMP. METABOLIC PANEL (12)
AST: 15 IU/L (ref 0–40)
Albumin/Globulin Ratio: 1.7 (ref 1.2–2.2)
Albumin: 4 g/dL (ref 3.8–4.8)
Alkaline Phosphatase: 83 IU/L (ref 44–121)
BUN/Creatinine Ratio: 15 (ref 9–23)
BUN: 9 mg/dL (ref 6–24)
Bilirubin Total: 0.4 mg/dL (ref 0.0–1.2)
Calcium: 8.5 mg/dL — ABNORMAL LOW (ref 8.7–10.2)
Chloride: 105 mmol/L (ref 96–106)
Creatinine, Ser: 0.6 mg/dL (ref 0.57–1.00)
Globulin, Total: 2.4 g/dL (ref 1.5–4.5)
Glucose: 79 mg/dL (ref 65–99)
Potassium: 3.9 mmol/L (ref 3.5–5.2)
Sodium: 140 mmol/L (ref 134–144)
Total Protein: 6.4 g/dL (ref 6.0–8.5)
eGFR: 116 mL/min/{1.73_m2} (ref >59)

## 2021-03-20 LAB — LIPID PANEL
Chol/HDL Ratio: 2 ratio (ref 0.0–4.4)
Cholesterol, Total: 122 mg/dL (ref 100–199)
HDL: 60 mg/dL (ref >39)
LDL Chol Calc (NIH): 48 mg/dL (ref 0–99)
Triglycerides: 70 mg/dL (ref 0–149)
VLDL Cholesterol Cal: 14 mg/dL (ref 5–40)

## 2021-03-20 MED ORDER — CETIRIZINE HCL 10 MG PO TABS: 10.0000 mg | ORAL_TABLET | Freq: Every day | ORAL | 4 refills | Status: DC

## 2021-03-20 NOTE — Telephone Encounter (Signed)
Pt called and stated that the wrong medicine was refilled. It's a new one that provider siad.  Also wanting to know about test results ...she pulled them online but needs a better understanding

## 2021-03-20 NOTE — Telephone Encounter (Signed)
Forwarding

## 2021-03-21 ENCOUNTER — Other Ambulatory Visit: Payer: Self-pay | Admitting: Nurse Practitioner

## 2021-03-21 MED ORDER — LEVOCETIRIZINE DIHYDROCHLORIDE 5 MG PO TABS: 5.0000 mg | ORAL_TABLET | Freq: Every evening | ORAL | 3 refills | Status: DC

## 2021-03-21 NOTE — Telephone Encounter (Signed)
Xyzal sent  She stated it sounded like it was going make her sleep. So took this as a; decline.

## 2021-03-21 NOTE — Telephone Encounter (Signed)
Called spoke with patient regarding  this medication, informed pt to take this medication at night .

## 2021-03-21 NOTE — Progress Notes (Signed)
   [facility] Patient [facility] [address] [place], [place]  [address] Phone:  [phone]   Fax:  [phone]  Declined Cetirizine Request Xyzal trial

## 2021-03-22 LAB — IGP, CTNG, RFX APTIMA HPV ASCU
Chlamydia, Nuc. Acid Amp: NEGATIVE
Gonococcus by Nucleic Acid Amp: NEGATIVE

## 2021-04-04 ENCOUNTER — Ambulatory Visit: Payer: BLUE CROSS/BLUE SHIELD | Attending: Critical Care Medicine

## 2021-04-04 DIAGNOSIS — Z20822 Contact with and (suspected) exposure to covid-19: Secondary | ICD-10-CM

## 2021-04-05 ENCOUNTER — Ambulatory Visit: Payer: BLUE CROSS/BLUE SHIELD

## 2021-04-05 LAB — SARS-COV-2, NAA 2 DAY TAT

## 2021-04-05 LAB — NOVEL CORONAVIRUS, NAA: SARS-CoV-2, NAA: NOT DETECTED

## 2021-04-16 ENCOUNTER — Other Ambulatory Visit: Payer: Self-pay | Admitting: Nurse Practitioner

## 2021-04-16 MED ORDER — ALBUTEROL SULFATE HFA 108 (90 BASE) MCG/ACT IN AERS: 2.0000 | INHALATION_SPRAY | Freq: Four times a day (QID) | RESPIRATORY_TRACT | 11 refills | Status: DC | PRN

## 2021-04-16 NOTE — Progress Notes (Unsigned)
   [facility] [address] [place], [place]  [address] Phone:  [phone]   Fax:  [phone] 

## 2021-04-25 ENCOUNTER — Telehealth (INDEPENDENT_AMBULATORY_CARE_PROVIDER_SITE_OTHER): Payer: Medicaid Other | Admitting: Nurse Practitioner

## 2021-04-25 ENCOUNTER — Other Ambulatory Visit: Payer: Self-pay

## 2021-04-25 DIAGNOSIS — Z09 Encounter for follow-up examination after completed treatment for conditions other than malignant neoplasm: Secondary | ICD-10-CM

## 2021-04-25 NOTE — Progress Notes (Signed)
   [facility] [address] [place],   [address] Phone:  [phone]   Fax:  [phone] 

## 2021-04-30 ENCOUNTER — Ambulatory Visit: Payer: Medicaid Other | Admitting: Nurse Practitioner

## 2021-04-30 ENCOUNTER — Other Ambulatory Visit: Payer: Self-pay | Admitting: Nurse Practitioner

## 2021-04-30 ENCOUNTER — Other Ambulatory Visit: Payer: Self-pay

## 2021-04-30 DIAGNOSIS — B009 Herpesviral infection, unspecified: Secondary | ICD-10-CM

## 2021-04-30 DIAGNOSIS — Z113 Encounter for screening for infections with a predominantly sexual mode of transmission: Secondary | ICD-10-CM

## 2021-05-02 LAB — HSV(HERPES SIMPLEX VRS) I + II AB-IGM: HSVI/II Comb IgM: <0.91 Ratio (ref 0.00–0.90)

## 2021-05-03 ENCOUNTER — Telehealth (INDEPENDENT_AMBULATORY_CARE_PROVIDER_SITE_OTHER): Payer: Medicaid Other | Admitting: Nurse Practitioner

## 2021-05-03 DIAGNOSIS — R350 Frequency of micturition: Secondary | ICD-10-CM | POA: Diagnosis not present

## 2021-05-03 MED ORDER — ALBUTEROL SULFATE (2.5 MG/3ML) 0.083% IN NEBU: 2.5000 mg | INHALATION_SOLUTION | Freq: Four times a day (QID) | RESPIRATORY_TRACT | 12 refills | Status: DC | PRN

## 2021-05-03 NOTE — Progress Notes (Signed)
   [facility] Patient [facility] [name], [place]  [address] Phone:  [phone]   Fax:  [phone]  Virtual Visit via Telephone Note  I connected with Melissa Guerra on 05/03/21 at  3:15 PM EDT by telephone and verified that I am speaking with the correct person using two identifiers.   I discussed the limitations, risks, security and privacy concerns of performing an evaluation and management service by telephone and the availability of in person appointments. I also discussed with the patient that there may be a patient responsible charge related to this service. The patient expressed understanding and agreed to proceed.  Patient home Provider Office  History of Present Illness:  Melissa Guerra  has a past medical history of Abnormal Pap smear, Acid reflux, Asthma, and Obesity.    She reports that she has been having 2 weeks of the urinary frequency and flank pain. Denies headache, dizziness, visual changes, shortness of breath, dyspnea on exertion, chest pain, nausea, vomiting or any edema.   Observations/Objective: No exam; telephone visit  Assessment and Plan: Assessment  Primary Diagnosis & Pertinent Problem List: The encounter diagnosis was Urinary frequency.  Visit Diagnosis: 1. Urinary frequency  Nurse visit on tomorrow for a urinalysis     Follow Up Instructions: As scheduled   I discussed the assessment and treatment plan with the patient. The patient was provided an opportunity to ask questions and all were answered. The patient agreed with the plan and demonstrated an understanding of the instructions.   The patient was advised to call back or seek an in-person evaluation if the symptoms worsen or if the condition fails to improve as anticipated.  I provided 8 minutes of telephone- visit time during this encounter.   Barbette Merino, NP

## 2021-05-03 NOTE — Patient Instructions (Signed)

## 2021-05-04 ENCOUNTER — Ambulatory Visit (INDEPENDENT_AMBULATORY_CARE_PROVIDER_SITE_OTHER): Payer: Medicaid Other | Admitting: Nurse Practitioner

## 2021-05-04 ENCOUNTER — Other Ambulatory Visit: Payer: Self-pay

## 2021-05-04 ENCOUNTER — Other Ambulatory Visit: Payer: Self-pay | Admitting: Nurse Practitioner

## 2021-05-04 DIAGNOSIS — R82998 Other abnormal findings in urine: Secondary | ICD-10-CM | POA: Diagnosis not present

## 2021-05-04 DIAGNOSIS — R3989 Other symptoms and signs involving the genitourinary system: Secondary | ICD-10-CM | POA: Diagnosis not present

## 2021-05-04 LAB — POCT URINALYSIS DIPSTICK
Bilirubin, UA: NEGATIVE
Glucose, UA: NEGATIVE
Ketones, UA: NEGATIVE
Nitrite, UA: NEGATIVE
Protein, UA: POSITIVE — AB
Spec Grav, UA: >=1.03 — AB (ref 1.010–1.025)
Urobilinogen, UA: 0.2 E.U./dL
pH, UA: 6.5 (ref 5.0–8.0)

## 2021-05-04 MED ORDER — NITROFURANTOIN MONOHYD MACRO 100 MG PO CAPS: 100.0000 mg | ORAL_CAPSULE | Freq: Two times a day (BID) | ORAL | 0 refills | Status: AC

## 2021-05-04 NOTE — Progress Notes (Signed)
Patient in to give urine sample, has virtual visit with Crystal complaining of low back back and frequent urination.

## 2021-05-04 NOTE — Progress Notes (Signed)
   [facility] [address] [place], [place]  [address] Phone:  [phone]   Fax:  [phone] 

## 2021-05-04 NOTE — Addendum Note (Signed)
Addended by: Eduard Clos on: 05/04/2021 10:52 AM   Modules accepted: Orders

## 2021-05-09 ENCOUNTER — Encounter (HOSPITAL_COMMUNITY): Payer: Self-pay | Admitting: Emergency Medicine

## 2021-05-09 ENCOUNTER — Other Ambulatory Visit: Payer: Self-pay

## 2021-05-09 ENCOUNTER — Emergency Department (HOSPITAL_COMMUNITY): Payer: Medicaid Other

## 2021-05-09 ENCOUNTER — Emergency Department (HOSPITAL_COMMUNITY)
Admission: EM | Admit: 2021-05-09 | Discharge: 2021-05-10 | Disposition: A | Discharge status: Home / Self Care | Payer: Medicaid Other | Attending: Emergency Medicine | Admitting: Emergency Medicine

## 2021-05-09 DIAGNOSIS — J45909 Unspecified asthma, uncomplicated: Secondary | ICD-10-CM | POA: Diagnosis not present

## 2021-05-09 DIAGNOSIS — Z85828 Personal history of other malignant neoplasm of skin: Secondary | ICD-10-CM | POA: Insufficient documentation

## 2021-05-09 DIAGNOSIS — Z91010 Allergy to peanuts: Secondary | ICD-10-CM | POA: Diagnosis not present

## 2021-05-09 DIAGNOSIS — R0981 Nasal congestion: Secondary | ICD-10-CM | POA: Insufficient documentation

## 2021-05-09 DIAGNOSIS — H9209 Otalgia, unspecified ear: Secondary | ICD-10-CM | POA: Insufficient documentation

## 2021-05-09 DIAGNOSIS — Z7951 Long term (current) use of inhaled steroids: Secondary | ICD-10-CM | POA: Insufficient documentation

## 2021-05-09 DIAGNOSIS — U071 COVID-19: Secondary | ICD-10-CM | POA: Diagnosis not present

## 2021-05-09 DIAGNOSIS — Z87891 Personal history of nicotine dependence: Secondary | ICD-10-CM | POA: Insufficient documentation

## 2021-05-09 DIAGNOSIS — R059 Cough, unspecified: Secondary | ICD-10-CM | POA: Diagnosis not present

## 2021-05-09 LAB — RESP PANEL BY RT-PCR (FLU A&B, COVID) ARPGX2
Influenza A by PCR: NEGATIVE
Influenza B by PCR: NEGATIVE
SARS Coronavirus 2 by RT PCR: POSITIVE — AB

## 2021-05-09 LAB — URINE CULTURE

## 2021-05-09 NOTE — ED Provider Notes (Signed)
[facility]-EMERGENCY DEPT Provider Note   CSN: [id number] Arrival date & time: 05/09/21  2034     History Chief Complaint  Patient presents with   Cough    Melissa Guerra is a 40 y.o. female with a hx of asthma & acid reflux who presents to the ED with complaints of URI sxs x 3 days. Patient reports chills, congestion, sore throat, ear pain, dry cough, and generalized body aches. No alleviating/aggravating factors. Sick contacts w/ individuals diagnosed with covid. Has had 2 doses of covid vaccine. Denies fever, N/V/D, chest pain, dyspnea, wheezing, or syncope.    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    Past Surgical History:  Procedure Laterality Date   FOOT SURGERY     TONSILLECTOMY     TONSILLECTOMY AND ADENOIDECTOMY     TONSILLECTOMY AND ADENOIDECTOMY     WISDOM TOOTH EXTRACTION       OB History     Gravida  2   Para  2   Term      Preterm      AB      Living         SAB      IAB      Ectopic      Multiple      Live Births              Family History  Problem Relation Age of Onset   Cancer Mother    HIV Mother    Diabetes Maternal Grandmother    Diabetes Paternal Grandmother    Heart failure Father     Social History   Tobacco Use   Smoking status: Former    Pack years: 0.00    Types: Cigarettes   Smokeless tobacco: Never  Vaping Use   Vaping Use: Never used  Substance Use Topics   Alcohol use: No   Drug use: No    Home Medications Prior to Admission medications   Medication Sig  Start Date End Date Taking? Authorizing Provider  albuterol (PROVENTIL) (2.5 MG/3ML) 0.083% nebulizer solution Take 3 mLs (2.5 mg total) by nebulization every 6 (six) hours as needed for wheezing or shortness of breath. 05/03/21   Barbette Merino, NP  albuterol (VENTOLIN HFA) 108 (90 Base) MCG/ACT inhaler Inhale 2 puffs into the lungs every 6 (six) hours as needed for wheezing or shortness of breath. 04/16/21   Barbette Merino, NP  ferrous gluconate (FERGON) 324 MG tablet Take 1 tablet (324 mg total) by mouth daily with breakfast. 11/09/20 11/09/21  Barbette Merino, NP  fluticasone (FLONASE) 50 MCG/ACT nasal spray Place 2 sprays into both nostrils daily. 10/23/20   Barbette Merino, NP  ibuprofen (ADVIL,MOTRIN) 200 MG tablet Take 400 mg by mouth every 6 (six) hours as needed for headache or mild pain.    [provider]  levocetirizine (XYZAL) 5 MG tablet Take 1 tablet (5 mg total) by mouth every evening. 03/21/21 03/21/22  Thad Ranger  M, NP  nitrofurantoin, macrocrystal-monohydrate, (MACROBID) 100 MG capsule Take 1 capsule (100 mg total) by mouth 2 (two) times daily for 5 days. 05/04/21 05/09/21  Barbette Merino, NP    Allergies    Codeine, Peanut allergen powder-dnfp, Peanut-containing drug products, and Shrimp [shellfish allergy]  Review of Systems   Review of Systems  Constitutional:  Positive for chills. Negative for fever.  HENT:  Positive for congestion, ear pain and sore throat.   Respiratory:  Positive for cough. Negative for shortness of breath.   Cardiovascular:  Negative for chest pain and leg swelling.  Gastrointestinal:  Negative for abdominal pain, blood in stool, diarrhea, nausea and vomiting.  Neurological:  Negative for syncope.  All other systems reviewed and are negative.  Physical Exam Updated Vital Signs BP 136/71 (BP Location: Right Arm)   Pulse 84   Temp 98.7 F (37.1 C) (Oral)   Resp 16   LMP 04/30/2021 (Approximate)   SpO2 100%   Physical Exam Vitals and  nursing note reviewed.  Constitutional:      General: She is not in acute distress.    Appearance: She is well-developed. She is not toxic-appearing.  HENT:     Head: Normocephalic and atraumatic.     Right Ear: Tympanic membrane is not perforated, retracted or bulging.     Left Ear: Tympanic membrane is not perforated, retracted or bulging.     Ears:     Comments: No mastoid erythema/swelling.     Nose: Congestion present.     Mouth/Throat:     Pharynx: Oropharynx is clear.     Comments: Tolerating own secretions without difficulty.  No trismus/hot potato voice.  Eyes:     General:        Right eye: No discharge.        Left eye: No discharge.     Conjunctiva/sclera: Conjunctivae normal.  Cardiovascular:     Rate and Rhythm: Normal rate and regular rhythm.  Pulmonary:     Effort: Pulmonary effort is normal. No respiratory distress.     Breath sounds: Normal breath sounds. No wheezing, rhonchi or rales.  Abdominal:     General: There is no distension.     Palpations: Abdomen is soft.     Tenderness: There is no abdominal tenderness.  Musculoskeletal:     Cervical back: Neck supple.  Skin:    General: Skin is warm and dry.     Findings: No rash.  Neurological:     Mental Status: She is alert.     Comments: Clear speech.   Psychiatric:        Behavior: Behavior normal.    ED Results / Procedures / Treatments   Labs (all labs ordered are listed, but only abnormal results are displayed) Labs Reviewed  RESP PANEL BY RT-PCR (FLU A&B, COVID) ARPGX2 - Abnormal; Notable for the following components:      Result Value   SARS Coronavirus 2 by RT PCR POSITIVE (*)    All other components within normal limits    EKG None  Radiology DG Chest 2 View  Result Date: 05/09/2021 CLINICAL DATA:  40 year old female with cough. EXAM: CHEST - 2 VIEW COMPARISON:  Chest radiograph dated 09/23/2020. FINDINGS: The heart size and mediastinal contours are within normal limits. Both lungs  are clear. The visualized skeletal structures are unremarkable. IMPRESSION: No active cardiopulmonary disease. Electronically Signed   By: Elgie Collard M.D.   On: 05/09/2021 21:10    Procedures Procedures  Medications Ordered in ED Medications - No data to display  ED Course  I have reviewed the triage vital signs and the nursing notes.  Pertinent labs & imaging results that were available during my care of the patient were reviewed by me and considered in my medical decision making (see chart for details).  Melissa Guerra was evaluated in Emergency Department on 05/10/2021 for the symptoms described in the history of present illness. He/she was evaluated in the context of the global COVID-19 pandemic, which necessitated consideration that the patient might be at risk for infection with the SARS-CoV-2 virus that causes COVID-19. Institutional protocols and algorithms that pertain to the evaluation of patients at risk for COVID-19 are in a state of rapid change based on information released by regulatory bodies including the CDC and federal and state organizations. These policies and algorithms were followed during the patient's care in the ED.    MDM Rules/Calculators/A&P                          Patient presents to the ED with complaints of URI sxs.  Nontoxic, vitals WNL.   Additional history obtained:  Additional history obtained from chart review & nursing note review.   Lab Tests:  I reviewed and interpreted labs, which included:  COVID testing- positive Flu testing- negative.   Imaging Studies ordered:  CXR ordered by triage, I independently visualized and interpreted imaging which showed No active cardiopulmonary disease.  ED Course:  Mild congestion, overall reassuring HEENT exam.  No meningeal signs. Lungs are CTA without focal adventitious sounds, no signs of increased work of breathing, CXR without infiltrate, doubt CAP. No wheezing on exam to suggest asthma  exacerbation.  COVID positive. Ambulated throughout exam room with SpO2 99% on RA. Does not appear to require admission. Discussed with pharmacist Leann- recommends paxlovid which I am in agreement with given asthma & obesity history. Last creatinine WNL from May of this year. Patient in agreement with taking this medication.   I discussed results, treatment plan, need for follow-up, and return precautions with the patient. Provided opportunity for questions, patient confirmed understanding and is in agreement with plan.   Portions of this note were generated with Scientist, clinical (histocompatibility and immunogenetics). Dictation errors may occur despite best attempts at proofreading.  Final Clinical Impression(s) / ED Diagnoses Final diagnoses:  COVID-19    Rx / DC Orders ED Discharge Orders          Ordered    nirmatrelvir/ritonavir EUA (PAXLOVID) TABS  2 times daily        05/10/21 0017    benzonatate (TESSALON) 100 MG capsule  Every 8 hours        05/10/21 0017             Dmetrius Ambs, Pleas Koch, PA-C 05/10/21 0017    Molpus, Jonny Ruiz, MD 05/10/21 0210

## 2021-05-09 NOTE — ED Triage Notes (Signed)
Pt arriving with flu-like symptoms. Dry cough, headache, left ear pain, and congestion present x3 days.

## 2021-05-09 NOTE — ED Provider Notes (Signed)
Emergency Medicine Provider Triage Evaluation Note  Melissa Guerra 40 y.o. female was evaluated in triage.  Pt complains of 3 days of rhinorrhea, congestion, sore throat, left ear pain, headache, body aches, fatigue, cough.  She reports that she has been vaccinated for COVID.  She reports that work, there has been some COVID cases.     Review of Systems  Positive: Cough, headache, sore throat, nasal congestion, rhinorrhea Negative: Abdominal pain, vomiting.  Physical Exam  BP 134/82   Pulse 70   Temp 98.2 F (36.8 C) (Oral)   Resp 18   Ht 5\' 4"  (1.626 m)   Wt 65.8 kg   SpO2 100%   BMI 24.89 kg/m  Gen:   Awake, no distress   HEENT:  Atraumatic.  Rhinorrhea present.  Posterior pharynx is clear.  No erythema, edema. Resp:  Normal effort  Cardiac:  Normal rate  Abd:   Nondistended, nontender MSK:   Moves extremities without difficulty  Neuro:  Speech clear   Other:      Medical Decision Making  Medically screening exam initiated at 8:58 PM  Appropriate orders placed.  Melissa Guerra was informed that the remainder of the evaluation will be completed by another provider, this initial triage assessment does not replace that evaluation. They are counseled that they will need to remain in the ED until the completion of their workup, including full H&P and results of any tests.  Risks of leaving the emergency department prior to completion of treatment were discussed. Patient was advised to inform ED staff if they are leaving before their treatment is complete. The patient acknowledged these risks and time was allowed for questions.     The patient appears stable so that the remainder of the MSE may be completed by another provider.    Clinical Impression  Headache, generalized body aches, sore throat, left ear pain, rhinorrhea, congestion.   Portions of this note were generated with Caswell Corwin. Dictation errors may occur despite best attempts at proofreading.      Scientist, clinical (histocompatibility and immunogenetics), PA-C 05/09/21 2058    2059, MD 05/09/21 2226

## 2021-05-10 MED ORDER — NIRMATRELVIR/RITONAVIR (PAXLOVID)TABLET: 3.0000 | ORAL_TABLET | Freq: Two times a day (BID) | ORAL | 0 refills | Status: AC

## 2021-05-10 MED ORDER — BENZONATATE 100 MG PO CAPS: 100.0000 mg | ORAL_CAPSULE | Freq: Three times a day (TID) | ORAL | 0 refills | Status: DC

## 2021-05-10 NOTE — Discharge Instructions (Addendum)
You have tested positive for COVID-19 in the emergency department.  We are sending you home with pack Slo-Bid, this is an antiviral medication that we are using to treat COVID-19.  Please take this as prescribed.  We are also sending home with Tessalon to take every 8 hours as needed for coughing.  We have prescribed you new medication(s) today. Discuss the medications prescribed today with your pharmacist as they can have adverse effects and interactions with your other medicines including over the counter and prescribed medications. Seek medical evaluation if you start to experience new or abnormal symptoms after taking one of these medicines, seek care immediately if you start to experience difficulty breathing, feeling of your throat closing, facial swelling, or rash as these could be indications of a more serious allergic reaction.  Additionally return if you start to have problems urinating.   We are instructing patient's with COVID 19 or symptoms of COVID 19 to follow the below instructions regardless of vaccination status:  - Stay home for days 1-5, day 0 is your first day of symptoms.  - If you have no symptoms or your symptoms are resolving after day 5 you can leave your house on day 6--> Continue to wear a mask around others for 5 additional days. - If you have a fever, continue to stay home until your fever resolves.   Please follow up with primary care within 3-5 days for re-evaluation- call prior to going to the office to make them aware of your symptoms as some offices are altering their method of seeing patients with COVID 19 symptoms, we have also provided our [place] covid clinic for follow up as well.  Return to the ER for new or worsening symptoms including but not limited to increased work of breathing, chest pain, passing out, or any other concerns.       Person Under Monitoring Name: Melissa Guerra  Location: [address] [place] [address]   Infection Prevention  Recommendations for Individuals Confirmed to have, or Being Evaluated for, 2019 Novel Coronavirus (COVID-19) Infection Who Receive Care at Home  Individuals who are confirmed to have, or are being evaluated for, COVID-19 should follow the prevention steps below until a healthcare provider or local or state health department says they can return to normal activities.  Stay home except to get medical care You should restrict activities outside your home, except for getting medical care. Do not go to work, school, or public areas, and do not use public transportation or taxis.  Call ahead before visiting your doctor Before your medical appointment, call the healthcare provider and tell them that you have, or are being evaluated for, COVID-19 infection. This will help the healthcare provider's office take steps to keep other people from getting infected. Ask your healthcare provider to call the local or state health department.  Monitor your symptoms Seek prompt medical attention if your illness is worsening (e.g., difficulty breathing). Before going to your medical appointment, call the healthcare provider and tell them that you have, or are being evaluated for, COVID-19 infection. Ask your healthcare provider to call the local or state health department.  Wear a facemask You should wear a facemask that covers your nose and mouth when you are in the same room with other people and when you visit a healthcare provider. People who live with or visit you should also wear a facemask while they are in the same room with you.  Separate yourself from other people  in your home As much as possible, you should stay in a different room from other people in your home. Also, you should use a separate bathroom, if available.  Avoid sharing household items You should not share dishes, drinking glasses, cups, eating utensils, towels, bedding, or other items with other people in your home. After using  these items, you should wash them thoroughly with soap and water.  Cover your coughs and sneezes Cover your mouth and nose with a tissue when you cough or sneeze, or you can cough or sneeze into your sleeve. Throw used tissues in a lined trash can, and immediately wash your hands with soap and water for at least 20 seconds or use an alcohol-based hand rub.  Wash your [employer] your hands often and thoroughly with soap and water for at least 20 seconds. You can use an alcohol-based hand sanitizer if soap and water are not available and if your hands are not visibly dirty. Avoid touching your eyes, nose, and mouth with unwashed hands.   Prevention Steps for Caregivers and Household Members of Individuals Confirmed to have, or Being Evaluated for, COVID-19 Infection Being Cared for in the Home  If you live with, or provide care at home for, a person confirmed to have, or being evaluated for, COVID-19 infection please follow these guidelines to prevent infection:  Follow healthcare provider's instructions Make sure that you understand and can help the patient follow any healthcare provider instructions for all care.  Provide for the patient's basic needs You should help the patient with basic needs in the home and provide support for getting groceries, prescriptions, and other personal needs.  Monitor the patient's symptoms If they are getting sicker, call his or her medical provider and tell them that the patient has, or is being evaluated for, COVID-19 infection. This will help the healthcare provider's office take steps to keep other people from getting infected. Ask the healthcare provider to call the local or state health department.  Limit the number of people who have contact with the patient If possible, have only one caregiver for the patient. Other household members should stay in another home or place of residence. If this is not possible, they should stay in another room,  or be separated from the patient as much as possible. Use a separate bathroom, if available. Restrict visitors who do not have an essential need to be in the home.  Keep older adults, very young children, and other sick people away from the patient Keep older adults, very young children, and those who have compromised immune systems or chronic health conditions away from the patient. This includes people with chronic heart, lung, or kidney conditions, diabetes, and cancer.  Ensure good ventilation Make sure that shared spaces in the home have good air flow, such as from an air conditioner or an opened window, weather permitting.  Wash your hands often Wash your hands often and thoroughly with soap and water for at least 20 seconds. You can use an alcohol based hand sanitizer if soap and water are not available and if your hands are not visibly dirty. Avoid touching your eyes, nose, and mouth with unwashed hands. Use disposable paper towels to dry your hands. If not available, use dedicated cloth towels and replace them when they become wet.  Wear a facemask and gloves Wear a disposable facemask at all times in the room and gloves when you touch or have contact with the patient's blood, body fluids, and/or secretions  or excretions, such as sweat, saliva, sputum, nasal mucus, vomit, urine, or feces.  Ensure the mask fits over your nose and mouth tightly, and do not touch it during use. Throw out disposable facemasks and gloves after using them. Do not reuse. Wash your hands immediately after removing your facemask and gloves. If your personal clothing becomes contaminated, carefully remove clothing and launder. Wash your hands after handling contaminated clothing. Place all used disposable facemasks, gloves, and other waste in a lined container before disposing them with other household waste. Remove gloves and wash your hands immediately after handling these items.  Do not share dishes,  glasses, or other household items with the patient Avoid sharing household items. You should not share dishes, drinking glasses, cups, eating utensils, towels, bedding, or other items with a patient who is confirmed to have, or being evaluated for, COVID-19 infection. After the person uses these items, you should wash them thoroughly with soap and water.  Wash laundry thoroughly Immediately remove and wash clothes or bedding that have blood, body fluids, and/or secretions or excretions, such as sweat, saliva, sputum, nasal mucus, vomit, urine, or feces, on them. Wear gloves when handling laundry from the patient. Read and follow directions on labels of laundry or clothing items and detergent. In general, wash and dry with the warmest temperatures recommended on the label.  Clean all areas the individual has used often Clean all touchable surfaces, such as counters, tabletops, doorknobs, bathroom fixtures, toilets, phones, keyboards, tablets, and bedside tables, every day. Also, clean any surfaces that may have blood, body fluids, and/or secretions or excretions on them. Wear gloves when cleaning surfaces the patient has come in contact with. Use a diluted bleach solution (e.g., dilute bleach with 1 part bleach and 10 parts water) or a household disinfectant with a label that says EPA-registered for coronaviruses. To make a bleach solution at home, add 1 tablespoon of bleach to 1 quart (4 cups) of water. For a larger supply, add  cup of bleach to 1 gallon (16 cups) of water. Read labels of cleaning products and follow recommendations provided on product labels. Labels contain instructions for safe and effective use of the cleaning product including precautions you should take when applying the product, such as wearing gloves or eye protection and making sure you have good ventilation during use of the product. Remove gloves and wash hands immediately after cleaning.  Monitor yourself for signs and  symptoms of illness Caregivers and household members are considered close contacts, should monitor their health, and will be asked to limit movement outside of the home to the extent possible. Follow the monitoring steps for close contacts listed on the symptom monitoring form.   ? If you have additional questions, contact your local health department or call the epidemiologist on call at [phone] (available 24/7). ? This guidance is subject to change. For the most up-to-date guidance from [employer] [facility] [employer] [facility], please refer to their website: [employer]

## 2021-05-11 ENCOUNTER — Telehealth: Payer: Self-pay

## 2021-05-11 ENCOUNTER — Telehealth: Payer: Self-pay | Admitting: Nurse Practitioner

## 2021-05-11 NOTE — Telephone Encounter (Signed)
Voicemail full unable to leave message

## 2021-05-11 NOTE — Telephone Encounter (Signed)
Transition Care Management Unsuccessful Follow-up Telephone Call  Date of discharge and from where:  05/10/2021 from [facility]  Attempts:  1st Attempt  Reason for unsuccessful TCM follow-up call:  Unable to leave message

## 2021-05-11 NOTE — Telephone Encounter (Signed)
-----   Message from Barbette Merino, NP sent at 05/10/2021  1:14 PM EDT ----- MyChart message sent to the patient. However additional follow up maybe needed. Thanks

## 2021-05-14 NOTE — Telephone Encounter (Signed)
Transition Care Management Follow-up Telephone Call Date of discharge and from where: 05/09/2021 from [facility] How have you been since you were released from the hospital? Pt stated that  Any questions or concerns? No  Items Reviewed: Did the pt receive and understand the discharge instructions provided? Yes  Medications obtained and verified? Yes  Other? No  Any new allergies since your discharge? No  Dietary orders reviewed? No Do you have support at home? Yes   Functional Questionnaire: (I = Independent and D = Dependent) ADLs: I  Bathing/Dressing- I  Meal Prep- I  Eating- I  Maintaining continence- I  Transferring/Ambulation- I  Managing Meds- I   Follow up appointments reviewed:  PCP Hospital f/u appt confirmed? Yes  Scheduled to see Post COVID Care  on 05/15/2021 @ 10:00am. Specialist [facility] f/u appt confirmed? No   Are transportation arrangements needed? No  If their condition worsens, is the pt aware to call PCP or go to the Emergency Dept.? Yes Was the patient provided with contact information for the PCP's office or ED? Yes Was to pt encouraged to call back with questions or concerns? Yes

## 2021-05-15 ENCOUNTER — Telehealth (INDEPENDENT_AMBULATORY_CARE_PROVIDER_SITE_OTHER): Payer: Medicaid Other | Admitting: Nurse Practitioner

## 2021-05-15 DIAGNOSIS — U071 COVID-19: Secondary | ICD-10-CM

## 2021-05-15 MED ORDER — PREDNISONE 20 MG PO TABS: 20.0000 mg | ORAL_TABLET | Freq: Every day | ORAL | 0 refills | Status: AC

## 2021-05-15 NOTE — Patient Instructions (Signed)
Covid 19:   Stay well hydrated  Stay active  Deep breathing exercises  May take tylenol or fever or pain  May take mucinex DM twice daily  Will order prednisone     Follow up:  Follow up in 2 weeks or sooner if needed

## 2021-05-15 NOTE — Progress Notes (Signed)
Virtual Visit via Telephone Note  I connected with Melissa Guerra on 05/15/21 at 10:00 AM EDT by telephone and verified that I am speaking with the correct person using two identifiers.  Location:  Patient: home Provider: office   I discussed the limitations, risks, security and privacy concerns of performing an evaluation and management service by telephone and the availability of in person appointments. I also discussed with the patient that there may be a patient responsible charge related to this service. The patient expressed understanding and agreed to proceed.   History of Present Illness:  Patient presents today for post-COVID care clinic visit through televisit.  Patient tested positive for COVID on 05/09/2021.  Symptoms started on 05/07/2021.  Patient was seen in the ED and was prescribed Paxlovid.  Patient states that she is still having some ongoing symptoms of shortness of breath, nausea, headache, fatigue.  She has been an active.  She was encouraged to become more active and do deep breathing exercises and stay well-hydrated. Denies f/c/s, hemoptysis, PND, chest pain or edema.   Observations/Objective:  Vitals with BMI 05/10/2021 05/09/2021 03/19/2021  Height - - 5' 2.5"  Weight - - 284 lbs  BMI - - 51.08  Systolic 135 136 756  Diastolic 88 71 64  Pulse 79 84 77      Assessment and Plan:  Covid 19:   Stay well hydrated  Stay active  Deep breathing exercises  May take tylenol or fever or pain  May take mucinex DM twice daily  Will order prednisone     Follow up:  Follow up in 2 weeks or sooner if needed     I discussed the assessment and treatment plan with the patient. The patient was provided an opportunity to ask questions and all were answered. The patient agreed with the plan and demonstrated an understanding of the instructions.   The patient was advised to call back or seek an in-person evaluation if the symptoms worsen or if the condition  fails to improve as anticipated.  I provided 23 minutes of non-face-to-face time during this encounter.   Ivonne Andrew, NP

## 2021-05-22 ENCOUNTER — Ambulatory Visit: Payer: Medicaid Other

## 2021-05-25 ENCOUNTER — Encounter: Payer: Self-pay | Admitting: Nurse Practitioner

## 2021-05-25 NOTE — Progress Notes (Signed)
   [facility] [address] [place], [place]  [address] Phone:  [phone]   Fax:  [phone] 

## 2021-05-28 ENCOUNTER — Encounter: Payer: Self-pay | Admitting: Nurse Practitioner

## 2021-05-28 NOTE — Progress Notes (Signed)
   [employer] Patient [facility] [address] [place], [place]  [address] Phone:  [phone]   Fax:  [phone]  Patient walked in for updated letter.

## 2021-06-21 ENCOUNTER — Other Ambulatory Visit: Payer: Self-pay | Admitting: Nurse Practitioner

## 2021-06-21 MED ORDER — TRIAMCINOLONE ACETONIDE 0.1 % EX CREA: 1.0000 "application " | TOPICAL_CREAM | Freq: Two times a day (BID) | CUTANEOUS | 0 refills | Status: AC

## 2021-07-16 ENCOUNTER — Ambulatory Visit: Payer: Medicaid Other

## 2021-08-23 ENCOUNTER — Emergency Department (HOSPITAL_COMMUNITY)
Admission: EM | Admit: 2021-08-23 | Discharge: 2021-08-24 | Disposition: A | Discharge status: Home / Self Care | Payer: Medicaid Other | Attending: Emergency Medicine | Admitting: Emergency Medicine

## 2021-08-23 ENCOUNTER — Encounter (HOSPITAL_COMMUNITY): Payer: Self-pay | Admitting: Emergency Medicine

## 2021-08-23 ENCOUNTER — Other Ambulatory Visit: Payer: Self-pay

## 2021-08-23 ENCOUNTER — Emergency Department (HOSPITAL_COMMUNITY): Payer: Medicaid Other

## 2021-08-23 ENCOUNTER — Ambulatory Visit: Payer: Medicaid Other

## 2021-08-23 DIAGNOSIS — Z20822 Contact with and (suspected) exposure to covid-19: Secondary | ICD-10-CM | POA: Insufficient documentation

## 2021-08-23 DIAGNOSIS — Z87891 Personal history of nicotine dependence: Secondary | ICD-10-CM | POA: Insufficient documentation

## 2021-08-23 DIAGNOSIS — Z91010 Allergy to peanuts: Secondary | ICD-10-CM | POA: Diagnosis not present

## 2021-08-23 DIAGNOSIS — J45909 Unspecified asthma, uncomplicated: Secondary | ICD-10-CM | POA: Insufficient documentation

## 2021-08-23 DIAGNOSIS — R0600 Dyspnea, unspecified: Secondary | ICD-10-CM | POA: Diagnosis not present

## 2021-08-23 DIAGNOSIS — J019 Acute sinusitis, unspecified: Secondary | ICD-10-CM

## 2021-08-23 DIAGNOSIS — J329 Chronic sinusitis, unspecified: Secondary | ICD-10-CM | POA: Insufficient documentation

## 2021-08-23 DIAGNOSIS — R9431 Abnormal electrocardiogram [ECG] [EKG]: Secondary | ICD-10-CM | POA: Diagnosis not present

## 2021-08-23 DIAGNOSIS — R0981 Nasal congestion: Secondary | ICD-10-CM | POA: Diagnosis present

## 2021-08-23 DIAGNOSIS — Z8616 Personal history of COVID-19: Secondary | ICD-10-CM | POA: Insufficient documentation

## 2021-08-23 LAB — RESP PANEL BY RT-PCR (FLU A&B, COVID) ARPGX2
Influenza A by PCR: NEGATIVE
Influenza B by PCR: NEGATIVE
SARS Coronavirus 2 by RT PCR: NEGATIVE

## 2021-08-23 NOTE — ED Triage Notes (Addendum)
Pt c/o headache, sinus congestion, and chest wall pain x 2 days. Denies fevers.

## 2021-08-23 NOTE — ED Provider Notes (Signed)
Emergency Medicine Provider Triage Evaluation Note  Melissa Guerra , a 40 y.o. female  was evaluated in triage.  Pt complains of rhinorrhea, congestion, sinus pressure, ear fullness, cough, chest tightness, shortness of breath.  Symptoms started about 2 days ago.  Patient states she has been vaccinated for COVID-19 x2 and also notes a previous COVID-19 infection in June of this year.  Physical Exam  BP (!) 127/59 (BP Location: Right Arm)   Pulse 81   Temp 98.4 F (36.9 C) (Oral)   Resp 16   Ht 5\' 2"  (1.575 m)   Wt 122.5 kg   SpO2 100%   BMI 49.38 kg/m  Gen:   Awake, no distress   Resp:  Normal effort  MSK:   Moves extremities without difficulty  Other:    Medical Decision Making  Medically screening exam initiated at 7:53 PM.  Appropriate orders placed.  Melissa Guerra was informed that the remainder of the evaluation will be completed by another provider, this initial triage assessment does not replace that evaluation, and the importance of remaining in the ED until their evaluation is complete.   Melissa Corwin, PA-C 08/23/21 1954    10/23/21, MD 09/03/21 1530

## 2021-08-24 MED ORDER — AZITHROMYCIN 250 MG PO TABS
500.0000 mg | ORAL_TABLET | Freq: Once | ORAL | Status: AC
  Administered 2021-08-24: 500 mg via ORAL
  Filled 2021-08-24: qty 2

## 2021-08-24 MED ORDER — AZITHROMYCIN 250 MG PO TABS: 250.0000 mg | ORAL_TABLET | Freq: Every day | ORAL | 0 refills | Status: DC

## 2021-08-24 NOTE — Discharge Instructions (Addendum)
Begin taking Zithromax as prescribed.  Take over-the-counter medications as needed for symptom relief.  Return to the emergency department for severe chest pain, difficulty breathing, or other new and concerning symptoms.

## 2021-08-24 NOTE — ED Provider Notes (Signed)
[facility]-EMERGENCY DEPT Provider Note   CSN: [id number] Arrival date & time: 08/23/21  1922     History No chief complaint on file.   Melissa Guerra is a 40 y.o. female.  Patient is a 40 year old female with past medical history of reflux and asthma.  She presents today for evaluation of nasal congestion, sinus pressure, postnasal drip, and productive cough.  This has been worsening over the past week, but much worse over the past 3 days.  She denies fevers or chills.  She denies COVID exposures.  She has tried over-the-counter medicines with little relief.  She also describes pressure to her left ear, but no hearing loss.  The history is provided by the patient.      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    Past Surgical History:  Procedure Laterality Date   FOOT SURGERY     TONSILLECTOMY     TONSILLECTOMY AND ADENOIDECTOMY     TONSILLECTOMY AND ADENOIDECTOMY     WISDOM TOOTH EXTRACTION       OB History     Gravida  2   Para  2   Term      Preterm      AB      Living         SAB      IAB      Ectopic      Multiple      Live Births              Family History  Problem Relation Age of Onset   Cancer Mother    HIV Mother    Diabetes Maternal Grandmother    Diabetes Paternal Grandmother    Heart failure Father     Social History   Tobacco Use   Smoking status: Former    Types: Cigarettes   Smokeless tobacco: Never  Vaping Use   Vaping Use: Never used  Substance Use Topics   Alcohol  use: No   Drug use: No    Home Medications Prior to Admission medications   Medication Sig Start Date End Date Taking? Authorizing Provider  albuterol (PROVENTIL) (2.5 MG/3ML) 0.083% nebulizer solution Take 3 mLs (2.5 mg total) by nebulization every 6 (six) hours as needed for wheezing or shortness of breath. 05/03/21   Barbette Merino, NP  albuterol (VENTOLIN HFA) 108 (90 Base) MCG/ACT inhaler Inhale 2 puffs into the lungs every 6 (six) hours as needed for wheezing or shortness of breath. 04/16/21   Barbette Merino, NP  benzonatate (TESSALON) 100 MG capsule Take 1 capsule (100 mg total) by mouth every 8 (eight) hours. 05/10/21   Petrucelli, Pleas Koch, PA-C  ferrous gluconate (FERGON) 324 MG tablet Take 1 tablet (324 mg total) by mouth daily with breakfast. 11/09/20 11/09/21  Barbette Merino, NP  fluticasone (FLONASE) 50 MCG/ACT nasal spray Place 2 sprays into both nostrils daily. 10/23/20   Barbette Merino, NP  ibuprofen (ADVIL,MOTRIN) 200 MG tablet Take  400 mg by mouth every 6 (six) hours as needed for headache or mild pain.    [provider]  levocetirizine (XYZAL) 5 MG tablet Take 1 tablet (5 mg total) by mouth every evening. 03/21/21 03/21/22  Barbette Merino, NP  triamcinolone cream (KENALOG) 0.1 % Apply 1 application topically 2 (two) times daily. Apply for 2 weeks. May use on face 06/21/21   Barbette Merino, NP    Allergies    Codeine, Peanut allergen powder-dnfp, Peanut-containing drug products, and Shrimp [shellfish allergy]  Review of Systems   Review of Systems  All other systems reviewed and are negative.  Physical Exam Updated Vital Signs BP 136/80 (BP Location: Right Arm)   Pulse 77   Temp 98.4 F (36.9 C) (Oral)   Resp 16   Ht 5\' 2"  (1.575 m)   Wt 127.4 kg   SpO2 100%   BMI 51.36 kg/m   Physical Exam Vitals and nursing note reviewed.  Constitutional:      General: She is not in acute distress.    Appearance: She is well-developed. She is not diaphoretic.   HENT:     Head: Normocephalic and atraumatic.     Comments: There is pressure noted to the frontal sinuses.    Right Ear: Tympanic membrane normal.     Left Ear: Tympanic membrane normal.     Nose: Congestion present. No rhinorrhea.  Cardiovascular:     Rate and Rhythm: Normal rate and regular rhythm.     Heart sounds: No murmur heard.   No friction rub. No gallop.  Pulmonary:     Effort: Pulmonary effort is normal. No respiratory distress.     Breath sounds: Normal breath sounds. No wheezing.  Abdominal:     General: Bowel sounds are normal. There is no distension.     Palpations: Abdomen is soft.     Tenderness: There is no abdominal tenderness.  Musculoskeletal:        General: Normal range of motion.     Cervical back: Normal range of motion and neck supple.  Skin:    General: Skin is warm and dry.  Neurological:     General: No focal deficit present.     Mental Status: She is alert and oriented to person, place, and time.    ED Results / Procedures / Treatments   Labs (all labs ordered are listed, but only abnormal results are displayed) Labs Reviewed  RESP PANEL BY RT-PCR (FLU A&B, COVID) [id number]    EKG None  Radiology DG Chest 2 View  Result Date: 08/23/2021 CLINICAL DATA:  Dyspnea EXAM: CHEST - 2 VIEW COMPARISON:  05/09/2021 FINDINGS: Lungs are well expanded, symmetric, and clear. No pneumothorax or pleural effusion. Cardiac size within normal limits. Pulmonary vascularity is normal. Osseous structures are age-appropriate. No acute bone abnormality. IMPRESSION: No active cardiopulmonary disease. Electronically Signed   By: 05/11/2021 M.D.   On: 08/23/2021 20:14    Procedures Procedures   Medications Ordered in ED Medications  azithromycin (ZITHROMAX) tablet 500 mg (has no administration in time range)    ED Course  I have reviewed the triage vital signs and the nursing notes.  Pertinent labs & imaging results that were available during my care of the  patient were reviewed by me and considered in my medical decision making (see chart for details).    MDM Rules/Calculators/A&P  Will treat for sinusitis based on symptoms and clinical findings.  Patient to return as needed.  Final  Clinical Impression(s) / ED Diagnoses Final diagnoses:  None    Rx / DC Orders ED Discharge Orders     None        Geoffery Lyons, MD 08/24/21 [phone]

## 2021-08-27 ENCOUNTER — Telehealth: Payer: Self-pay

## 2021-08-27 NOTE — Telephone Encounter (Signed)
Transition Care Management Follow-up Telephone Call Date of discharge and from where: 08/24/2021 from [facility] How have you been since you were released from the hospital? Pt stated that she is feeling okay. Pt is trying to get in with her PCP and is waiting on a return phone call.  Any questions or concerns? No  Items Reviewed: Did the pt receive and understand the discharge instructions provided? Yes  Medications obtained and verified? Yes  Other? No  Any new allergies since your discharge? No  Dietary orders reviewed? No Do you have support at home? Yes   Functional Questionnaire: (I = Independent and D = Dependent) ADLs: I  Bathing/Dressing- I  Meal Prep- I  Eating- I  Maintaining continence- I  Transferring/Ambulation- I  Managing Meds- I   Follow up appointments reviewed:  PCP Hospital f/u appt confirmed? No   Specialist Hospital f/u appt confirmed? No   Are transportation arrangements needed? No  If their condition worsens, is the pt aware to call PCP or go to the Emergency Dept.? Yes Was the patient provided with contact information for the PCP's office or ED? Yes Was to pt encouraged to call back with questions or concerns? Yes

## 2021-09-13 DIAGNOSIS — J4520 Mild intermittent asthma, uncomplicated: Secondary | ICD-10-CM | POA: Insufficient documentation

## 2021-09-14 ENCOUNTER — Encounter: Payer: Self-pay | Admitting: Nurse Practitioner

## 2021-09-14 ENCOUNTER — Other Ambulatory Visit: Payer: Self-pay

## 2021-09-14 ENCOUNTER — Ambulatory Visit (INDEPENDENT_AMBULATORY_CARE_PROVIDER_SITE_OTHER): Payer: Medicaid Other | Admitting: Nurse Practitioner

## 2021-09-14 DIAGNOSIS — J988 Other specified respiratory disorders: Secondary | ICD-10-CM

## 2021-09-14 DIAGNOSIS — R102 Pelvic and perineal pain: Secondary | ICD-10-CM

## 2021-09-14 DIAGNOSIS — F4321 Adjustment disorder with depressed mood: Secondary | ICD-10-CM

## 2021-09-14 DIAGNOSIS — R1084 Generalized abdominal pain: Secondary | ICD-10-CM

## 2021-09-14 MED ORDER — PREDNISONE 20 MG PO TABS: 40.0000 mg | ORAL_TABLET | Freq: Every day | ORAL | 0 refills | Status: AC

## 2021-09-14 NOTE — Patient Instructions (Signed)
Upper Respiratory Infection, Adult  An upper respiratory infection (URI) affects the nose, throat, and upper air passages. URIs are caused by germs (viruses). The most common type of URI is often called "the common cold."  Medicines cannot cure URIs, but you can do things at home to relieve your symptoms. URIs usually get better within 7-10 days.  Follow these instructions at home:  Activity  Rest as needed.  If you have a fever, stay home from work or school until your fever is gone, or until your doctor says you may return to work or school.  You should stay home until you cannot spread the infection anymore (you are not contagious).  Your doctor may have you wear a face mask so you have less risk of spreading the infection.  Relieving symptoms  Gargle with a salt-water mixture 3-4 times a day or as needed. To make a salt-water mixture, completely dissolve -1 tsp of salt in 1 cup of warm water.  Use a cool-mist humidifier to add moisture to the air. This can help you breathe more easily.  Eating and drinking    Drink enough fluid to keep your pee (urine) pale yellow.  Eat soups and other clear broths.  General instructions    Take over-the-counter and prescription medicines only as told by your doctor. These include cold medicines, fever reducers, and cough suppressants.  Do not use any products that contain nicotine or tobacco. These include cigarettes and e-cigarettes. If you need help quitting, ask your doctor.  Avoid being where people are smoking (avoid secondhand smoke).  Make sure you get regular shots and get the flu shot every year.  Keep all follow-up visits as told by your doctor. This is important.  How to avoid spreading infection to others    Wash your hands often with soap and water. If you do not have soap and water, use hand sanitizer.  Avoid touching your mouth, face, eyes, or nose.  Cough or sneeze into a tissue or your sleeve or elbow. Do not cough or sneeze into your hand or into the  air.  Contact a doctor if:  You are getting worse, not better.  You have any of these:  A fever.  Chills.  Brown or red mucus in your nose.  Yellow or brown fluid (discharge)coming from your nose.  Pain in your face, especially when you bend forward.  Swollen neck glands.  Pain with swallowing.  White areas in the back of your throat.  Get help right away if:  You have shortness of breath that gets worse.  You have very bad or constant:  Headache.  Ear pain.  Pain in your forehead, behind your eyes, and over your cheekbones (sinus pain).  Chest pain.  You have long-lasting (chronic) lung disease along with any of these:  Wheezing.  Long-lasting cough.  Coughing up blood.  A change in your usual mucus.  You have a stiff neck.  You have changes in your:  Vision.  Hearing.  Thinking.  Mood.  Summary  An upper respiratory infection (URI) is caused by a germ called a virus. The most common type of URI is often called "the common cold."  URIs usually get better within 7-10 days.  Take over-the-counter and prescription medicines only as told by your doctor.  This information is not intended to replace advice given to you by your health care provider. Make sure you discuss any questions you have with your health care provider.  Document   Revised: 07/13/2020 Document Reviewed: 07/13/2020  Elsevier Patient Education  2022 [employer] Inc[employer]

## 2021-09-14 NOTE — Progress Notes (Signed)
   [facility] Patient [facility] [name], [place]  [address] Phone:  [phone]   Fax:  [phone] Virtual Visit via Telephone Note  I connected with Melissa Guerra on 09/14/21 at  2:00 PM EDT by telephone and verified that I am speaking with the correct person using two identifiers.   I discussed the limitations, risks, security and privacy concerns of performing an evaluation and management service by telephone and the availability of in person appointments. I also discussed with the patient that there may be a patient responsible charge related to this service. The patient expressed understanding and agreed to proceed.  Patient home Provider Office  History of Present Illness:  Melissa Guerra  has a past medical history of Abnormal Pap smear, Acid reflux, Asthma, and Obesity.   She was seen at the wellness center. She is having nasal congestion with clear to colored drainage which changes with the day. She is currently being treated for seasonal allergies and she has asthma. She was seen in the ED and treated with Zpac at the beginning of October. She was tested for flu and COVID which were both negative.    She is having abdominal pain She reports that it varies from side to side. She also has pain in her pelvis. Denies any  amenorrhea irregular bleeding or prolonged heavy bleeding.  Denies vaginal discharge or dysuria.  Denies ulcers or lesions   She has lost 7 family members. She just left a funeral today. She is wanting to have some counseling. She is having to call out of work due to her ongoing pain and symptoms. She is now needed FMLA paperwork completed.   Review of Systems  Constitutional:  Positive for chills and malaise/fatigue.  HENT:  Positive for congestion.   Gastrointestinal:  Positive for abdominal pain.    Observations/Objective: No exam; telephone visit  Assessment and Plan: 1. Generalized abdominal pain - US Abdomen Complete; Future  2.  Pelvic pain - US Pelvic Complete With Transvaginal; Future  3. Grieving - Ambulatory referral to Psychiatry  4. Congestion of respiratory tract Continue with Mucinex and daily medication regimen - predniSONE (DELTASONE) 20 MG tablet; Take 2 tablets (40 mg total) by mouth daily with breakfast for 7 days.  Dispense: 14 tablet; Refill: 0   Follow Up Instructions: Appt as scheduled   I discussed the assessment and treatment plan with the patient. The patient was provided an opportunity to ask questions and all were answered. The patient agreed with the plan and demonstrated an understanding of the instructions.   The patient was advised to call back or seek an in-person evaluation if the symptoms worsen or if the condition fails to improve as anticipated.  I provided 22 minutes of telephone- visit time during this encounter.   Barbette Merino, NP

## 2021-09-20 ENCOUNTER — Ambulatory Visit (HOSPITAL_COMMUNITY): Admission: RE | Admit: 2021-09-20 | Payer: Medicaid Other | Source: Ambulatory Visit

## 2021-09-20 ENCOUNTER — Encounter: Payer: Self-pay | Admitting: Nurse Practitioner

## 2021-09-24 NOTE — Telephone Encounter (Signed)
Forms were faxed

## 2021-10-08 ENCOUNTER — Ambulatory Visit (INDEPENDENT_AMBULATORY_CARE_PROVIDER_SITE_OTHER): Payer: Medicaid Other | Admitting: Nurse Practitioner

## 2021-10-08 ENCOUNTER — Other Ambulatory Visit: Payer: Self-pay

## 2021-10-08 VITALS — BP 133/68 | HR 79 | Temp 98.2°F | Wt 281.1 lb

## 2021-10-08 DIAGNOSIS — J4520 Mild intermittent asthma, uncomplicated: Secondary | ICD-10-CM | POA: Diagnosis not present

## 2021-10-08 DIAGNOSIS — Z6841 Body Mass Index (BMI) 40.0 and over, adult: Secondary | ICD-10-CM | POA: Diagnosis not present

## 2021-10-08 DIAGNOSIS — R1084 Generalized abdominal pain: Secondary | ICD-10-CM | POA: Diagnosis not present

## 2021-10-08 LAB — POCT URINALYSIS DIP (CLINITEK)
Bilirubin, UA: NEGATIVE
Glucose, UA: NEGATIVE mg/dL
Ketones, POC UA: NEGATIVE mg/dL
Leukocytes, UA: NEGATIVE
Nitrite, UA: NEGATIVE
POC PROTEIN,UA: NEGATIVE
Spec Grav, UA: >=1.03 — AB (ref 1.010–1.025)
Urobilinogen, UA: 0.2 E.U./dL
pH, UA: 6.5 (ref 5.0–8.0)

## 2021-10-08 MED ORDER — MONTELUKAST SODIUM 10 MG PO TABS: 10.0000 mg | ORAL_TABLET | Freq: Every day | ORAL | 3 refills | Status: AC

## 2021-10-08 NOTE — Progress Notes (Signed)
[place], [place]  [address] Phone:  [phone]   Fax:  [phone]    Established Patient Office Visit  Subjective:  Patient ID: Melissa Guerra, female    DOB: 1980-12-20  Age: 40 y.o. MRN: [id number]  CC:  Chief Complaint  Patient presents with   Follow-up    Pt  has a lot of pain on her right side.pt states menstrual is irregular has a lot of blood clots when on cycle pt said she is not sure if she has fibroids pt still has SOB.    HPI Melissa Guerra presents for follow up. She  has a past medical history of Abnormal Pap smear, Acid reflux, Asthma, and Obesity.   She continues to have abdominal and pelvic pain. She is to have a ultrasound tomorrow.   She is following up on for asthma. She is prescribed albuterol and Singulair, She is currently using this as directed, She does complain of SOB. Denies headache, dizziness, visual changes, chest pain, nausea, vomiting or any edema. She has had COVID on 2 occasions in the last year. She continues to have problems getting paid from her job related to being out of work for [employer]  She is requesting a Astronomer for work due to having to walk. She feels like this is needed for her left ankle and foot OA.      Past Medical History:  Diagnosis Date   Abnormal Pap smear    Acid reflux    Asthma    Obesity     Past Surgical History:  Procedure Laterality Date   FOOT SURGERY     TONSILLECTOMY     TONSILLECTOMY AND ADENOIDECTOMY     TONSILLECTOMY AND ADENOIDECTOMY     WISDOM TOOTH EXTRACTION      Family History  Problem Relation Age of Onset   Cancer Mother    HIV Mother    Diabetes Maternal Grandmother    Diabetes Paternal Grandmother    Heart failure Father     Social History   Socioeconomic History   Marital status: Single    Spouse name: Not on file   Number of children: Not on file   Years of education: Not on file   Highest education level: Not on file   Occupational History   Not on file  Tobacco Use   Smoking status: Former    Types: Cigarettes   Smokeless tobacco: Never  Vaping Use   Vaping Use: Never used  Substance and Sexual Activity   Alcohol use: No   Drug use: No   Sexual activity: Yes    Birth control/protection: Other-see comments    Comment: female partner  Other Topics Concern   Not on file  Social History Narrative   Not on file   Social Determinants of Health   Financial Resource Strain: Not on file  Food Insecurity: Not on file  Transportation Needs: Not on file  Physical Activity: Not on file  Stress: Not on file  Social Connections: Not on file  Intimate Partner Violence: Not on file    Outpatient Medications Prior to Visit  Medication Sig Dispense Refill   albuterol (PROVENTIL) (2.5 MG/3ML) 0.083% nebulizer solution Take 3 mLs (2.5 mg total) by nebulization every 6 (six) hours as needed for wheezing or shortness of breath. 75 mL 12   albuterol (VENTOLIN HFA) 108 (90 Base) MCG/ACT inhaler Inhale 2 puffs into the lungs every 6 (six)  hours as needed for wheezing or shortness of breath. 8 g 11   ferrous gluconate (FERGON) 324 MG tablet Take 1 tablet (324 mg total) by mouth daily with breakfast. 90 tablet 3   fluticasone (FLONASE) 50 MCG/ACT nasal spray Place 2 sprays into both nostrils daily. 1 g 5   ibuprofen (ADVIL,MOTRIN) 200 MG tablet Take 400 mg by mouth every 6 (six) hours as needed for headache or mild pain.     levocetirizine (XYZAL) 5 MG tablet Take 1 tablet (5 mg total) by mouth every evening. 90 tablet 3   triamcinolone cream (KENALOG) 0.1 % Apply 1 application topically 2 (two) times daily. Apply for 2 weeks. May use on face 30 g 0   benzonatate (TESSALON) 100 MG capsule Take 1 capsule (100 mg total) by mouth every 8 (eight) hours. (Patient not taking: Reported on 10/08/2021) 21 capsule 0   montelukast (SINGULAIR) 10 MG tablet Take 10 mg by mouth daily.     No facility-administered medications  prior to visit.    Allergies  Allergen Reactions   Codeine Anaphylaxis and Other (See Comments)    Patient was hospitalized    Peanut Allergen Powder-Dnfp    Peanut-Containing Drug Products Hives and Itching   Shrimp [Shellfish Allergy]     ROS Review of Systems    Objective:    Physical Exam Constitutional:      Appearance: She is obese.  HENT:     Head: Normocephalic and atraumatic.  Cardiovascular:     Rate and Rhythm: Normal rate and regular rhythm.     Pulses: Normal pulses.     Heart sounds: Normal heart sounds.  Pulmonary:     Effort: Pulmonary effort is normal.     Breath sounds: Normal breath sounds.  Abdominal:     Palpations: Abdomen is soft.     Comments: Increased abdominal girth Hypoactive  Musculoskeletal:        General: Normal range of motion.     Cervical back: Normal range of motion.  Skin:    General: Skin is warm and dry.     Capillary Refill: Capillary refill takes less than 2 seconds.  Neurological:     General: No focal deficit present.     Mental Status: She is alert and oriented to person, place, and time.  Psychiatric:        Mood and Affect: Mood normal.        Behavior: Behavior normal.        Thought Content: Thought content normal.        Judgment: Judgment normal.   BP 133/68   Pulse 79   Temp 98.2 F (36.8 C)   Wt 281 lb 2 oz (127.5 kg)   SpO2 100%   BMI 51.42 kg/m  Wt Readings from Last 3 Encounters:  10/08/21 281 lb 2 oz (127.5 kg)  08/23/21 280 lb 12.8 oz (127.4 kg)  03/19/21 284 lb (128.8 kg)     Health Maintenance Due  Topic Date Due   COVID-19 Vaccine (1) Never done   INFLUENZA VACCINE  Never done    There are no preventive care reminders to display for this patient.  Lab Results  Component Value Date   TSH 1.510 10/20/2020   Lab Results  Component Value Date   WBC 5.7 10/20/2020   HGB 9.3 (L) 10/20/2020   HCT 29.8 (L) 10/20/2020   MCV 76 (L) 10/20/2020   PLT 337 10/20/2020   Lab Results   Component Value  Date   NA 140 03/19/2021   K 3.9 03/19/2021   CO2 27 09/23/2020   GLUCOSE 79 03/19/2021   BUN 9 03/19/2021   CREATININE 0.60 03/19/2021   BILITOT 0.4 03/19/2021   ALKPHOS 83 03/19/2021   AST 15 03/19/2021   ALT 17 09/23/2020   PROT 6.4 03/19/2021   ALBUMIN 4.0 03/19/2021   CALCIUM 8.5 (L) 03/19/2021   ANIONGAP 4 (L) 09/23/2020   EGFR 116 03/19/2021   Lab Results  Component Value Date   CHOL 122 03/19/2021   Lab Results  Component Value Date   HDL 60 03/19/2021   Lab Results  Component Value Date   LDLCALC 48 03/19/2021   Lab Results  Component Value Date   TRIG 70 03/19/2021   Lab Results  Component Value Date   CHOLHDL 2.0 03/19/2021   Lab Results  Component Value Date   HGBA1C 5.3 10/20/2020      Assessment & Plan:   Problem List Items Addressed This Visit       Respiratory   Mild intermittent asthma without complication Stable   Relevant Medications   montelukast (SINGULAIR) 10 MG tablet   Other Visit Diagnoses     Generalized abdominal pain    -  Primary Persistent Evaluation pending   Relevant Orders   POCT URINALYSIS DIP (CLINITEK) (Completed)   Class 3 severe obesity due to excess calories without serious comorbidity with body mass index (BMI) of 50.0 to 59.9 in adult ([place])     Obesity with BMI and comorbidities as noted above.  Discussed proper diet (low fat, low sodium, high fiber) with patient.   Discussed need for regular exercise (3 times per week, 20 minutes per session) with patient.        Meds ordered this encounter  Medications   montelukast (SINGULAIR) 10 MG tablet    Sig: Take 1 tablet (10 mg total) by mouth at bedtime.    Dispense:  90 tablet    Refill:  3    Order Specific Question:   Supervising Provider    Answer:   Tresa Garter [id number]    Follow-up: Return in about 3 months (around 01/08/2022) for asthma.    Vevelyn Francois, NP

## 2021-10-08 NOTE — Patient Instructions (Addendum)
[facility]  Scot Jun, PA-C  [address] [place], [place] [address]    Uterine Fibroids Uterine fibroids are lumps of tissue (tumors) in the womb (uterus). Fibroids are not cancerous. Most women with this condition do not need treatment. Sometimes, fibroids can make it harder to have children. If this happens, you may need surgery to take out the fibroids. What are the causes? The cause of this condition is not known. What increases the risk? You are in your 30s or 40s and have not gone through menopause. Menopause is when you have not had a menstrual period for 12 months. Having a history of fibroids in your family. You are of African American descent. You started your period at age 72 or younger. You have not given birth. You are overweight or very overweight. What are the signs or symptoms? Bleeding between menstrual periods. Heavy bleeding during your menstrual period. Pain in the area between your hips. Needing to pee (urinate) right away or more often than usual. Not being able to have children (infertility). Not being able to stay pregnant (miscarriage). Many women do not have symptoms.  How is this treated? Treatment may include: Follow-up visits with your doctor to check your fibroids for any changes. Medicines to help with pain, such as aspirin or ibuprofen. Hormone therapy. This may be given as a pill, in a shot, or with a type of birth control device called an IUD. Surgery that would do one of these things: Take out the fibroids. This may be done if you want to become pregnant. Take out the womb (hysterectomy). Stop the blood flow to the fibroids. Follow these instructions at home: Medicines Take over-the-counter and prescription medicines only as told by your doctor. Ask your doctor if you should: Take iron pills. Eat more foods that have a lot of iron in them, such as dark green, leafy vegetables. Managing pain If told, put heat on your back  or belly. Do this as often as told by your doctor. Use the heat source that your doctor recommends, such as a moist heat pack or a heating pad. To do this: Put a towel between your skin and the heat pack or pad. Leave the heat on for 20-30 minutes. Take off the heat if your skin turns bright red. This is very important. If you cannot feel pain, heat, or cold, you may have a greater risk of getting burned.  General instructions Tell your doctor about any changes to your menstrual period, such as: Heavy bleeding that needs a change of tampons or pads more than normal. A change in how many days your period lasts. A change in symptoms that come with your period. This might be belly cramps or back pain. Keep all follow-up visits. Contact a doctor if: You have pain that does not get better with medicine or heat. This may include pain or cramps in: The area between your hip bones. Your back. Your belly. You have new bleeding between your periods. You have more bleeding during or between your periods. You feel very tired or weak. You feel dizzy. Get help right away if: You faint. You have pain in the area between your hip bones that gets worse. You have bleeding that soaks a tampon or pad in 30 minutes or less. Summary Uterine fibroids are lumps of tissue (tumors) in your womb. They are not cancerous. Medicines such as aspirin or ibuprofen may be used to help with pain. Contact a doctor if you  have pain or cramps that do not get better with medicine. Know the symptoms for when you should get help right away. This information is not intended to replace advice given to you by your health care provider. Make sure you discuss any questions you have with your health care provider. Document Revised: 06/06/2020 Document Reviewed: 06/06/2020 Elsevier Patient Education  2022 [employer].

## 2021-10-09 ENCOUNTER — Ambulatory Visit (HOSPITAL_COMMUNITY)
Admission: RE | Admit: 2021-10-09 | Discharge: 2021-10-09 | Disposition: A | Discharge status: Home / Self Care | Payer: 59 | Source: Ambulatory Visit | Attending: Nurse Practitioner | Admitting: Nurse Practitioner

## 2021-10-09 DIAGNOSIS — R102 Pelvic and perineal pain: Secondary | ICD-10-CM

## 2021-10-09 DIAGNOSIS — R1084 Generalized abdominal pain: Secondary | ICD-10-CM | POA: Insufficient documentation

## 2021-10-10 ENCOUNTER — Telehealth: Payer: Self-pay | Admitting: Nurse Practitioner

## 2021-10-10 ENCOUNTER — Other Ambulatory Visit: Payer: Self-pay | Admitting: Nurse Practitioner

## 2021-10-10 DIAGNOSIS — R1900 Intra-abdominal and pelvic swelling, mass and lump, unspecified site: Secondary | ICD-10-CM

## 2021-10-10 NOTE — Progress Notes (Signed)
Urine trace blood  Repeat UA in 2 weeks Hydrate with water and 100% cranberry juice.  [place]  Focal mass at upper uterine segment endometrial canal 17 x 13 x 21 mm question polyp versus tumor; consider sonohysterogram for further Evaluation  Order for sonohysterogram placed thanks

## 2021-10-10 NOTE — Telephone Encounter (Signed)
Patient requesting urine test results and ultrasound results. She saw they are available on MyChart, but would like an explanation to understand their meaning.

## 2021-10-15 NOTE — Telephone Encounter (Signed)
Voicemail full, unable to reach patient.

## 2021-10-16 NOTE — Addendum Note (Signed)
Addended by: Eduard Clos on: 10/16/2021 11:06 AM   Modules accepted: Orders

## 2021-10-30 ENCOUNTER — Encounter: Payer: Self-pay | Admitting: Nurse Practitioner

## 2021-10-30 NOTE — Telephone Encounter (Signed)
Patient notified, stated she will pick up on Thursday.

## 2021-11-01 ENCOUNTER — Other Ambulatory Visit: Payer: Self-pay

## 2021-11-01 ENCOUNTER — Ambulatory Visit: Payer: Medicaid Other | Admitting: Nurse Practitioner

## 2021-11-01 DIAGNOSIS — R3989 Other symptoms and signs involving the genitourinary system: Secondary | ICD-10-CM

## 2021-11-01 NOTE — Progress Notes (Signed)
Patient in to give urine sample for urine analysis.

## 2021-11-25 ENCOUNTER — Ambulatory Visit
Admission: EM | Admit: 2021-11-25 | Discharge: 2021-11-25 | Disposition: A | Discharge status: Home / Self Care | Payer: Medicaid Other | Attending: Physician Assistant | Admitting: Physician Assistant

## 2021-11-25 ENCOUNTER — Encounter: Payer: Self-pay | Admitting: Emergency Medicine

## 2021-11-25 ENCOUNTER — Other Ambulatory Visit: Payer: Self-pay

## 2021-11-25 DIAGNOSIS — J069 Acute upper respiratory infection, unspecified: Secondary | ICD-10-CM | POA: Diagnosis not present

## 2021-11-25 DIAGNOSIS — Z20822 Contact with and (suspected) exposure to covid-19: Secondary | ICD-10-CM | POA: Diagnosis not present

## 2021-11-25 NOTE — ED Triage Notes (Signed)
Pt said having ear fullness and congestion in her head x 3 days., Pt said also having a little dry cough. Pt is fatigue feeling as well. No fevers.

## 2021-11-25 NOTE — ED Provider Notes (Signed)
[name]    CSN: [id number] Arrival date & time: 11/25/21  1012      History   Chief Complaint Chief Complaint  Patient presents with   Nasal Congestion   Ear Fullness    HPI Melissa Guerra is a 41 y.o. female.   Patient here today for evaluation of ear fullness and congestion that started 3 days ago. She reports cough is dry. She has felt fatigued. She does have known asthma. She denies fever.  The history is provided by the patient.  Ear Fullness Pertinent negatives include no chest pain, no abdominal pain and no shortness of breath.   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 Authorizing Provider  albuterol (PROVENTIL) (2.5 MG/3ML) 0.083% nebulizer solution Take 3 mLs (2.5 mg total) by nebulization every 6 (six) hours as needed for wheezing or shortness of breath. 05/03/21   Vevelyn Francois, NP  albuterol (VENTOLIN HFA) 108 (90 Base) MCG/ACT inhaler Inhale 2 puffs into the lungs every 6 (six) hours as  needed for wheezing or shortness of breath. 04/16/21   Vevelyn Francois, NP  benzonatate (TESSALON) 100 MG capsule Take 1 capsule (100 mg total) by mouth every 8 (eight) hours. Patient not taking: Reported on 10/08/2021 05/10/21   Petrucelli, Glynda Jaeger, PA-C  ferrous gluconate (FERGON) 324 MG tablet Take 1 tablet (324 mg total) by mouth daily with breakfast. 11/09/20 11/09/21  Vevelyn Francois, NP  fluticasone (FLONASE) 50 MCG/ACT nasal spray Place 2 sprays into both nostrils daily. 10/23/20   Vevelyn Francois, NP  ibuprofen (ADVIL,MOTRIN) 200 MG tablet Take 400 mg by mouth every 6 (six) hours as needed for headache or mild pain.    [provider]  levocetirizine (XYZAL) 5 MG tablet Take 1 tablet (5 mg total) by mouth every evening. 03/21/21 03/21/22  Vevelyn Francois, NP  montelukast (SINGULAIR) 10 MG tablet Take 1 tablet (10 mg total) by mouth at bedtime. 10/08/21 10/08/22  Vevelyn Francois, NP  triamcinolone cream (KENALOG) 0.1 % Apply 1 application topically 2 (two) times daily. Apply for 2 weeks. May use on  face 06/21/21   Vevelyn Francois, NP    Family History Family History  Problem Relation Age of Onset   Cancer Mother    HIV Mother    Diabetes Maternal Grandmother    Diabetes Paternal Grandmother    Heart failure Father     Social History Social History   Tobacco Use   Smoking status: Former    Types: Cigarettes   Smokeless tobacco: Never  Vaping Use   Vaping Use: Never used  Substance Use Topics   Alcohol use: No   Drug use: No     Allergies   Codeine, Peanut allergen powder-dnfp, Peanut-containing drug products, and Shrimp [shellfish allergy]   Review of Systems Review of Systems  Constitutional:  Negative for chills and fever.  HENT:  Positive for congestion. Negative for sore throat.   Respiratory:  Negative for shortness of breath.   Cardiovascular:  Negative for chest pain.  Gastrointestinal:  Negative for abdominal pain, nausea and vomiting.   Musculoskeletal:  Negative for back pain.    Physical Exam Triage Vital Signs ED Triage Vitals  Enc Vitals Group     BP 11/25/21 1115 (!) 140/99     Pulse Rate 11/25/21 1115 64     Resp 11/25/21 1115 16     Temp 11/25/21 1115 98.2 F (36.8 C)     Temp Source 11/25/21 1115 Oral     SpO2 11/25/21 1115 99 %     Weight --      Height --      Head Circumference --      Peak Flow --      Pain Score 11/25/21 1117 3     Pain Loc --      Pain Edu? --      Excl. in [place]? --    No data found.  Updated Vital Signs BP (!) 140/99 (BP Location: Right Arm)    Pulse 64    Temp 98.2 F (36.8 C) (Oral)    Resp 16    SpO2 99%       Physical Exam Vitals and nursing note reviewed.  Constitutional:      General: She is not in acute distress.    Appearance: Normal appearance. She is not ill-appearing.  HENT:     Head: Normocephalic and atraumatic.     Nose: Congestion present.  Eyes:     Conjunctiva/sclera: Conjunctivae normal.  Cardiovascular:     Rate and Rhythm: Normal rate and regular rhythm.     Heart sounds: Normal heart sounds. No murmur heard. Pulmonary:     Effort: Pulmonary effort is normal. No respiratory distress.     Breath sounds: Normal breath sounds. No wheezing, rhonchi or rales.  Abdominal:     General: Abdomen is flat.  Skin:    General: Skin is warm and dry.  Neurological:     Mental Status: She is alert.  Psychiatric:        Mood and Affect: Mood normal.        Thought Content: Thought content normal.     UC Treatments / Results  Labs (all labs ordered are listed, but only abnormal results are displayed) Labs Reviewed  NOVEL CORONAVIRUS, NAA    EKG   Radiology No results found.  Procedures Procedures (including critical care time)  Medications Ordered in UC Medications - No data to display  Initial Impression / Assessment and Plan / UC Course  I have reviewed the triage vital signs and the  nursing notes.  Pertinent labs & imaging results  that were available during my care of the patient were reviewed by me and considered in my medical decision making (see chart for details).    Suspect viral URI. Will order COVID screening.  Recommend symptomatic treatment in the meantime and encouraged follow-up with any further concerns.   Final Clinical Impressions(s) / UC Diagnoses   Final diagnoses:  Acute upper respiratory infection   Discharge Instructions   None    ED Prescriptions   None    PDMP not reviewed this encounter.   Francene Finders, PA-C 11/25/21 1337

## 2021-11-26 LAB — NOVEL CORONAVIRUS, NAA: SARS-CoV-2, NAA: NOT DETECTED

## 2021-11-26 LAB — SARS-COV-2, NAA 2 DAY TAT

## 2021-12-13 IMAGING — DX DG CHEST 1V PORT
1 series · 1 of 1 positions shown · non-contrast
Comparison: Portable exam 0644 hours compared to 05/06/2019

CLINICAL DATA: Nasal congestion, body aches, headache, cough,
patient believes she might have COVID, former smoker

EXAM:
PORTABLE CHEST 1 VIEW

[chest ap]
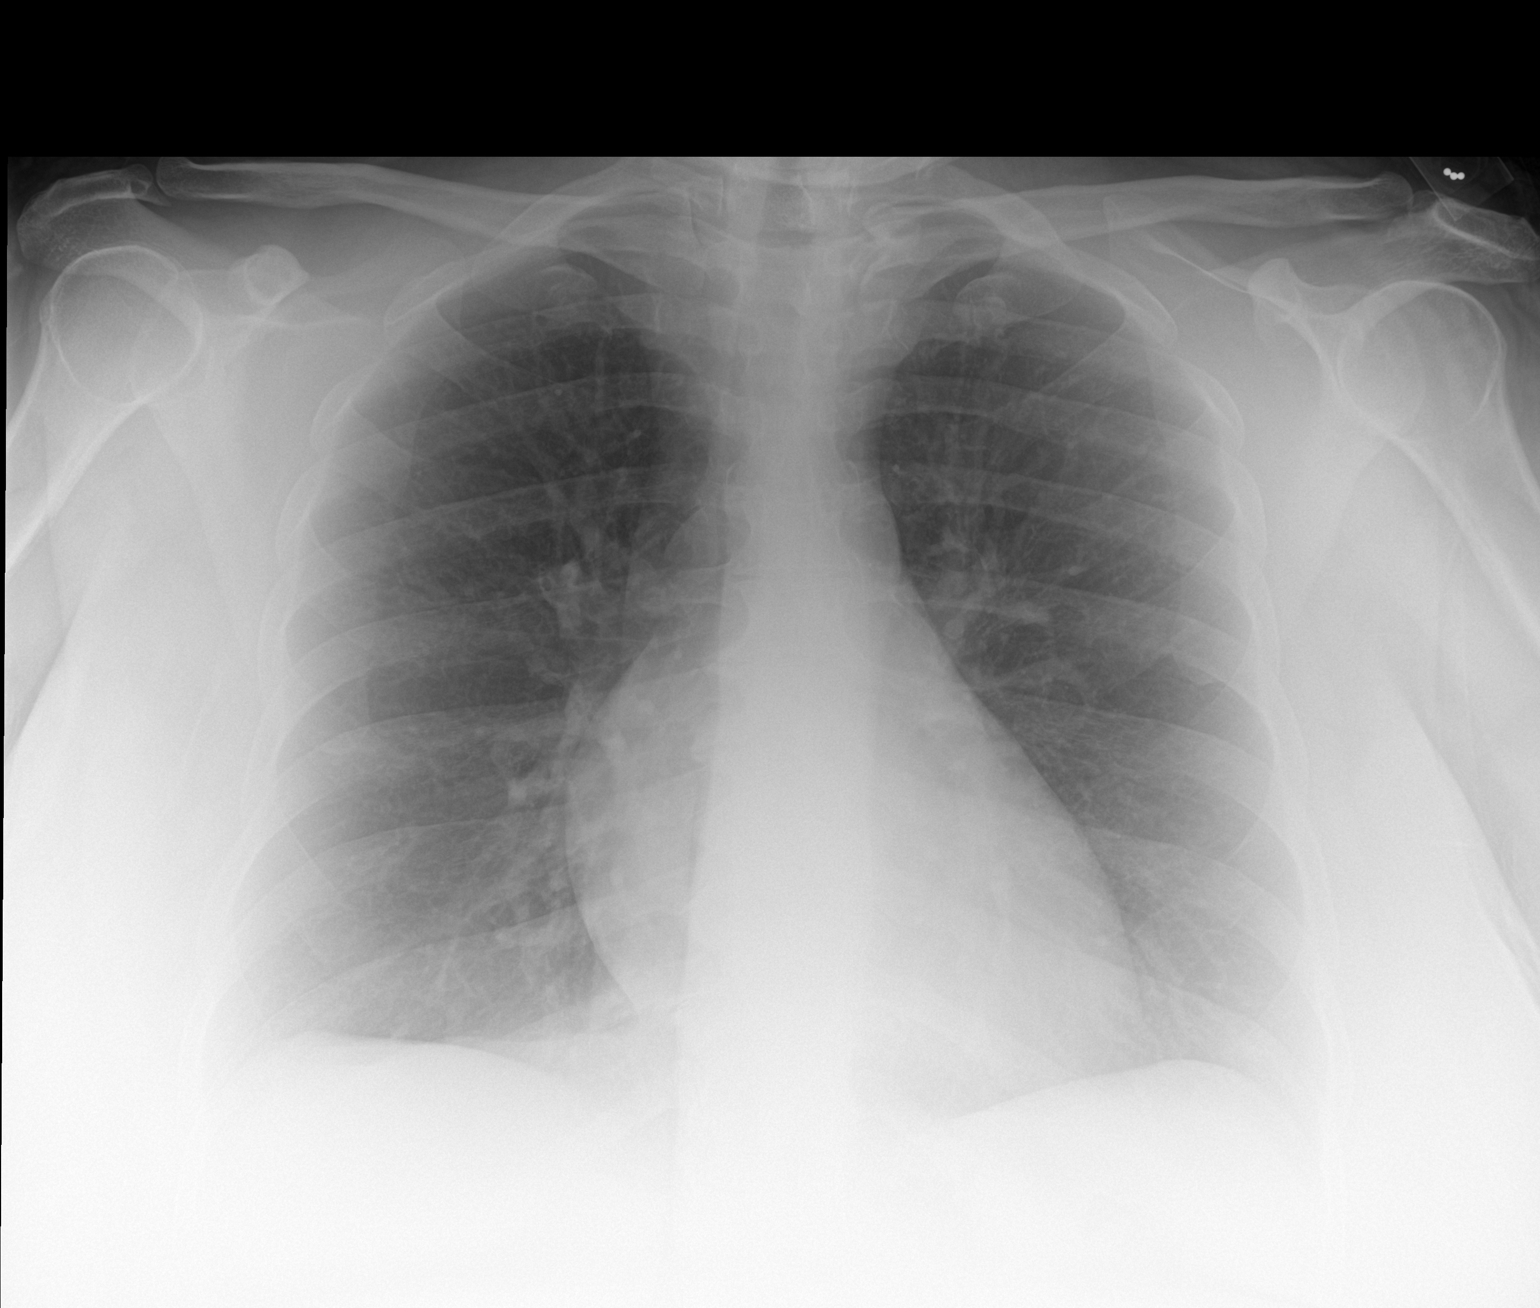

[1 of 1 positions shown; findings below may reference images not displayed]

FINDINGS: Normal heart size, mediastinal contours, and pulmonary vascularity.

Lungs clear.

No acute infiltrate, pleural effusion, or pneumothorax.

Osseous structures unremarkable.
IMPRESSION: No acute abnormalities.

## 2021-12-18 ENCOUNTER — Telehealth: Payer: Self-pay

## 2021-12-19 ENCOUNTER — Other Ambulatory Visit: Payer: Self-pay | Admitting: Nurse Practitioner

## 2021-12-19 DIAGNOSIS — N840 Polyp of corpus uteri: Secondary | ICD-10-CM | POA: Insufficient documentation

## 2021-12-20 ENCOUNTER — Ambulatory Visit
Admission: RE | Admit: 2021-12-20 | Discharge: 2021-12-20 | Disposition: A | Discharge status: Home / Self Care | Payer: Medicaid Other | Source: Ambulatory Visit | Attending: Nurse Practitioner | Admitting: Nurse Practitioner

## 2021-12-20 ENCOUNTER — Ambulatory Visit: Payer: Medicaid Other

## 2021-12-20 ENCOUNTER — Other Ambulatory Visit: Payer: Medicaid Other

## 2021-12-20 DIAGNOSIS — Z1231 Encounter for screening mammogram for malignant neoplasm of breast: Secondary | ICD-10-CM | POA: Diagnosis not present

## 2021-12-20 DIAGNOSIS — N840 Polyp of corpus uteri: Secondary | ICD-10-CM

## 2021-12-20 DIAGNOSIS — N939 Abnormal uterine and vaginal bleeding, unspecified: Secondary | ICD-10-CM | POA: Diagnosis not present

## 2021-12-20 NOTE — Telephone Encounter (Signed)
No additional notes needed  

## 2022-01-07 ENCOUNTER — Other Ambulatory Visit: Payer: Self-pay | Admitting: Nurse Practitioner

## 2022-01-07 MED ORDER — FERROUS GLUCONATE 324 (38 FE) MG PO TABS: 324.0000 mg | ORAL_TABLET | Freq: Every day | ORAL | 0 refills | Status: DC

## 2022-01-09 ENCOUNTER — Other Ambulatory Visit: Payer: Self-pay

## 2022-01-09 ENCOUNTER — Ambulatory Visit (INDEPENDENT_AMBULATORY_CARE_PROVIDER_SITE_OTHER): Payer: Medicaid Other | Admitting: Nurse Practitioner

## 2022-01-09 VITALS — BP 127/68 | HR 79 | Temp 98.0°F | Ht 62.0 in | Wt 282.1 lb

## 2022-01-09 DIAGNOSIS — J4520 Mild intermittent asthma, uncomplicated: Secondary | ICD-10-CM | POA: Diagnosis not present

## 2022-01-09 DIAGNOSIS — T148XXA Other injury of unspecified body region, initial encounter: Secondary | ICD-10-CM

## 2022-01-09 DIAGNOSIS — F419 Anxiety disorder, unspecified: Secondary | ICD-10-CM

## 2022-01-09 DIAGNOSIS — N840 Polyp of corpus uteri: Secondary | ICD-10-CM

## 2022-01-09 DIAGNOSIS — F32A Depression, unspecified: Secondary | ICD-10-CM

## 2022-01-09 MED ORDER — HYDROXYZINE HCL 10 MG PO TABS: 10.0000 mg | ORAL_TABLET | Freq: Three times a day (TID) | ORAL | 0 refills | Status: DC | PRN

## 2022-01-09 NOTE — Patient Instructions (Signed)
Endometrial Biopsy ?An endometrial biopsy is a procedure to remove tissue samples from the endometrium, which is the lining of the uterus. The tissue that is removed can then be checked under a microscope for disease. ?This procedure is used to diagnose conditions such as endometrial cancer, endometrial tuberculosis, polyps, or other inflammatory conditions. This procedure may also be used to investigate uterine bleeding to determine where you are in your menstrual cycle or how your hormone levels are affecting the lining of the uterus. ?Tell a health care provider about: ?Any allergies you have. ?All medicines you are taking, including vitamins, herbs, eye drops, creams, and over-the-counter medicines. ?Any problems you or family members have had with anesthetic medicines. ?Any blood disorders you have. ?Any surgeries you have had. ?Any medical conditions you have. ?Whether you are pregnant or may be pregnant. ?What are the risks? ?Generally, this is a safe procedure. However, problems may occur, including: ?Bleeding. ?Pelvic infection. ?Puncture of the wall of the uterus with the biopsy device (rare). ?Allergic reactions to medicines. ?What happens before the procedure? ?Keep a record of your menstrual cycles as told by your health care provider. You may need to schedule your procedure for a specific time in your cycle. ?You may want to bring a sanitary pad to wear after the procedure. ?Plan to have someone take you home from the hospital or clinic. ?Ask your health care provider about: ?Changing or stopping your regular medicines. This is especially important if you are taking diabetes medicines, arthritis medicines, or blood thinners. ?Taking medicines such as aspirin and ibuprofen. These medicines can thin your blood. Do not take these medicines unless your health care provider tells you to take them. ?Taking over-the-counter medicines, vitamins, herbs, and supplements. ?What happens during the procedure? ?You  will lie on an exam table with your feet and legs supported as in a pelvic exam. ?Your health care provider will insert an instrument (speculum) into your vagina to see your cervix. ?Your cervix will be cleansed with an antiseptic solution. ?A medicine (local anesthetic) will be used to numb the cervix. ?A forceps instrument (tenaculum) will be used to hold your cervix steady for the biopsy. ?A thin, rod-like instrument (uterine sound) will be inserted through your cervix to determine the length of your uterus and the location where the biopsy sample will be removed. ?A thin, flexible tube (catheter) will be inserted through your cervix and into the uterus. The catheter will be used to collect the biopsy sample from your endometrial tissue. ?The catheter and speculum will then be removed, and the tissue sample will be sent to a lab for examination. ?The procedure may vary among health care providers and hospitals. ?What can I expect after procedure? ?You will rest in a recovery area until you are ready to go home. ?You may have mild cramping and a small amount of vaginal bleeding. This is normal. ?You may have a small amount of vaginal bleeding for a few days. This is normal. ?It is up to you to get the results of your procedure. Ask your health care provider, or the department that is doing the procedure, when your results will be ready. ?Follow these instructions at home: ?Take over-the-counter and prescription medicines only as told by your health care provider. ?Do not douche, use tampons, or have sexual intercourse until your health care provider approves. ?Return to your normal activities as told by your health care provider. Ask your health care provider what activities are safe for you. ?Follow instructions   from your health care provider about any activity restrictions, such as restrictions on strenuous exercise or heavy lifting. ?Keep all follow-up visits. This is important. ?Contact a health care  provider: ?You have heavy bleeding, or bleed for longer than 2 days after the procedure. ?You have bad smelling discharge from your vagina. ?You have a fever or chills. ?You have a burning sensation when urinating or you have difficulty urinating. ?You have severe pain in your lower abdomen. ?Get help right away if you: ?You have severe cramps in your stomach or back. ?You pass large blood clots. ?Your bleeding increases. ?You become weak or light-headed, or you faint or lose consciousness. ?Summary ?An endometrial biopsy is a procedure to remove tissue samples is taken from the endometrium, which is the lining of the uterus. ?The tissue sample that is removed will be checked under a microscope for disease. ?This procedure is used to diagnose conditions such as endometrial cancer, endometrial tuberculosis, polyps, or other inflammatory conditions. ?After the procedure, it is common to have mild cramping and a small amount of vaginal bleeding for a few days. ?Do not douche, use tampons, or have sexual intercourse until your health care provider approves. Ask your health care provider which activities are safe for you. ?This information is not intended to replace advice given to you by your health care provider. Make sure you discuss any questions you have with your health care provider. ?Document Revised: 07/18/2021 Document Reviewed: 05/29/2020 ?Elsevier Patient Education ? 2022 Elsevier Inc. ? ?

## 2022-01-09 NOTE — Progress Notes (Signed)
[address], [place]  [address] Phone:  [phone]   Fax:  [phone]   Established Patient Office Visit  Subjective:  Patient ID: Melissa Guerra, female    DOB: 02-Apr-1981  Age: 41 y.o. MRN: [id number]  CC:  Chief Complaint  Patient presents with   Follow-up    Pt is here for follow up visit.    HPI Melissa Guerra presents for follow up. She  has a past medical history of Abnormal Pap smear, Acid reflux, Asthma, and Obesity.   She has completed her Sonohysterograpm which confirm endometrial polyps. She reports increased pain today due to her being on her menstrual cycle. She is having large clots. She endorses worsening of her cycles. She is going to remain out of work today due to her pain.   Anxiety Patient is here for evaluation of anxiety.  She has the following anxiety symptoms: dizziness, feelings of losing control, shortness of breath, sweating, hot . Onset of symptoms was approximately several years ago; 16 years since the birth of her son. Symptoms have been gradually worsening since that time. She denies current suicidal and homicidal ideation. Family history significant for  unknown .Possible organic causes contributing are: none. She has childhood trama that needs to be dealt with. Risk factors: previous episode of depression Previous treatment includes none. She is trying to stay positive. She is trying to get into counseling.   She is going to [facility] November. She is going to get married in October. She is anxious about the flight.  Past Medical History:  Diagnosis Date   Abnormal Pap smear    Acid reflux    Asthma    Obesity     Past Surgical History:  Procedure Laterality Date   FOOT SURGERY     TONSILLECTOMY     TONSILLECTOMY AND ADENOIDECTOMY     TONSILLECTOMY AND ADENOIDECTOMY     WISDOM TOOTH EXTRACTION      Family History  Problem Relation Age of Onset   Cancer Mother    HIV Mother    Diabetes  Maternal Grandmother    Diabetes Paternal Grandmother    Heart failure Father     Social History   Socioeconomic History   Marital status: Single    Spouse name: Not on file   Number of children: Not on file   Years of education: Not on file   Highest education level: Not on file  Occupational History   Not on file  Tobacco Use   Smoking status: Former    Types: Cigarettes   Smokeless tobacco: Never  Vaping Use   Vaping Use: Never used  Substance and Sexual Activity   Alcohol use: No   Drug use: No   Sexual activity: Yes    Birth control/protection: Other-see comments    Comment: female partner  Other Topics Concern   Not on file  Social History Narrative   Not on file   Social Determinants of Health   Financial Resource Strain: Not on file  Food Insecurity: Not on file  Transportation Needs: Not on file  Physical Activity: Not on file  Stress: Not on file  Social Connections: Not on file  Intimate Partner Violence: Not on file    Outpatient Medications Prior to Visit  Medication Sig Dispense Refill   albuterol (PROVENTIL) (2.5 MG/3ML) 0.083% nebulizer solution Take 3 mLs (2.5 mg total) by nebulization every 6 (six) hours as needed for  wheezing or shortness of breath. 75 mL 12   albuterol (VENTOLIN HFA) 108 (90 Base) MCG/ACT inhaler Inhale 2 puffs into the lungs every 6 (six) hours as needed for wheezing or shortness of breath. 8 g 11   ferrous gluconate (FERGON) 324 MG tablet Take 1 tablet (324 mg total) by mouth daily with breakfast. 90 tablet 0   fluticasone (FLONASE) 50 MCG/ACT nasal spray Place 2 sprays into both nostrils daily. 1 g 5   ibuprofen (ADVIL,MOTRIN) 200 MG tablet Take 400 mg by mouth every 6 (six) hours as needed for headache or mild pain.     levocetirizine (XYZAL) 5 MG tablet Take 1 tablet (5 mg total) by mouth every evening. 90 tablet 3   montelukast (SINGULAIR) 10 MG tablet Take 1 tablet (10 mg total) by mouth at bedtime. 90 tablet 3    triamcinolone cream (KENALOG) 0.1 % Apply 1 application topically 2 (two) times daily. Apply for 2 weeks. May use on face 30 g 0   benzonatate (TESSALON) 100 MG capsule Take 1 capsule (100 mg total) by mouth every 8 (eight) hours. (Patient not taking: Reported on 10/08/2021) 21 capsule 0   No facility-administered medications prior to visit.    Allergies  Allergen Reactions   Codeine Anaphylaxis and Other (See Comments)    Patient was hospitalized    Peanut Allergen Powder-Dnfp    Peanut-Containing Drug Products Hives and Itching   Shrimp [Shellfish Allergy]     ROS Review of Systems    Objective:    Physical Exam Constitutional:      Appearance: She is obese.  HENT:     Head: Normocephalic and atraumatic.     Right Ear: Tympanic membrane normal.     Left Ear: Tympanic membrane normal.  Cardiovascular:     Rate and Rhythm: Normal rate and regular rhythm.     Pulses: Normal pulses.     Heart sounds: Normal heart sounds.  Pulmonary:     Effort: Pulmonary effort is normal.     Breath sounds: Normal breath sounds.  Musculoskeletal:     Cervical back: Normal range of motion.     Right lower leg: No edema.     Left lower leg: No edema.     Comments: Right calf bruise papulate knot related to a fall on the stairs at work.   Skin:    General: Skin is warm.     Capillary Refill: Capillary refill takes less than 2 seconds.  Neurological:     General: No focal deficit present.     Mental Status: She is alert and oriented to person, place, and time.    BP 127/68    Pulse 79    Temp 98 F (36.7 C)    Ht '5\' 2"'  (1.575 m)    Wt 282 lb 0.8 oz (127.9 kg)    LMP 01/07/2022 (Exact Date)    SpO2 100%    BMI 51.59 kg/m  Wt Readings from Last 3 Encounters:  01/09/22 282 lb 0.8 oz (127.9 kg)  10/08/21 281 lb 2 oz (127.5 kg)  08/23/21 280 lb 12.8 oz (127.4 kg)     There are no preventive care reminders to display for this patient.   There are no preventive care reminders to  display for this patient.  Lab Results  Component Value Date   TSH 1.510 10/20/2020   Lab Results  Component Value Date   WBC 5.7 10/20/2020   HGB 9.3 (L) 10/20/2020   HCT  29.8 (L) 10/20/2020   MCV 76 (L) 10/20/2020   PLT 337 10/20/2020   Lab Results  Component Value Date   NA 140 03/19/2021   K 3.9 03/19/2021   CO2 27 09/23/2020   GLUCOSE 79 03/19/2021   BUN 9 03/19/2021   CREATININE 0.60 03/19/2021   BILITOT 0.4 03/19/2021   ALKPHOS 83 03/19/2021   AST 15 03/19/2021   ALT 17 09/23/2020   PROT 6.4 03/19/2021   ALBUMIN 4.0 03/19/2021   CALCIUM 8.5 (L) 03/19/2021   ANIONGAP 4 (L) 09/23/2020   EGFR 116 03/19/2021   Lab Results  Component Value Date   CHOL 122 03/19/2021   Lab Results  Component Value Date   HDL 60 03/19/2021   Lab Results  Component Value Date   LDLCALC 48 03/19/2021   Lab Results  Component Value Date   TRIG 70 03/19/2021   Lab Results  Component Value Date   CHOLHDL 2.0 03/19/2021   Lab Results  Component Value Date   HGBA1C 5.3 10/20/2020      Assessment & Plan:   Problem List Items Addressed This Visit       Respiratory   Mild intermittent asthma without complication Persistent  Continue with current regimen.  No changes warranted. Good patient compliance.      Genitourinary   Uterine polyp - Primary Worsening Referral for further evaluation   Relevant Orders   Ambulatory referral to Obstetrics / Gynecology   Other Visit Diagnoses     Anxiety     Worsening  Trial hydroxyzine 10 mg TID prn Referral    Relevant Medications   hydrOXYzine (ATARAX) 10 MG tablet   Other Relevant Orders   Ambulatory referral to Psychiatry   Depression, unspecified depression type     Worsening    Relevant Medications   hydrOXYzine (ATARAX) 10 MG tablet   Other Relevant Orders   Ambulatory referral to Psychiatry   Bruising      Improving to left calf       Meds ordered this encounter  Medications   hydrOXYzine (ATARAX) 10  MG tablet    Sig: Take 1 tablet (10 mg total) by mouth 3 (three) times daily as needed.    Dispense:  30 tablet    Refill:  0    Order Specific Question:   Supervising Provider    Answer:   Tresa Garter [[id number]]    Follow-up: Return in about 4 weeks (around 02/06/2022) for virtual visit.    Vevelyn Francois, NP

## 2022-01-10 ENCOUNTER — Encounter: Payer: Self-pay | Admitting: Nurse Practitioner

## 2022-01-14 ENCOUNTER — Telehealth: Payer: Self-pay | Admitting: Clinical

## 2022-01-14 NOTE — Telephone Encounter (Addendum)
Integrated Behavioral Health Case Management Referral Note  01/14/2022 Name: Lucynda TAMICKA SHIMON MRN: [id number] DOB: [birth year] Kimberlie SYMPHANIE CEDERBERG is a 41 y.o. year old female who sees Barbette Merino, NP for primary care. LCSW was consulted to assess patient's needs and assist the patient with Mental Health Counseling and Resources.  Interpreter: No.   Interpreter Name & Language: none  Assessment: Patient experiencing mental health concerns. She would like to see a Veterinary surgeon.  Intervention: CSW called patient today and discussed referral for counseling. She prefers to see a counselor outside of the [facility]). Called [facility]) together with patient and left voicemail requesting call back to schedule. Advised patient of [facility] walk in hours as well.   Review of patient status, including review of consultants reports, relevant laboratory and other test results, and collaboration with appropriate care team members and the patient's provider was performed as part of comprehensive patient evaluation and provision of services.    Abigail Butts, LCSW [facility] [phone]  =

## 2022-01-22 ENCOUNTER — Telehealth: Payer: Self-pay | Admitting: Clinical

## 2022-01-22 NOTE — Telephone Encounter (Signed)
Integrated Behavioral Health ?General Follow Up Note ? ?01/22/2022 ?Name: Melissa Guerra MRN: [id number] DOB: 1981/11/10 ?Melissa Guerra is a 41 y.o. year old female who sees Vevelyn Francois, NP for primary care. LCSW was consulted to assess Melissa Guerra's needs and assist the Melissa Guerra with Mental Health Counseling and Resources. ? ?Interpreter: No.   Interpreter Name & Language: none ? ?Assessment: Melissa Guerra experiencing mental health concerns. She would like to see a Social worker. ? ?Ongoing Intervention: Today CSW called Melissa Guerra to follow up on referral to [facility]). Melissa Guerra had not yet received follow up call from them to schedule. She did not want to use walk-in hours. Called [facility] and they are scheduling out to June at this time. ? ?Discussed referral to [facility], as they accept Melissa Guerra's insurance. Melissa Guerra agreeable to this referral. CSW to coordinate with Melissa Guerra [facility]) referral coordinator to have this referral sent and will assist Melissa Guerra in scheduling. ? ?Review of Melissa Guerra status, including review of consultants reports, relevant laboratory and other test results, and collaboration with appropriate care team members and the Melissa Guerra's provider was performed as part of comprehensive Melissa Guerra evaluation and provision of services.   ? ?Estanislado Emms, LCSW ?Melissa Guerra [place] ?[place] ?[phone] ?  ? ?

## 2022-01-23 ENCOUNTER — Other Ambulatory Visit: Payer: Self-pay | Admitting: Nurse Practitioner

## 2022-01-25 ENCOUNTER — Other Ambulatory Visit: Payer: Self-pay | Admitting: Nurse Practitioner

## 2022-01-25 MED ORDER — AMOXICILLIN-POT CLAVULANATE 875-125 MG PO TABS: 1.0000 | ORAL_TABLET | Freq: Two times a day (BID) | ORAL | 0 refills | Status: AC

## 2022-02-01 ENCOUNTER — Telehealth: Payer: Self-pay | Admitting: Clinical

## 2022-02-01 NOTE — Telephone Encounter (Signed)
Integrated Behavioral Health ?General Follow Up Note ? ?02/01/2022 ?Name: Melissa Guerra MRN: [id number] DOB: 1981-06-20 ?Melissa Guerra is a 41 y.o. year old female who sees Barbette Merino, NP for primary care. LCSW was consulted to assess patient's needs and assist the patient with Mental Health Counseling and Resources. ? ?Interpreter: No.   Interpreter Name & Language: none ? ?Assessment: Patient experiencing mental health concerns. She would like to see a Veterinary surgeon. ? ?Ongoing Intervention: Called patient to follow up regarding referrals on 3/13, 3/14, and today 02/01/22. No answer at each of these calls; CSW left voice mails. [facility]) can schedule patient, though they are scheduling out until May. Will need to advise patient of other mental health referral options including [facility] and [facility]. Have not been able to reach patient this week; CSW will be available from clinic for assistance on follow up for these referrals.  ? ?Review of patient status, including review of consultants reports, relevant laboratory and other test results, and collaboration with appropriate care team members and the patient's provider was performed as part of comprehensive patient evaluation and provision of services.   ? ?Abigail Butts, LCSW ?[facility] ?[facility] ?[phone] ?  ? ?

## 2022-02-06 ENCOUNTER — Telehealth (INDEPENDENT_AMBULATORY_CARE_PROVIDER_SITE_OTHER): Payer: Medicaid Other | Admitting: Nurse Practitioner

## 2022-02-06 ENCOUNTER — Encounter: Payer: Self-pay | Admitting: Nurse Practitioner

## 2022-02-06 DIAGNOSIS — T7840XA Allergy, unspecified, initial encounter: Secondary | ICD-10-CM

## 2022-02-06 DIAGNOSIS — G894 Chronic pain syndrome: Secondary | ICD-10-CM

## 2022-02-06 DIAGNOSIS — F419 Anxiety disorder, unspecified: Secondary | ICD-10-CM

## 2022-02-06 DIAGNOSIS — J4520 Mild intermittent asthma, uncomplicated: Secondary | ICD-10-CM

## 2022-02-06 DIAGNOSIS — N840 Polyp of corpus uteri: Secondary | ICD-10-CM | POA: Diagnosis not present

## 2022-02-06 MED ORDER — EPINEPHRINE 0.3 MG/0.3ML IJ SOAJ: 0.3000 mg | INTRAMUSCULAR | 0 refills | Status: AC | PRN

## 2022-02-06 MED ORDER — HYDROXYZINE PAMOATE 25 MG PO CAPS
25.0000 mg | ORAL_CAPSULE | Freq: Three times a day (TID) | ORAL | 2 refills | Status: AC | PRN
Start: 2022-02-06 — End: ?

## 2022-02-06 NOTE — Progress Notes (Signed)
? ?  [place] ?[place], [place]  [address] ?Phone:  [phone]   Fax:  [phone] ? ? ?Virtual Visit via Video Note ? ?I connected with Kienna J Chavero on 02/13/22 at  3:00 PM EDT by video and verified that I am speaking with the correct person using two identifiers. ?  ?I discussed the limitations, risks, security and privacy concerns of performing an evaluation and management service by video and the availability of in person appointments. I also discussed with the patient that there may be a patient responsible charge related to this service. The patient expressed understanding and agreed to proceed. ? ?Patient home ?Provider Office ? ?History of Present Illness:  ? ?She has a past medical history of Abnormal Pap smear, Acid reflux, Asthma, and Obesity.  ? ?HPI  ?She fis following up for her anxiety. She feels like the hydroxyzine 10 mg is effective but she is not sure how effective. She uses it regularly. She has not established care with psychiatry at this time.  ?  ?She has an apt with ENT.  ? ?She had an allergic reaction while at work. She was not able to determine the cause. She was having some swelling and hives.  ? ?ROS ? ?  ?Observations/Objective: ?Virtual visit no exam  ? ?Assessment and Plan: ?1. Anxiety ?Increased- hydrOXYzine (VISTARIL) 25 MG capsule; Take 1 capsule (25 mg total) by mouth every 8 (eight) hours as needed.  Dispense: 60 capsule; Refill: 2 ? ?2. Uterine polyp ?Establish care with GYN as scheduled ? ?3. Mild intermittent asthma without complication ?- Ambulatory referral to Allergy ? ?4. Allergic reaction, initial encounter ?Unknown irritant possibility of worsening symptoms.  ?- EPINEPHrine (EPIPEN 2-PAK) 0.3 mg/0.3 mL IJ SOAJ injection; Inject 0.3 mg into the muscle as needed for anaphylaxis.  Dispense: 1 each; Refill: 0 ?- Ambulatory referral to Allergy ? ?5. Chronic pain syndrome ?- Ambulatory referral to Physical Therapy ? ? ? ?Follow Up  Instructions: ?4-6 weeks ? ?  ?I discussed the assessment and treatment plan with the patient. The patient was provided an opportunity to ask questions and all were answered. The patient agreed with the plan and demonstrated an understanding of the instructions. ?  ?The patient was advised to call back or seek an in-person evaluation if the symptoms worsen or if the condition fails to improve as anticipated. ? ?I provided 18 minutes of video- visit time during this encounter. ? ? ?Vevelyn Francois, NP ?   ?

## 2022-02-06 NOTE — Patient Instructions (Addendum)
? ?[place] ?[address], [place]  [address] ? ?Phone:  [phone]   Fax:  [phone] ? ?OB/GYN appointment ? April 18, 2022 @ 9:35 ?[facility] ? ?Managing Anxiety, Adult ?After being diagnosed with anxiety, you may be relieved to know why you have felt or behaved a certain way. You may also feel overwhelmed about the treatment ahead and what it will mean for your life. With care and support, you can manage this condition. ?How to manage lifestyle changes ?Managing stress and anxiety ?Stress is your body's reaction to life changes and events, both good and bad. Most stress will last just a few hours, but stress can be ongoing and can lead to more than just stress. Although stress can play a major role in anxiety, it is not the same as anxiety. Stress is usually caused by something external, such as a deadline, test, or competition. Stress normally passes after the triggering event has ended.  ?Anxiety is caused by something internal, such as imagining a terrible outcome or worrying that something will go wrong that will devastate you. Anxiety often does not go away even after the triggering event is over, and it can become long-term (chronic) worry. It is important to understand the differences between stress and anxiety and to manage your stress effectively so that it does not lead to an anxious response. ?Talk with your health care provider or a counselor to learn more about reducing anxiety and stress. He or she may suggest tension reduction techniques, such as: ?Music therapy. Spend time creating or listening to music that you enjoy and that inspires you. ?Mindfulness-based meditation. Practice being aware of your normal breaths while not trying to control your breathing. It can be done while sitting or walking. ?Centering prayer. This involves focusing on a word, phrase, or sacred image that means something to you and  brings you peace. ?Deep breathing. To do this, expand your stomach and inhale slowly through your nose. Hold your breath for 3-5 seconds. Then exhale slowly, letting your stomach muscles relax. ?Self-talk. Learn to notice and identify thought patterns that lead to anxiety reactions and change those patterns to thoughts that feel peaceful. ?Muscle relaxation. Taking time to tense muscles and then relax them. ?Choose a tension reduction technique that fits your lifestyle and personality. These techniques take time and practice. Set aside 5-15 minutes a day to do them. Therapists can offer counseling and training in these techniques. The training to help with anxiety may be covered by some insurance plans. ?Other things you can do to manage stress and anxiety include: ?Keeping a stress diary. This can help you learn what triggers your reaction and then learn ways to manage your response. ?Thinking about how you react to certain situations. You may not be able to control everything, but you can control your response. ?Making time for activities that help you relax and not feeling guilty about spending your time in this way. ?Doing visual imagery. This involves imagining or creating mental pictures to help you relax. ?Practicing yoga. Through yoga poses, you can lower tension and promote relaxation. ? ?Medicines ?Medicines can help ease symptoms. Medicines for anxiety include: ?Antidepressant medicines. These are usually prescribed for long-term daily control. ?Anti-anxiety medicines. These may be added in severe cases, especially when panic attacks occur. ?Medicines will be prescribed by a health care provider. When used together, medicines, psychotherapy, and tension reduction techniques may be the  most effective treatment. ?Relationships ?Relationships can play a big part in helping you recover. Try to spend more time connecting with trusted friends and family members. ?Consider going to couples counseling if you have  a partner, taking family education classes, or going to family therapy. ?Therapy can help you and others better understand your condition. ?How to recognize changes in your anxiety ?Everyone responds differently to treatment for anxiety. Recovery from anxiety happens when symptoms decrease and stop interfering with your daily activities at home or work. This may mean that you will start to: ?Have better concentration and focus. Worry will interfere less in your daily thinking. ?Sleep better. ?Be less irritable. ?Have more energy. ?Have improved memory. ?It is also important to recognize when your condition is getting worse. Contact your health care provider if your symptoms interfere with home or work and you feel like your condition is not improving. ?Follow these instructions at home: ?Activity ?Exercise. Adults should do the following: ?Exercise for at least 150 minutes each week. The exercise should increase your heart rate and make you sweat (moderate-intensity exercise). ?Strengthening exercises at least twice a week. ?Get the right amount and quality of sleep. Most adults need 7-9 hours of sleep each night. ?Lifestyle ? ?Eat a healthy diet that includes plenty of vegetables, fruits, whole grains, low-fat dairy products, and lean protein. ?Do not eat a lot of foods that are high in fats, added sugars, or salt (sodium). ?Make choices that simplify your life. ?Do not use any products that contain nicotine or tobacco. These products include cigarettes, chewing tobacco, and vaping devices, such as e-cigarettes. If you need help quitting, ask your health care provider. ?Avoid caffeine, alcohol, and certain over-the-counter cold medicines. These may make you feel worse. Ask your pharmacist which medicines to avoid. ?General instructions ?Take over-the-counter and prescription medicines only as told by your health care provider. ?Keep all follow-up visits. This is important. ?Where to find support ?You can get help  and support from these sources: ?Self-help groups. ?Online and [employer]. ?A trusted spiritual leader. ?Couples counseling. ?Family education classes. ?Family therapy. ?Where to find more information ?You may find that joining a support group helps you deal with your anxiety. The following sources can help you locate counselors or support groups near you: ?[place]: www.mentalhealthamerica.net ?Anxiety and Depression Association of America (ADAA): https://www.clark.net/ ?National Alliance on Mental Illness (NAMI): www.nami.org ?Contact a health care provider if: ?You have a hard time staying focused or finishing daily tasks. ?You spend many hours a day feeling worried about everyday life. ?You become exhausted by worry. ?You start to have headaches or frequently feel tense. ?You develop chronic nausea or diarrhea. ?Get help right away if: ?You have a racing heart and shortness of breath. ?You have thoughts of hurting yourself or others. ?If you ever feel like you may hurt yourself or others, or have thoughts about taking your own life, get help right away. Go to your nearest emergency department or: ?Call your local emergency services (911 in the [place] ?Call a suicide crisis helpline, such as the [place] at (631) 416-5032 or 988 in the [place]. This is open 24 hours a day in the [place] ?Text the Crisis Text Line at 367-120-9622 (in the [place].). ?Summary ?Taking steps to learn and use tension reduction techniques can help calm you and help prevent triggering an anxiety reaction. ?When used together, medicines, psychotherapy, and tension reduction techniques may be the most effective treatment. ?Family, friends, and partners can  play a big part in supporting you. ?This information is not intended to replace advice given to you by your health care provider. Make sure you discuss any questions you have with your health care provider. ?Document Revised: 05/30/2021 Document Reviewed:  02/25/2021 ?Elsevier Patient Education ? [place]. ? ?

## 2022-02-17 ENCOUNTER — Ambulatory Visit
Admission: RE | Admit: 2022-02-17 | Discharge: 2022-02-17 | Disposition: A | Discharge status: Home / Self Care | Payer: Medicaid Other | Source: Ambulatory Visit | Attending: Physician Assistant | Admitting: Physician Assistant

## 2022-02-17 VITALS — BP 105/72 | HR 74 | Temp 98.0°F | Resp 16

## 2022-02-17 DIAGNOSIS — M542 Cervicalgia: Secondary | ICD-10-CM | POA: Diagnosis not present

## 2022-02-17 DIAGNOSIS — J069 Acute upper respiratory infection, unspecified: Secondary | ICD-10-CM | POA: Diagnosis not present

## 2022-02-17 MED ORDER — CYCLOBENZAPRINE HCL 10 MG PO TABS: 10.0000 mg | ORAL_TABLET | Freq: Two times a day (BID) | ORAL | 0 refills | Status: DC | PRN

## 2022-02-17 MED ORDER — PREDNISONE 20 MG PO TABS: 40.0000 mg | ORAL_TABLET | Freq: Every day | ORAL | 0 refills | Status: AC

## 2022-02-17 NOTE — ED Triage Notes (Signed)
2 days, headache, cough, nasal congestion, abdominal pain, LMP 02/10/2022. "I feel like something is attacking my body." Also c/o left shoulder/neck pain worse with movement. "I am also grieving, my best friend's son passed Monday." ?

## 2022-02-17 NOTE — ED Provider Notes (Signed)
?[name] ? ? ? ?CSN: [id number] ?Arrival date & time: 02/17/22  1110 ? ? ?  ? ?History   ?Chief Complaint ?Chief Complaint  ?Patient presents with  ? Headache  ?  Shoulder & Neck & Back Pain Can Hardly Turn Neck Stomach Pains Left Ear Feels Clogged & Nosal Congestion Fatigue - Entered by patient  ? ? ?HPI ?Melissa Guerra is a 41 y.o. female.  ? ?Patient here today for evaluation of headache, cough, congestion that started 2 days ago. She has not had fever. She reports some abdominal pain but no vomiting. She has not tried medication for symptoms. She also notes some left shoulder and neck pain that is worse with movement. She is not sure why this pain is present and denies any known injury. Neck pain seems to radiate into shoulder.  ? ?She reports she is currently grieving as a 91 year old child that she was close to passed away earlier this week and just had his funeral. She denies any thoughts of self harm. She is following with her PCP regarding anxiety and depression.  ? ?The history is provided by the patient.  ?Headache ?Associated symptoms: no abdominal pain, no fever, no nausea and no vomiting   ? ?Past Medical History:  ?Diagnosis Date  ? Abnormal Pap smear   ? Acid reflux   ? Asthma   ? Obesity   ? ? ?Patient Active Problem List  ? Diagnosis Date Noted  ? Uterine polyp 12/19/2021  ? Mild intermittent asthma without complication 09/13/2021  ? COVID-19 05/15/2021  ? Class 3 severe obesity due to excess calories without serious comorbidity with body mass index (BMI) of 45.0 to 49.9 in adult [facility]) 03/19/2021  ? Exposure to COVID-19 virus 09/28/2020  ? Acute non-recurrent frontal sinusitis 09/28/2020  ? CIN II (cervical intraepithelial neoplasia II) 01/02/2012  ? ? ?Past Surgical History:  ?Procedure Laterality Date  ? FOOT SURGERY    ? TONSILLECTOMY    ? TONSILLECTOMY AND ADENOIDECTOMY    ? TONSILLECTOMY AND ADENOIDECTOMY    ? WISDOM TOOTH EXTRACTION    ? ? ?OB History   ? ? Gravida  ?2  ? Para   ?2  ? Term  ?   ? Preterm  ?   ? AB  ?   ? Living  ?   ?  ? ? SAB  ?   ? IAB  ?   ? Ectopic  ?   ? Multiple  ?   ? Live Births  ?   ?   ?  ?  ? ? ? ?Home Medications   ? ?Prior to Admission medications   ?Medication Sig Start Date End Date Taking? Authorizing Provider  ?cyclobenzaprine (FLEXERIL) 10 MG tablet Take 1 tablet (10 mg total) by mouth 2 (two) times daily as needed for muscle spasms. 02/17/22  Yes Tomi Bamberger, PA-C  ?predniSONE (DELTASONE) 20 MG tablet Take 2 tablets (40 mg total) by mouth daily with breakfast for 5 days. 02/17/22 02/22/22 Yes Tomi Bamberger, PA-C  ?albuterol (PROVENTIL) (2.5 MG/3ML) 0.083% nebulizer solution Take 3 mLs (2.5 mg total) by nebulization every 6 (six) hours as needed for wheezing or shortness of breath. 05/03/21   Barbette Merino, NP  ?albuterol (VENTOLIN HFA) 108 (90 Base) MCG/ACT inhaler Inhale 2 puffs into the lungs every 6 (six) hours as needed for wheezing or shortness of breath. 04/16/21   Barbette Merino, NP  ?EPINEPHrine (EPIPEN 2-PAK) 0.3 mg/0.3  mL IJ SOAJ injection Inject 0.3 mg into the muscle as needed for anaphylaxis. 02/06/22   Barbette MerinoKing, Crystal M, NP  ?ferrous gluconate (FERGON) 324 MG tablet Take 1 tablet (324 mg total) by mouth daily with breakfast. 01/07/22 01/07/23  Barbette MerinoKing, Crystal M, NP  ?fluticasone (FLONASE) 50 MCG/ACT nasal spray Place 2 sprays into both nostrils daily. 10/23/20   Barbette MerinoKing, Crystal M, NP  ?hydrOXYzine (VISTARIL) 25 MG capsule Take 1 capsule (25 mg total) by mouth every 8 (eight) hours as needed. 02/06/22   Barbette MerinoKing, Crystal M, NP  ?ibuprofen (ADVIL,MOTRIN) 200 MG tablet Take 400 mg by mouth every 6 (six) hours as needed for headache or mild pain.    [provider]  ?levocetirizine (XYZAL) 5 MG tablet Take 1 tablet (5 mg total) by mouth every evening. 03/21/21 03/21/22  Barbette MerinoKing, Crystal M, NP  ?montelukast (SINGULAIR) 10 MG tablet Take 1 tablet (10 mg total) by mouth at bedtime. 10/08/21 10/08/22  Barbette MerinoKing, Crystal M, NP  ?triamcinolone cream (KENALOG) 0.1 %  Apply 1 application topically 2 (two) times daily. Apply for 2 weeks. May use on face 06/21/21   Barbette MerinoKing, Crystal M, NP  ? ? ?Family History ?Family History  ?Problem Relation Age of Onset  ? Cancer Mother   ? HIV Mother   ? Diabetes Maternal Grandmother   ? Diabetes Paternal Grandmother   ? Heart failure Father   ? ? ?Social History ?Social History  ? ?Tobacco Use  ? Smoking status: Former  ?  Types: Cigarettes  ? Smokeless tobacco: Never  ?Vaping Use  ? Vaping Use: Never used  ?Substance Use Topics  ? Alcohol use: No  ? Drug use: No  ? ? ? ?Allergies   ?Codeine, Amoxicillin, Peanut allergen powder-dnfp, Peanut-containing drug products, and Shrimp [shellfish allergy] ? ? ?Review of Systems ?Review of Systems  ?Constitutional:  Negative for chills and fever.  ?Eyes:  Negative for discharge and redness.  ?Gastrointestinal:  Negative for abdominal pain, nausea and vomiting.  ?Genitourinary:  Positive for vaginal bleeding and vaginal discharge.  ?Neurological:  Positive for headaches.  ? ? ?Physical Exam ?Triage Vital Signs ?ED Triage Vitals  ?Enc Vitals Group  ?   BP 02/17/22 1137 105/72  ?   Pulse Rate 02/17/22 1137 74  ?   Resp 02/17/22 1137 16  ?   Temp 02/17/22 1137 98 ?F (36.7 ?C)  ?   Temp Source 02/17/22 1137 Oral  ?   SpO2 02/17/22 1137 99 %  ?   Weight --   ?   Height --   ?   Head Circumference --   ?   Peak Flow --   ?   Pain Score 02/17/22 1138 8  ?   Pain Loc --   ?   Pain Edu? --   ?   Excl. in GC? --   ? ?No data found. ? ?Updated Vital Signs ?BP 105/72 (BP Location: Left Arm)   Pulse 74   Temp 98 ?F (36.7 ?C) (Oral)   Resp 16   SpO2 99%  ?   ? ?Physical Exam ?Vitals and nursing note reviewed.  ?Constitutional:   ?   General: She is not in acute distress. ?   Appearance: Normal appearance. She is not ill-appearing.  ?HENT:  ?   Head: Normocephalic and atraumatic.  ?   Nose: Congestion present.  ?   Mouth/Throat:  ?   Mouth: Mucous membranes are moist.  ?   Pharynx: No oropharyngeal exudate  or posterior  oropharyngeal erythema.  ?Eyes:  ?   Conjunctiva/sclera: Conjunctivae normal.  ?Cardiovascular:  ?   Rate and Rhythm: Normal rate and regular rhythm.  ?   Heart sounds: Normal heart sounds. No murmur heard. ?Pulmonary:  ?   Effort: Pulmonary effort is normal. No respiratory distress.  ?   Breath sounds: Normal breath sounds. No wheezing, rhonchi or rales.  ?Musculoskeletal:  ?   Comments: Mild TTP to left trapezius, no TTP to midline C-Spine  ?Neurological:  ?   Mental Status: She is alert.  ?Psychiatric:     ?   Mood and Affect: Mood normal.     ?   Behavior: Behavior normal.     ?   Thought Content: Thought content normal.  ? ? ? ?UC Treatments / Results  ?Labs ?(all labs ordered are listed, but only abnormal results are displayed) ?Labs Reviewed  ?COVID-19, FLU A+B NAA  ? ? ?EKG ? ? ?Radiology ?No results found. ? ?Procedures ?Procedures (including critical care time) ? ?Medications Ordered in UC ?Medications - No data to display ? ?Initial Impression / Assessment and Plan / UC Course  ?I have reviewed the triage vital signs and the nursing notes. ? ?Pertinent labs & imaging results that were available during my care of the patient were reviewed by me and considered in my medical decision making (see chart for details). ? ?  ?Discussed that current ailments may be multifactorial. Strep test negative in office. Will screen for covid and flu. Muscle relaxer and steroid prescribed for suspected muscle strain causing neck/ shoulder pain vs radiculopathy. Contact information given for behavioral health urgent care if needed. Encourage follow up with any further concerns.  ? ?Final Clinical Impressions(s) / UC Diagnoses  ? ?Final diagnoses:  ?Acute upper respiratory infection  ?Neck pain  ? ? ? ?Discharge Instructions   ? ?  ? ?[facility] ? ?[address]  ?[place], [place] ? ?[phone] ? ? ? ? ? ? ?ED Prescriptions   ? ? Medication Sig Dispense Auth. Provider  ? predniSONE  (DELTASONE) 20 MG tablet Take 2 tablets (40 mg total) by mouth daily with breakfast for 5 days. 10 tablet Tomi Bamberger, PA-C  ? cyclobenzaprine (FLEXERIL) 10 MG tablet Take 1 tablet (10 mg total) by mouth 2 (t

## 2022-02-17 NOTE — Discharge Instructions (Signed)
?  [facility] ? ?[address] ?[place], [place] ? ?[phone] ? ? ?

## 2022-02-18 ENCOUNTER — Telehealth: Payer: Self-pay | Admitting: Clinical

## 2022-02-18 NOTE — Telephone Encounter (Signed)
[facility] ?General Follow Up Note ? ?02/18/2022 ?Name: Melissa Guerra MRN: [id number] DOB: April 28, 1981 ?[name] is a 41 y.o. year old female who sees Barbette Merino, NP for primary care. LCSW was consulted to assess patient's needs and assist the patient with Mental Health Counseling and Resources. ? ?Interpreter: No.   Interpreter Name & Language: none ? ?Assessment: Patient experiencing mental health concerns. She would like to see a Veterinary surgeon. ? ?Ongoing Intervention: Called patient to follow up regarding referral for therapy last month. Was not able to reach patient at that time. Was able to connect with patient today. She strongly prefers a female therapist so declined the referral to [facility] as they only have a female therapist available. Discussed walk-ins at [facility]) and [facility] [place]. Patient agreeable to try walk-in at one of these locations and requested the information be sent to her via MyChart. CSW sent information on these two facilities. Advised patient to call with any additional questions or assistance with follow up.  ? ?Review of patient status, including review of consultants reports, relevant laboratory and other test results, and collaboration with appropriate care team members and the patient's provider was performed as part of comprehensive patient evaluation and provision of services.   ? ?Abigail Butts, LCSW ?Patient Care Center ?[facility] ?[phone] ?  ? ?

## 2022-02-19 LAB — COVID-19, FLU A+B NAA
Influenza A, NAA: NOT DETECTED
Influenza B, NAA: NOT DETECTED
SARS-CoV-2, NAA: NOT DETECTED

## 2022-04-08 ENCOUNTER — Ambulatory Visit
Admission: RE | Admit: 2022-04-08 | Discharge: 2022-04-08 | Disposition: A | Discharge status: Home / Self Care | Payer: Medicaid Other | Source: Ambulatory Visit | Attending: Family Medicine | Admitting: Family Medicine

## 2022-04-08 VITALS — BP 118/79 | HR 89 | Temp 98.3°F | Resp 17

## 2022-04-08 DIAGNOSIS — M5412 Radiculopathy, cervical region: Secondary | ICD-10-CM | POA: Diagnosis not present

## 2022-04-08 DIAGNOSIS — J069 Acute upper respiratory infection, unspecified: Secondary | ICD-10-CM

## 2022-04-08 MED ORDER — CYCLOBENZAPRINE HCL 10 MG PO TABS: 10.0000 mg | ORAL_TABLET | Freq: Two times a day (BID) | ORAL | 0 refills | Status: DC | PRN

## 2022-04-08 MED ORDER — PREDNISONE 20 MG PO TABS: 40.0000 mg | ORAL_TABLET | Freq: Every day | ORAL | 0 refills | Status: DC

## 2022-04-08 NOTE — ED Triage Notes (Signed)
Pt presents with right side neck pain that radiates down into right arm & right side of back for past few days.

## 2022-04-09 LAB — COVID-19, FLU A+B NAA
Influenza A, NAA: NOT DETECTED
Influenza B, NAA: NOT DETECTED
SARS-CoV-2, NAA: NOT DETECTED

## 2022-04-12 NOTE — ED Provider Notes (Signed)
[facility]    CSN: [id number] Arrival date & time: 04/08/22  1341      History   Chief Complaint Chief Complaint  Patient presents with   Back Pain    Pain in Shoulder & Neck Running down my back arm & side hurts to turn my neck nasal drainge little cough head hurts - Entered by patient   Neck Pain   Arm Pain    HPI Melissa Guerra is a 41 y.o. female.   Presenting with several day history of progressively worsening right neck pain radiating down right arm. Denies numbness, weakness, decreased ROM, swelling, discoloration, known injury. Trying OTC pain relievers with minimal relief. Also having several days of congestion, cough, fatigue. Taking OTC congestion medications with no relief. Hx of asthma on albuterol prn.    Past Medical History:  Diagnosis Date   Abnormal Pap smear    Acid reflux    Asthma    Obesity     Patient Active Problem List   Diagnosis Date Noted   Uterine polyp 12/19/2021   Mild intermittent asthma without complication 09/13/2021   COVID-19 05/15/2021   Class 3 severe obesity due to excess calories without serious comorbidity with body mass index (BMI) of 45.0 to 49.9 in adult [facility]) 03/19/2021   Exposure to COVID-19 virus 09/28/2020   Acute non-recurrent frontal sinusitis 09/28/2020   CIN II (cervical intraepithelial neoplasia II) 01/02/2012    Past Surgical History:  Procedure Laterality Date   FOOT SURGERY     TONSILLECTOMY     TONSILLECTOMY AND ADENOIDECTOMY     TONSILLECTOMY AND ADENOIDECTOMY     WISDOM TOOTH EXTRACTION      OB History     Gravida  2   Para  2   Term      Preterm      AB      Living         SAB      IAB      Ectopic      Multiple      Live Births               Home Medications    Prior to Admission medications   Medication Sig Start Date End Date Taking? Authorizing Provider  predniSONE (DELTASONE) 20 MG tablet Take 2 tablets (40 mg total) by mouth daily with breakfast.  04/08/22  Yes Particia Nearing, PA-C  albuterol (PROVENTIL) (2.5 MG/3ML) 0.083% nebulizer solution Take 3 mLs (2.5 mg total) by nebulization every 6 (six) hours as needed for wheezing or shortness of breath. 05/03/21   Barbette Merino, NP  albuterol (VENTOLIN HFA) 108 (90 Base) MCG/ACT inhaler Inhale 2 puffs into the lungs every 6 (six) hours as needed for wheezing or shortness of breath. 04/16/21   Barbette Merino, NP  cyclobenzaprine (FLEXERIL) 10 MG tablet Take 1 tablet (10 mg total) by mouth 2 (two) times daily as needed for muscle spasms. Do not drink alcohol or drive while taking this medication.  May cause drowsiness. 04/08/22   Particia Nearing, PA-C  EPINEPHrine (EPIPEN 2-PAK) 0.3 mg/0.3 mL IJ SOAJ injection Inject 0.3 mg into the muscle as needed for anaphylaxis. 02/06/22   Barbette Merino, NP  ferrous gluconate (FERGON) 324 MG tablet Take 1 tablet (324 mg total) by mouth daily with breakfast. 01/07/22 01/07/23  Barbette Merino, NP  fluticasone (FLONASE) 50 MCG/ACT nasal spray Place 2 sprays into both nostrils daily. 10/23/20  Barbette Merino, NP  hydrOXYzine (VISTARIL) 25 MG capsule Take 1 capsule (25 mg total) by mouth every 8 (eight) hours as needed. 02/06/22   Barbette Merino, NP  ibuprofen (ADVIL,MOTRIN) 200 MG tablet Take 400 mg by mouth every 6 (six) hours as needed for headache or mild pain.    [provider]  levocetirizine (XYZAL) 5 MG tablet Take 1 tablet (5 mg total) by mouth every evening. 03/21/21 03/21/22  Barbette Merino, NP  montelukast (SINGULAIR) 10 MG tablet Take 1 tablet (10 mg total) by mouth at bedtime. 10/08/21 10/08/22  Barbette Merino, NP  triamcinolone cream (KENALOG) 0.1 % Apply 1 application topically 2 (two) times daily. Apply for 2 weeks. May use on face 06/21/21   Barbette Merino, NP    Family History Family History  Problem Relation Age of Onset   Cancer Mother    HIV Mother    Diabetes Maternal Grandmother    Diabetes Paternal Grandmother     Heart failure Father     Social History Social History   Tobacco Use   Smoking status: Former    Types: Cigarettes   Smokeless tobacco: Never  Vaping Use   Vaping Use: Never used  Substance Use Topics   Alcohol use: No   Drug use: No     Allergies   Codeine, Amoxicillin, Peanut allergen powder-dnfp, Peanut-containing drug products, and Shrimp [shellfish allergy]   Review of Systems Review of Systems PER HPI  Physical Exam Triage Vital Signs ED Triage Vitals  Enc Vitals Group     BP 04/08/22 1411 118/79     Pulse Rate 04/08/22 1411 89     Resp 04/08/22 1411 17     Temp 04/08/22 1411 98.3 F (36.8 C)     Temp Source 04/08/22 1411 Oral     SpO2 04/08/22 1411 98 %     Weight --      Height --      Head Circumference --      Peak Flow --      Pain Score 04/08/22 1415 8     Pain Loc --      Pain Edu? --      Excl. in GC? --    No data found.  Updated Vital Signs BP 118/79 (BP Location: Left Arm)   Pulse 89   Temp 98.3 F (36.8 C) (Oral)   Resp 17   LMP 03/29/2022   SpO2 98%   Visual Acuity Right Eye Distance:   Left Eye Distance:   Bilateral Distance:    Right Eye Near:   Left Eye Near:    Bilateral Near:     Physical Exam Vitals and nursing note reviewed.  Constitutional:      Appearance: Normal appearance.  HENT:     Head: Atraumatic.     Right Ear: Tympanic membrane and external ear normal.     Left Ear: Tympanic membrane and external ear normal.     Nose: Rhinorrhea present. No congestion.     Mouth/Throat:     Mouth: Mucous membranes are moist.     Pharynx: Posterior oropharyngeal erythema present.  Eyes:     Extraocular Movements: Extraocular movements intact.     Conjunctiva/sclera: Conjunctivae normal.  Cardiovascular:     Rate and Rhythm: Normal rate and regular rhythm.     Heart sounds: Normal heart sounds.  Pulmonary:     Effort: Pulmonary effort is normal.     Breath sounds: Normal  breath sounds. No wheezing or rales.   Musculoskeletal:        General: Tenderness present. No swelling. Normal range of motion.     Cervical back: Normal range of motion and neck supple.     Comments: Ttp right SCM and trapezius. No midline spinal ttp diffusely. ROM intact across body  Skin:    General: Skin is warm and dry.  Neurological:     Mental Status: She is alert and oriented to person, place, and time.     Comments: B/l UEs neurovascularly intact  Psychiatric:        Mood and Affect: Mood normal.        Thought Content: Thought content normal.     UC Treatments / Results  Labs (all labs ordered are listed, but only abnormal results are displayed) Labs Reviewed  COVID-19, FLU A+B NAA   Narrative:    Performed at:  [address] - [facility] [address], [place], [place]  [id number] Lab Director: Jolene SchimkeSanjai Nagendra MD, Phone:  [phone]    EKG   Radiology No results found.  Procedures Procedures (including critical care time)  Medications Ordered in UC Medications - No data to display  Initial Impression / Assessment and Plan / UC Course  I have reviewed the triage vital signs and the nursing notes.  Pertinent labs & imaging results that were available during my care of the patient were reviewed by me and considered in my medical decision making (see chart for details).     Consistent with cervical radiculitis, treat with prednisone, flexeril, stretches, heat. COVID and flu pending, discussed OTC cold and congestion medications, supportive care, return precautions.   Final Clinical Impressions(s) / UC Diagnoses   Final diagnoses:  Viral URI with cough  Cervical radiculitis   Discharge Instructions   None    ED Prescriptions     Medication Sig Dispense Auth. Provider   predniSONE (DELTASONE) 20 MG tablet Take 2 tablets (40 mg total) by mouth daily with breakfast. 10 tablet Particia NearingLane, Lovelace Cerveny Elizabeth, PA-C   cyclobenzaprine (FLEXERIL) 10 MG tablet Take 1 tablet (10 mg total) by mouth 2 (two)  times daily as needed for muscle spasms. Do not drink alcohol or drive while taking this medication.  May cause drowsiness. 20 tablet Particia NearingLane, Wava Kildow Elizabeth, [place]-C      PDMP not reviewed this encounter.   Particia NearingLane, Shamar Kracke Elizabeth, [place] 04/12/22 2358

## 2022-04-18 ENCOUNTER — Encounter: Payer: Self-pay | Admitting: Obstetrics and Gynecology

## 2022-04-18 ENCOUNTER — Other Ambulatory Visit: Payer: Self-pay

## 2022-04-18 ENCOUNTER — Encounter (HOSPITAL_BASED_OUTPATIENT_CLINIC_OR_DEPARTMENT_OTHER): Payer: Self-pay | Admitting: Obstetrics and Gynecology

## 2022-04-18 ENCOUNTER — Encounter: Payer: Medicaid Other | Admitting: Obstetrics and Gynecology

## 2022-04-18 ENCOUNTER — Ambulatory Visit (INDEPENDENT_AMBULATORY_CARE_PROVIDER_SITE_OTHER): Payer: Medicaid Other | Admitting: Obstetrics and Gynecology

## 2022-04-18 VITALS — BP 122/77 | HR 97 | Ht 64.0 in | Wt 285.5 lb

## 2022-04-18 DIAGNOSIS — N840 Polyp of corpus uteri: Secondary | ICD-10-CM | POA: Diagnosis not present

## 2022-04-18 NOTE — Progress Notes (Signed)
Ms Burrowes presents for eval of uterine polyps. W/U for [facility] showed polyps. Cycles monthly, last 7 days, heavy, cramps and clots Pap smear and mammogram UTD Same sex relationship TSVD x 2  PE AF VSS Chaperone present  Lungs clear Heart RRR Abd soft + BS GU Nl EGBUS, cervix no lesions, uterus small, mobile  A/P  Endometrial polyps         [facility]  Hysteroscopy D & C reviewed with pt. R/B/Post op care discussed Information provided Will f/u with post op appt.

## 2022-04-18 NOTE — Progress Notes (Signed)
Spoke w/ via phone for pre-op interview--- Patient Lab needs dos----  CBC, UPT             Lab results------ COVID test -----patient states asymptomatic no test needed Arrive at ------- 7:45 am NPO after MN NO Solid Food.  Clear liquids from MN until--- 6:45 Med rec completed Medications to take morning of surgery ----- Albuterol Inhaler and nebulizer if needed, Flexeril, Ferrous Gluconate, Vistaril Diabetic medication ----- N/A Patient instructed no nail polish to be worn day of surgery Yes Patient instructed to bring photo id and insurance card day of surgery Patient aware to have Driver (ride ) / caregiver    for 24 hours after surgery Melissa Guerra Fiance Patient Special Instructions ----- Bring inhalers Pre-Op special Istructions ----- Patient verbalized understanding of instructions that were given at this phone interview. Patient denies shortness of breath, chest pain, fever, cough at this phone interview.

## 2022-04-18 NOTE — Patient Instructions (Signed)
Hysteroscopy ?Hysteroscopy is a procedure used to look inside a woman's womb (uterus). This may be done for various reasons, including: ?To look for tumors and other growths in the uterus. ?To evaluate abnormal bleeding, fibroid tumors, polyps, scar tissue, or uterine cancer. ?To determine why a woman is unable to get pregnant or has had repeated pregnancy losses. ?To locate an IUD (intrauterine device). ?To place a birth control device into the fallopian tubes. ?During this procedure, a thin, flexible tube with a small light and camera (hysteroscope) is used to examine the uterus. The camera sends images to a monitor in the room so that your health care provider can view the inside of your uterus. A hysteroscopy should be done right after a menstrual period. ?Tell a health care provider about: ?Any allergies you have. ?All medicines you are taking, including vitamins, herbs, eye drops, creams, and over-the-counter medicines. ?Any problems you or family members have had with anesthetic medicines. ?Any blood disorders you have. ?Any surgeries you have had. ?Any medical conditions you have. ?Whether you are pregnant or may be pregnant. ?Whether you have been diagnosed with an STI (sexually transmitted infection) or you think you have an STI. ?What are the risks? ?Generally, this is a safe procedure. However, problems may occur, including: ?Excessive bleeding. ?Infection. ?Damage to the uterus or other structures or organs. ?Allergic reaction to medicines or fluids that are used in the procedure. ?What happens before the procedure? ?Staying hydrated ?Follow instructions from your health care provider about hydration, which may include: ?Up to 2 hours before the procedure - you may continue to drink clear liquids, such as water, clear fruit juice, black coffee, and plain tea. ?Eating and drinking restrictions ?Follow instructions from your health care provider about eating and drinking, which may include: ?8 hours  before the procedure - stop eating solid foods and drink clear liquids only. ?2 hours before the procedure - stop drinking clear liquids. ?Medicines ?Ask your health care provider about: ?Changing or stopping your regular medicines. This is especially important if you are taking diabetes medicines or blood thinners. ?Taking medicines such as aspirin and ibuprofen. These medicines can thin your blood. Do not take these medicines unless your health care provider tells you to take them. ?Taking over-the-counter medicines, vitamins, herbs, and supplements. ?Medicine may be placed in your cervix the day before the procedure. This medicine causes the cervix to open (dilate). The larger opening makes it easier for the hysteroscope to be inserted into the uterus during the procedure. ?General instructions ?Ask your health care provider: ?What steps will be taken to help prevent infection. These steps may include: ?Washing skin with a germ-killing soap. ?Taking antibiotic medicine. ?Do not use any products that contain nicotine or tobacco for at least 4 weeks before the procedure. These products include cigarettes, chewing tobacco, and vaping devices, such as e-cigarettes. If you need help quitting, ask your health care provider. ?Plan to have a responsible adult take you home from the hospital or clinic. ?Plan to have a responsible adult care for you for the time you are told after you leave the hospital or clinic. This is important. ?Empty your bladder before the procedure begins. ?What happens during the procedure? ?An IV will be inserted into one of your veins. ?You may be given: ?A medicine to help you relax (sedative). ?A medicine that numbs the area around the cervix (local anesthetic). ?A medicine to make you fall asleep (general anesthetic). ?A hysteroscope will be inserted through your vagina   and into your uterus. ?Air or fluid will be used to enlarge your uterus to allow your health care provider to see it better.  The amount of fluid used will be carefully checked throughout the procedure. ?In some cases, tissue may be gently scraped from inside the uterus and sent to a lab for testing (biopsy). ?The procedure may vary among health care providers and hospitals. ?What can I expect after the procedure? ?Your blood pressure, heart rate, breathing rate, and blood oxygen level will be monitored until you leave the hospital or clinic. ?You may have cramps. You may be given medicines for this. ?You may have bleeding, which may vary from light spotting to menstrual-like bleeding. This is normal. ?If you had a biopsy, it is up to you to get the results. Ask your health care provider, or the department that is doing the procedure, when your results will be ready. ?Follow these instructions at home: ?Activity ?Rest as told by your health care provider. ?Return to your normal activities as told by your health care provider. Ask your health care provider what activities are safe for you. ?If you were given a sedative during the procedure, it can affect you for several hours. Do not drive or operate machinery until your health care provider says that it is safe. ?Medicines ?Do not take aspirin or other NSAIDs during recovery, as told by your healthcare provider. It can increase the risk of bleeding. ?Ask your health care provider if the medicine prescribed to you: ?Requires you to avoid driving or using machinery. ?Can cause constipation. You may need to take these actions to prevent or treat constipation: ?Drink enough fluid to keep your urine pale yellow. ?Take over-the-counter or prescription medicines. ?Eat foods that are high in fiber, such as beans, whole grains, and fresh fruits and vegetables. ?Limit foods that are high in fat and processed sugars, such as fried or sweet foods. ?General instructions ?Do not douche, use tampons, or have sex for 2 weeks after the procedure, or until your health care provider approves. ?Do not take  baths, swim, or use a hot tub until your health care provider approves. Take showers instead of baths for 2 weeks, or for as long as told by your health care provider. ?Keep all follow-up visits. This is important. ?Contact a health care provider if: ?You feel dizzy or lightheaded. ?You feel nauseous. ?You have abnormal vaginal discharge. ?You have a rash. ?You have pain that does not get better with medicine. ?You have chills. ?Get help right away if: ?You have bleeding that is heavier than a normal menstrual period. ?You have a fever. ?You have pain or cramps that get worse. ?You develop new abdominal pain. ?You faint. ?You have pain in your shoulder. ?You are short of breath. ?Summary ?Hysteroscopy is a procedure that is used to look inside a woman's womb (uterus). ?After the procedure, you may have bleeding, which varies from light spotting to menstrual-like bleeding. This is normal. You may also have cramps. ?Do not douche, use tampons, or have sex for 2 weeks after the procedure, or until your health care provider approves. ?Plan to have a responsible adult take you home from the hospital or clinic. ?This information is not intended to replace advice given to you by your health care provider. Make sure you discuss any questions you have with your health care provider. ?Document Revised: 10/01/2021 Document Reviewed: 06/21/2020 ?Elsevier Patient Education ? 2023 Elsevier Inc. ? ?

## 2022-04-21 NOTE — H&P (Signed)
Melissa Guerra is an 41 y.o. female with endometrial polyps. Surgerical management reviewed with pt.   Menstrual History: Menarche age: 76 Patient's last menstrual period was 03/27/2022.    Past Medical History:  Diagnosis Date   Abnormal Pap smear    Acid reflux    Anemia    Anxiety    Arthritis    Asthma    Depression    Dyspnea    Obesity    Substance abuse (HCC)     Past Surgical History:  Procedure Laterality Date   FOOT SURGERY Bilateral    TONSILLECTOMY     TONSILLECTOMY AND ADENOIDECTOMY     WISDOM TOOTH EXTRACTION      Family History  Problem Relation Age of Onset   Cancer Mother    HIV Mother    Diabetes Maternal Grandmother    Diabetes Paternal Grandmother    Heart failure Father     Social History:  reports that she has quit smoking. Her smoking use included cigarettes. She has never used smokeless tobacco. She reports that she does not drink alcohol and does not use drugs.  Allergies:  Allergies  Allergen Reactions   Codeine Anaphylaxis and Other (See Comments)    Patient was hospitalized    Amoxicillin Hives and Itching   Peanut Allergen Powder-Dnfp    Peanut-Containing Drug Products Hives and Itching   Shrimp [Shellfish Allergy]     No medications prior to admission.    Review of Systems  Constitutional: Negative.   Respiratory: Negative.    Cardiovascular: Negative.   Gastrointestinal: Negative.   Genitourinary:  Positive for menstrual problem.   Weight 129.3 kg, last menstrual period 03/27/2022. Physical Exam Constitutional:      Appearance: Normal appearance.  Cardiovascular:     Rate and Rhythm: Normal rate and regular rhythm.  Pulmonary:     Effort: Pulmonary effort is normal.     Breath sounds: Normal breath sounds.  Abdominal:     General: Bowel sounds are normal.     Palpations: Abdomen is soft.  Genitourinary:    Comments: Nl EGBUS, uterus small, mobile, no masses Neurological:     Mental Status: She is alert.     No results found for this or any previous visit (from the past 24 hour(s)).  No results found.  Assessment/Plan: Endometrial polyp  Hysteroscopy D & C reviewed with pt. R/B/Post op care reviewed. Pt has verbalized understanding and agrees to proceed.  Hermina Staggers 04/21/2022, 9:35 PM

## 2022-04-23 ENCOUNTER — Telehealth: Payer: Self-pay

## 2022-04-23 ENCOUNTER — Ambulatory Visit
Admission: RE | Admit: 2022-04-23 | Discharge: 2022-04-23 | Disposition: A | Discharge status: Home / Self Care | Payer: Medicaid Other | Source: Ambulatory Visit | Attending: Student | Admitting: Student

## 2022-04-23 ENCOUNTER — Other Ambulatory Visit: Payer: Self-pay

## 2022-04-23 VITALS — BP 120/83 | HR 93 | Temp 98.0°F | Resp 18

## 2022-04-23 DIAGNOSIS — Z1152 Encounter for screening for COVID-19: Secondary | ICD-10-CM | POA: Diagnosis not present

## 2022-04-23 DIAGNOSIS — B349 Viral infection, unspecified: Secondary | ICD-10-CM

## 2022-04-23 DIAGNOSIS — J4521 Mild intermittent asthma with (acute) exacerbation: Secondary | ICD-10-CM

## 2022-04-23 MED ORDER — PREDNISONE 20 MG PO TABS
40.0000 mg | ORAL_TABLET | Freq: Every day | ORAL | 0 refills | Status: AC
Start: 2022-04-23 — End: 2022-04-28

## 2022-04-23 NOTE — Telephone Encounter (Signed)
Called and spoke to patient, confirmed she needed to cancel surgery for tomorrow 06/07, rescheduled on Dr. Alysia Penna next available 06/27, patient agreed to this date.

## 2022-04-23 NOTE — ED Provider Notes (Signed)
[name]    CSN: [id number] Arrival date & time: 04/23/22  1059      History   Chief Complaint Chief Complaint  Patient presents with   Appointment    1100   Cough    HPI Melissa Guerra is a 41 y.o. female presenting with myalgias and cough for 3 days.  History asthma, currently controlled on albuterol inhaler and nebulizer, which she is using a total of 2-3 times daily with improvement.  Cough is nonproductive, and there is no associated dyspnea or wheezing.  She does endorse nasal congestion, nasal burning, pain radiating to her ears.  Denies hearing changes, dizziness, tinnitus.  Subjective chills, has not monitored temperature at home.  Mucinex providing some relief.    HPI  Past Medical History:  Diagnosis Date   Abnormal Pap smear    Acid reflux    Anemia    Anxiety    Arthritis    Asthma    Depression    Dyspnea    Obesity    Substance abuse ([place])     Patient Active Problem List   Diagnosis Date Noted   Uterine polyp 12/19/2021   Mild intermittent asthma without complication 123XX123   COVID-19 05/15/2021   Class 3 severe obesity due to excess calories without serious comorbidity with body mass index (BMI) of 45.0 to 49.9 in adult [facility]) 03/19/2021   Exposure to COVID-19 virus 09/28/2020   Acute non-recurrent frontal sinusitis 09/28/2020   CIN II (cervical intraepithelial neoplasia II) 01/02/2012    Past Surgical History:  Procedure Laterality Date   FOOT SURGERY Bilateral    TONSILLECTOMY     TONSILLECTOMY AND ADENOIDECTOMY     WISDOM TOOTH EXTRACTION      OB History     Gravida  2   Para  2   Term      Preterm      AB      Living         SAB      IAB      Ectopic      Multiple      Live Births               Home Medications    Prior to Admission medications   Medication Sig Start Date End Date Taking? Authorizing Provider  predniSONE (DELTASONE) 20 MG tablet Take 2 tablets (40 mg total) by mouth daily  for 5 days. Take with breakfast or lunch. Avoid NSAIDs (ibuprofen, etc) while taking this medication. 04/23/22 04/28/22 Yes Hazel Sams, PA-C  albuterol (PROVENTIL) (2.5 MG/3ML) 0.083% nebulizer solution Take 3 mLs (2.5 mg total) by nebulization every 6 (six) hours as needed for wheezing or shortness of breath. 05/03/21   Vevelyn Francois, NP  albuterol (VENTOLIN HFA) 108 (90 Base) MCG/ACT inhaler Inhale 2 puffs into the lungs every 6 (six) hours as needed for wheezing or shortness of breath. 04/16/21   Vevelyn Francois, NP  cyclobenzaprine (FLEXERIL) 10 MG tablet Take 1 tablet (10 mg total) by mouth 2 (two) times daily as needed for muscle spasms. Do not drink alcohol or drive while taking this medication.  May cause drowsiness. 04/08/22   Volney American, PA-C  EPINEPHrine (EPIPEN 2-PAK) 0.3 mg/0.3 mL IJ SOAJ injection Inject 0.3 mg into the muscle as needed for anaphylaxis. 02/06/22   Vevelyn Francois, NP  ferrous gluconate (FERGON) 324 MG tablet Take 1 tablet (324 mg total) by mouth daily with breakfast.  01/07/22 01/07/23  Vevelyn Francois, NP  fluticasone (FLONASE) 50 MCG/ACT nasal spray Place 2 sprays into both nostrils daily. 10/23/20   Vevelyn Francois, NP  hydrOXYzine (VISTARIL) 25 MG capsule Take 1 capsule (25 mg total) by mouth every 8 (eight) hours as needed. 02/06/22   Vevelyn Francois, NP  ibuprofen (ADVIL,MOTRIN) 200 MG tablet Take 400 mg by mouth every 6 (six) hours as needed for headache or mild pain.    [provider]  levocetirizine (XYZAL) 5 MG tablet Take 1 tablet (5 mg total) by mouth every evening. 03/21/21 03/21/22  Vevelyn Francois, NP  montelukast (SINGULAIR) 10 MG tablet Take 1 tablet (10 mg total) by mouth at bedtime. 10/08/21 10/08/22  Vevelyn Francois, NP  triamcinolone cream (KENALOG) 0.1 % Apply 1 application topically 2 (two) times daily. Apply for 2 weeks. May use on face Patient taking differently: Apply 1 application. topically 2 (two) times daily as needed. Apply for 2  weeks. May use on face 06/21/21   Vevelyn Francois, NP    Family History Family History  Problem Relation Age of Onset   Cancer Mother    HIV Mother    Diabetes Maternal Grandmother    Diabetes Paternal Grandmother    Heart failure Father     Social History Social History   Tobacco Use   Smoking status: Former    Types: Cigarettes   Smokeless tobacco: Never  Vaping Use   Vaping Use: Never used  Substance Use Topics   Alcohol use: No   Drug use: No     Allergies   Codeine, Amoxicillin, Peanut allergen powder-dnfp, Peanut-containing drug products, and Shrimp [shellfish allergy]   Review of Systems Review of Systems  Constitutional:  Negative for appetite change, chills and fever.  HENT:  Positive for congestion. Negative for ear pain, rhinorrhea, sinus pressure, sinus pain and sore throat.   Eyes:  Negative for redness and visual disturbance.  Respiratory:  Positive for cough. Negative for chest tightness, shortness of breath and wheezing.   Cardiovascular:  Negative for chest pain and palpitations.  Gastrointestinal:  Negative for abdominal pain, constipation, diarrhea, nausea and vomiting.  Genitourinary:  Negative for dysuria, frequency and urgency.  Musculoskeletal:  Negative for myalgias.  Neurological:  Negative for dizziness, weakness and headaches.  Psychiatric/Behavioral:  Negative for confusion.   All other systems reviewed and are negative.   Physical Exam Triage Vital Signs ED Triage Vitals [04/23/22 1130]  Enc Vitals Group     BP 120/83     Pulse Rate 93     Resp 18     Temp 98 F (36.7 C)     Temp Source Oral     SpO2 98 %     Weight      Height      Head Circumference      Peak Flow      Pain Score 8     Pain Loc      Pain Edu?      Excl. in [place]?    No data found.  Updated Vital Signs BP 120/83 (BP Location: Left Arm)   Pulse 93   Temp 98 F (36.7 C) (Oral)   Resp 18   LMP 03/29/2022   SpO2 98%   Visual Acuity Right Eye  Distance:   Left Eye Distance:   Bilateral Distance:    Right Eye Near:   Left Eye Near:    Bilateral Near:  Physical Exam Vitals reviewed.  Constitutional:      General: She is not in acute distress.    Appearance: Normal appearance. She is not ill-appearing.  HENT:     Head: Normocephalic and atraumatic.     Right Ear: Tympanic membrane, ear canal and external ear normal. No tenderness. No middle ear effusion. There is no impacted cerumen. Tympanic membrane is not perforated, erythematous, retracted or bulging.     Left Ear: Tympanic membrane, ear canal and external ear normal. No tenderness.  No middle ear effusion. There is no impacted cerumen. Tympanic membrane is not perforated, erythematous, retracted or bulging.     Nose: Nose normal. No congestion.     Mouth/Throat:     Mouth: Mucous membranes are moist.     Pharynx: Uvula midline. No oropharyngeal exudate or posterior oropharyngeal erythema.  Eyes:     Extraocular Movements: Extraocular movements intact.     Pupils: Pupils are equal, round, and reactive to light.  Cardiovascular:     Rate and Rhythm: Normal rate and regular rhythm.     Heart sounds: Normal heart sounds.  Pulmonary:     Effort: Pulmonary effort is normal.     Breath sounds: Decreased breath sounds present. No wheezing, rhonchi or rales.     Comments: Breath sounds decreased throughout but no wheezing rhonchi or rales. Abdominal:     Palpations: Abdomen is soft.     Tenderness: There is no abdominal tenderness. There is no guarding or rebound.  Lymphadenopathy:     Cervical: No cervical adenopathy.     Right cervical: No superficial cervical adenopathy.    Left cervical: No superficial cervical adenopathy.  Neurological:     General: No focal deficit present.     Mental Status: She is alert and oriented to person, place, and time.  Psychiatric:        Mood and Affect: Mood normal.        Behavior: Behavior normal.        Thought Content: Thought  content normal.        Judgment: Judgment normal.     UC Treatments / Results  Labs (all labs ordered are listed, but only abnormal results are displayed) Labs Reviewed  COVID-19, FLU A+B NAA    EKG   Radiology No results found.  Procedures Procedures (including critical care time)  Medications Ordered in UC Medications - No data to display  Initial Impression / Assessment and Plan / UC Course  I have reviewed the triage vital signs and the nursing notes.  Pertinent labs & imaging results that were available during my care of the patient were reviewed by me and considered in my medical decision making (see chart for details).     This patient is a very pleasant 41 y.o. year old female presenting with asthma flare related to viral syndrome. Today this pt is afebrile nontachycardic nontachypneic, oxygenating well on room air, no wheezes rhonchi or rales.   Covid and influenza PCR sent .  Low-dose prednisone for asthma flare. Continue albuterol.  ED return precautions discussed. Patient verbalizes understanding and agreement.  .   Final Clinical Impressions(s) / UC Diagnoses   Final diagnoses:  Viral syndrome  Mild intermittent asthma with (acute) exacerbation  Encounter for screening for COVID-19     Discharge Instructions      -Prednisone, 2 pills taken at the same time for 5 days in a row.  Try taking this earlier in the day as it can  give you energy. Avoid NSAIDs like ibuprofen and alleve while taking this medication as they can increase your risk of stomach upset and even GI bleeding when in combination with a steroid. You can continue tylenol (acetaminophen) up to 1000mg  3x daily. -Albuterol inhaler as needed for cough, wheezing, shortness of breath, 1 to 2 puffs every 6 hours as needed. -Mucinex for congestion and cough  -With a virus, you're typically contagious for 5-7 days, or as long as you're having fevers.  -Follow-up if symptoms worsen, including  cough, new shortness of breath, new chest pain, new fevers, etc.    ED Prescriptions     Medication Sig Dispense Auth. Provider   predniSONE (DELTASONE) 20 MG tablet Take 2 tablets (40 mg total) by mouth daily for 5 days. Take with breakfast or lunch. Avoid NSAIDs (ibuprofen, etc) while taking this medication. 10 tablet Hazel Sams, PA-C      PDMP not reviewed this encounter.   Hazel Sams, PA-C 04/23/22 1315

## 2022-04-23 NOTE — ED Triage Notes (Signed)
Pt here for body aches and cough x 3 days 

## 2022-04-23 NOTE — Discharge Instructions (Addendum)
-  Prednisone, 2 pills taken at the same time for 5 days in a row.  Try taking this earlier in the day as it can give you energy. Avoid NSAIDs like ibuprofen and alleve while taking this medication as they can increase your risk of stomach upset and even GI bleeding when in combination with a steroid. You can continue tylenol (acetaminophen) up to 1000mg  3x daily. -Albuterol inhaler as needed for cough, wheezing, shortness of breath, 1 to 2 puffs every 6 hours as needed. -Mucinex for congestion and cough  -With a virus, you're typically contagious for 5-7 days, or as long as you're having fevers.  -Follow-up if symptoms worsen, including cough, new shortness of breath, new chest pain, new fevers, etc.

## 2022-04-24 DIAGNOSIS — N840 Polyp of corpus uteri: Secondary | ICD-10-CM

## 2022-04-24 LAB — COVID-19, FLU A+B NAA
Influenza A, NAA: NOT DETECTED
Influenza B, NAA: NOT DETECTED
SARS-CoV-2, NAA: NOT DETECTED

## 2022-05-10 ENCOUNTER — Encounter (HOSPITAL_COMMUNITY): Payer: Self-pay | Admitting: Vascular Surgery

## 2022-05-10 ENCOUNTER — Encounter (HOSPITAL_COMMUNITY): Payer: Self-pay | Admitting: Obstetrics and Gynecology

## 2022-05-13 ENCOUNTER — Telehealth: Payer: Self-pay

## 2022-05-14 ENCOUNTER — Ambulatory Visit (HOSPITAL_COMMUNITY)
Admission: RE | Admit: 2022-05-14 | Payer: Medicaid Other | Source: Home / Self Care | Admitting: Obstetrics and Gynecology

## 2022-05-14 DIAGNOSIS — N840 Polyp of corpus uteri: Secondary | ICD-10-CM

## 2022-05-14 HISTORY — DX: Anemia, unspecified: D64.9

## 2022-05-14 HISTORY — DX: Anxiety disorder, unspecified: F41.9

## 2022-05-14 HISTORY — DX: Unspecified osteoarthritis, unspecified site: M19.90

## 2022-05-14 HISTORY — DX: Depression, unspecified: F32.A

## 2022-05-14 HISTORY — DX: Dyspnea, unspecified: R06.00

## 2022-05-14 HISTORY — DX: Other psychoactive substance abuse, uncomplicated: F19.10

## 2022-05-14 SURGERY — DILATATION AND CURETTAGE /HYSTEROSCOPY
Anesthesia: Choice

## 2022-06-04 ENCOUNTER — Ambulatory Visit
Admission: RE | Admit: 2022-06-04 | Discharge: 2022-06-04 | Disposition: A | Discharge status: Home / Self Care | Payer: Medicaid Other | Source: Ambulatory Visit | Attending: Physician Assistant | Admitting: Physician Assistant

## 2022-06-04 VITALS — BP 132/78 | HR 74 | Temp 98.1°F | Resp 18

## 2022-06-04 DIAGNOSIS — R609 Edema, unspecified: Secondary | ICD-10-CM | POA: Diagnosis not present

## 2022-06-04 DIAGNOSIS — J0120 Acute ethmoidal sinusitis, unspecified: Secondary | ICD-10-CM | POA: Diagnosis not present

## 2022-06-04 MED ORDER — CEFDINIR 300 MG PO CAPS: 300.0000 mg | ORAL_CAPSULE | Freq: Two times a day (BID) | ORAL | 0 refills | Status: DC

## 2022-06-04 NOTE — ED Triage Notes (Signed)
Pt is present with c/o sinus pressure, bilateral foot swelling, and neck pain radiating down to both shoulders.  Pt states that her neck/shoulder pain started one week ago.  Pt states that she noticed the swelling x3 days ago.  Pt states that her sinus pressure started x2 days ago.

## 2022-06-04 NOTE — Discharge Instructions (Addendum)
Return if any problems.

## 2022-06-12 NOTE — ED Provider Notes (Signed)
[name]    CSN: [id number] Arrival date & time: 06/04/22  1550      History   Chief Complaint Chief Complaint  Patient presents with   Neck Pain   Shoulder Pain   Leg Swelling    Feet swelling    sinus pressure     HPI Melissa Guerra is a 41 y.o. female.   T complains of a headache and sinus pressure.  Pt reports symptoms began several days ago.  Pt reports she also has discomfort in her neck and shoulders   The history is provided by the patient. No language interpreter was used.  Neck Pain Pain location:  Generalized neck Quality:  Aching Pain severity:  Moderate Pain is:  Worse during the night Timing:  Constant Progression:  Worsening Shoulder Pain Associated symptoms: neck pain     Past Medical History:  Diagnosis Date   Abnormal Pap smear    Acid reflux    Anemia    Anxiety    Arthritis    Asthma    Depression    Dyspnea    Obesity    Substance abuse ([place])     Patient Active Problem List   Diagnosis Date Noted   Uterine polyp 12/19/2021   Mild intermittent asthma without complication 123XX123   COVID-19 05/15/2021   Class 3 severe obesity due to excess calories without serious comorbidity with body mass index (BMI) of 45.0 to 49.9 in adult [facility]) 03/19/2021   Exposure to COVID-19 virus 09/28/2020   Acute non-recurrent frontal sinusitis 09/28/2020   CIN II (cervical intraepithelial neoplasia II) 01/02/2012    Past Surgical History:  Procedure Laterality Date   FOOT SURGERY Bilateral    TONSILLECTOMY AND ADENOIDECTOMY     WISDOM TOOTH EXTRACTION      OB History     Gravida  2   Para  2   Term      Preterm      AB      Living         SAB      IAB      Ectopic      Multiple      Live Births               Home Medications    Prior to Admission medications   Medication Sig Start Date End Date Taking? Authorizing Provider  cefdinir (OMNICEF) 300 MG capsule Take 1 capsule (300 mg total) by mouth 2  (two) times daily. 06/04/22  Yes Caryl Ada K, PA-C  albuterol (PROVENTIL) (2.5 MG/3ML) 0.083% nebulizer solution Take 3 mLs (2.5 mg total) by nebulization every 6 (six) hours as needed for wheezing or shortness of breath. 05/03/21   Vevelyn Francois, NP  albuterol (VENTOLIN HFA) 108 (90 Base) MCG/ACT inhaler Inhale 2 puffs into the lungs every 6 (six) hours as needed for wheezing or shortness of breath. 04/16/21   Vevelyn Francois, NP  cyclobenzaprine (FLEXERIL) 10 MG tablet Take 1 tablet (10 mg total) by mouth 2 (two) times daily as needed for muscle spasms. Do not drink alcohol or drive while taking this medication.  May cause drowsiness. 04/08/22   Volney American, PA-C  EPINEPHrine (EPIPEN 2-PAK) 0.3 mg/0.3 mL IJ SOAJ injection Inject 0.3 mg into the muscle as needed for anaphylaxis. 02/06/22   Vevelyn Francois, NP  ferrous gluconate (FERGON) 324 MG tablet Take 1 tablet (324 mg total) by mouth daily with breakfast. 01/07/22 01/07/23  Thad Ranger M, NP  fluticasone [facility]) 50 MCG/ACT nasal spray Place 2 sprays into both nostrils daily. 10/23/20   Barbette Merino, NP  hydrOXYzine (VISTARIL) 25 MG capsule Take 1 capsule (25 mg total) by mouth every 8 (eight) hours as needed. 02/06/22   Barbette Merino, NP  ibuprofen (ADVIL,MOTRIN) 200 MG tablet Take 400 mg by mouth every 6 (six) hours as needed for headache or mild pain.    [provider]  levocetirizine (XYZAL) 5 MG tablet Take 1 tablet (5 mg total) by mouth every evening. 03/21/21 03/21/22  Barbette Merino, NP  montelukast (SINGULAIR) 10 MG tablet Take 1 tablet (10 mg total) by mouth at bedtime. 10/08/21 10/08/22  Barbette Merino, NP  triamcinolone cream (KENALOG) 0.1 % Apply 1 application topically 2 (two) times daily. Apply for 2 weeks. May use on face Patient taking differently: Apply 1 application. topically 2 (two) times daily as needed. Apply for 2 weeks. May use on face 06/21/21   Barbette Merino, NP    Family History Family History   Problem Relation Age of Onset   Cancer Mother    HIV Mother    Diabetes Maternal Grandmother    Diabetes Paternal Grandmother    Heart failure Father     Social History Social History   Tobacco Use   Smoking status: Former    Types: Cigarettes   Smokeless tobacco: Never  Vaping Use   Vaping Use: Never used  Substance Use Topics   Alcohol use: No   Drug use: No     Allergies   Codeine, Amoxicillin, Peanut allergen powder-dnfp, Peanut-containing drug products, and Shrimp [shellfish allergy]   Review of Systems Review of Systems  Musculoskeletal:  Positive for neck pain.  All other systems reviewed and are negative.    Physical Exam Triage Vital Signs ED Triage Vitals  Enc Vitals Group     BP 06/04/22 1626 132/78     Pulse Rate 06/04/22 1626 74     Resp 06/04/22 1626 18     Temp 06/04/22 1626 98.1 F (36.7 C)     Temp src --      SpO2 06/04/22 1626 99 %     Weight --      Height --      Head Circumference --      Peak Flow --      Pain Score 06/04/22 1625 7     Pain Loc --      Pain Edu? --      Excl. in [place]? --    No data found.  Updated Vital Signs BP 132/78   Pulse 74   Temp 98.1 F (36.7 C)   Resp 18   SpO2 99%   Visual Acuity Right Eye Distance:   Left Eye Distance:   Bilateral Distance:    Right Eye Near:   Left Eye Near:    Bilateral Near:     Physical Exam Constitutional:      Appearance: She is well-developed.  HENT:     Head: Normocephalic and atraumatic.     Mouth/Throat:     Pharynx: Posterior oropharyngeal erythema present.  Eyes:     Conjunctiva/sclera: Conjunctivae normal.     Pupils: Pupils are equal, round, and reactive to light.  Pulmonary:     Effort: Pulmonary effort is normal.  Abdominal:     Palpations: Abdomen is soft.  Musculoskeletal:        General: Normal range of motion.  Cervical back: Normal range of motion and neck supple.  Skin:    General: Skin is warm and dry.  Neurological:     General: No  focal deficit present.     Mental Status: She is alert and oriented to person, place, and time.      UC Treatments / Results  Labs (all labs ordered are listed, but only abnormal results are displayed) Labs Reviewed - No data to display  EKG   Radiology No results found.  Procedures Procedures (including critical care time)  Medications Ordered in UC Medications - No data to display  Initial Impression / Assessment and Plan / UC Course  I have reviewed the triage vital signs and the nursing notes.  Pertinent labs & imaging results that were available during my care of the patient were reviewed by me and considered in my medical decision making (see chart for details).     MDM:  Pt counseled on antibiotic treatment and sinus infection  Final Clinical Impressions(s) / UC Diagnoses   Final diagnoses:  Acute ethmoidal sinusitis, recurrence not specified  Edema, unspecified type     Discharge Instructions      Return if any problems.     ED Prescriptions     Medication Sig Dispense Auth. Provider   cefdinir (OMNICEF) 300 MG capsule Take 1 capsule (300 mg total) by mouth 2 (two) times daily. 20 capsule Elson Areas, [place]      PDMP not reviewed this encounter. An After Visit Summary was printed and given to the patient.    Elson Areas, [place] 06/12/22 1624

## 2022-08-19 ENCOUNTER — Ambulatory Visit
Admission: RE | Admit: 2022-08-19 | Discharge: 2022-08-19 | Disposition: A | Discharge status: Home / Self Care | Payer: Medicaid Other | Source: Ambulatory Visit | Attending: Physician Assistant | Admitting: Physician Assistant

## 2022-08-19 VITALS — BP 131/76 | HR 71 | Temp 98.8°F | Resp 18

## 2022-08-19 DIAGNOSIS — J4520 Mild intermittent asthma, uncomplicated: Secondary | ICD-10-CM | POA: Diagnosis not present

## 2022-08-19 DIAGNOSIS — J0100 Acute maxillary sinusitis, unspecified: Secondary | ICD-10-CM

## 2022-08-19 DIAGNOSIS — R051 Acute cough: Secondary | ICD-10-CM | POA: Diagnosis not present

## 2022-08-19 MED ORDER — FLUTICASONE PROPIONATE 50 MCG/ACT NA SUSP
2.0000 | Freq: Every day | NASAL | 2 refills | Status: AC
Start: 2022-08-19 — End: ?

## 2022-08-19 MED ORDER — ALBUTEROL SULFATE (2.5 MG/3ML) 0.083% IN NEBU: 2.5000 mg | INHALATION_SOLUTION | Freq: Four times a day (QID) | RESPIRATORY_TRACT | 12 refills | Status: DC | PRN

## 2022-08-19 MED ORDER — PREDNISONE 10 MG PO TABS: 10.0000 mg | ORAL_TABLET | Freq: Three times a day (TID) | ORAL | 0 refills | Status: DC

## 2022-08-19 MED ORDER — ALBUTEROL SULFATE HFA 108 (90 BASE) MCG/ACT IN AERS: 2.0000 | INHALATION_SPRAY | Freq: Four times a day (QID) | RESPIRATORY_TRACT | 11 refills | Status: DC | PRN

## 2022-08-19 MED ORDER — DM-GUAIFENESIN ER 30-600 MG PO TB12: 1.0000 | ORAL_TABLET | Freq: Two times a day (BID) | ORAL | 0 refills | Status: DC

## 2022-08-19 NOTE — ED Triage Notes (Signed)
Pt is present today with cough and nasal congestion. Pt sx started x2 days ago

## 2022-08-19 NOTE — Discharge Instructions (Addendum)
Advised to continue to use the albuterol inhaler, 2 puffs every 6 hours on a regular basis to help prevent wheezing and shortness of breath. Advised to use Mucinex DM every 12 hours for cough and chest congestion. Advise use of Flonase nasal spray 2 sprays each nostril once daily help open up the nasal and sinus passageways and relieve congestion. Advised take the prednisone 10 mg 3 times a day for 5 days only to help reduce the respiratory inflammatory process. As a follow-up PCP or return to urgent care if symptoms fail to improve.

## 2022-08-19 NOTE — ED Provider Notes (Signed)
[name]    CSN: [id number] Arrival date & time: 08/19/22  1212      History   Chief Complaint Chief Complaint  Patient presents with   Nasal Congestion    Cough no appetite left ear feels clogged chest congestion body pains nausea - Entered by patient   Cough    HPI Melissa Guerra is a 41 y.o. female.   42 year old female presents with cough and sinus congestion.  Patient indicates for the past 2 days she has been having increasing sinus congestion with maxillary sinus pressure, rhinitis that is mainly clear to yellow, postnasal drip that has been copious.  She relates she is also having some chest congestion with intermittent cough with clear to yellow production.  She relates having fatigue and some muscle aches and pains.  She indicates she does have a history of having asthma and she has been doing her inhaler because she has felt a little bit short winded since she has had this respiratory symptoms.  She indicates also that over the past 2 days she has been nauseated without any vomiting.  She indicates she is tolerating fluids well.  She relates she has not been around any family or friends that been sick.   Cough Associated symptoms: shortness of breath     Past Medical History:  Diagnosis Date   Abnormal Pap smear    Acid reflux    Anemia    Anxiety    Arthritis    Asthma    Depression    Dyspnea    Obesity    Substance abuse (HCC)     Patient Active Problem List   Diagnosis Date Noted   Uterine polyp 12/19/2021   Mild intermittent asthma without complication 09/13/2021   COVID-19 05/15/2021   Class 3 severe obesity due to excess calories without serious comorbidity with body mass index (BMI) of 45.0 to 49.9 in adult [facility]) 03/19/2021   Exposure to COVID-19 virus 09/28/2020   Acute non-recurrent frontal sinusitis 09/28/2020   CIN II (cervical intraepithelial neoplasia II) 01/02/2012    Past Surgical History:  Procedure Laterality Date    FOOT SURGERY Bilateral    TONSILLECTOMY AND ADENOIDECTOMY     WISDOM TOOTH EXTRACTION      OB History     Gravida  2   Para  2   Term      Preterm      AB      Living         SAB      IAB      Ectopic      Multiple      Live Births               Home Medications    Prior to Admission medications   Medication Sig Start Date End Date Taking? Authorizing Provider  dextromethorphan-guaiFENesin (MUCINEX DM) 30-600 MG 12hr tablet Take 1 tablet by mouth 2 (two) times daily. 08/19/22  Yes Ellsworth Lennox, PA-C  fluticasone [facility]) 50 MCG/ACT nasal spray Place 2 sprays into both nostrils daily. 08/19/22  Yes Ellsworth Lennox, PA-C  predniSONE (DELTASONE) 10 MG tablet Take 1 tablet (10 mg total) by mouth in the morning, at noon, and at bedtime. 08/19/22  Yes Ellsworth Lennox, PA-C  albuterol (PROVENTIL) (2.5 MG/3ML) 0.083% nebulizer solution Take 3 mLs (2.5 mg total) by nebulization every 6 (six) hours as needed for wheezing or shortness of breath. 08/19/22   Ellsworth Lennox, PA-C  albuterol (VENTOLIN  HFA) 108 (90 Base) MCG/ACT inhaler Inhale 2 puffs into the lungs every 6 (six) hours as needed for wheezing or shortness of breath. 08/19/22   Nyoka Lint, PA-C  cefdinir (OMNICEF) 300 MG capsule Take 1 capsule (300 mg total) by mouth 2 (two) times daily. 06/04/22   Fransico Meadow, PA-C  cyclobenzaprine (FLEXERIL) 10 MG tablet Take 1 tablet (10 mg total) by mouth 2 (two) times daily as needed for muscle spasms. Do not drink alcohol or drive while taking this medication.  May cause drowsiness. 04/08/22   Volney American, PA-C  EPINEPHrine (EPIPEN 2-PAK) 0.3 mg/0.3 mL IJ SOAJ injection Inject 0.3 mg into the muscle as needed for anaphylaxis. 02/06/22   Vevelyn Francois, NP  ferrous gluconate (FERGON) 324 MG tablet Take 1 tablet (324 mg total) by mouth daily with breakfast. 01/07/22 01/07/23  Vevelyn Francois, NP  fluticasone (FLONASE) 50 MCG/ACT nasal spray Place 2 sprays into both nostrils  daily. 10/23/20   Vevelyn Francois, NP  hydrOXYzine (VISTARIL) 25 MG capsule Take 1 capsule (25 mg total) by mouth every 8 (eight) hours as needed. 02/06/22   Vevelyn Francois, NP  ibuprofen (ADVIL,MOTRIN) 200 MG tablet Take 400 mg by mouth every 6 (six) hours as needed for headache or mild pain.    [provider]  levocetirizine (XYZAL) 5 MG tablet Take 1 tablet (5 mg total) by mouth every evening. 03/21/21 03/21/22  Vevelyn Francois, NP  montelukast (SINGULAIR) 10 MG tablet Take 1 tablet (10 mg total) by mouth at bedtime. 10/08/21 10/08/22  Vevelyn Francois, NP  triamcinolone cream (KENALOG) 0.1 % Apply 1 application topically 2 (two) times daily. Apply for 2 weeks. May use on face Patient taking differently: Apply 1 application. topically 2 (two) times daily as needed. Apply for 2 weeks. May use on face 06/21/21   Vevelyn Francois, NP    Family History Family History  Problem Relation Age of Onset   Cancer Mother    HIV Mother    Diabetes Maternal Grandmother    Diabetes Paternal Grandmother    Heart failure Father     Social History Social History   Tobacco Use   Smoking status: Former    Types: Cigarettes   Smokeless tobacco: Never  Vaping Use   Vaping Use: Never used  Substance Use Topics   Alcohol use: No   Drug use: No     Allergies   Codeine, Amoxicillin, Peanut allergen powder-dnfp, Peanut-containing drug products, and Shrimp [shellfish allergy]   Review of Systems Review of Systems  HENT:  Positive for postnasal drip, sinus pressure and sinus pain.   Respiratory:  Positive for cough and shortness of breath.      Physical Exam Triage Vital Signs ED Triage Vitals [08/19/22 1244]  Enc Vitals Group     BP 131/76     Pulse Rate 71     Resp 18     Temp 98.8 F (37.1 C)     Temp src      SpO2 99 %     Weight      Height      Head Circumference      Peak Flow      Pain Score 0     Pain Loc      Pain Edu?      Excl. in [place]?    No data found.  Updated  Vital Signs BP 131/76   Pulse 71   Temp 98.8  F (37.1 C)   Resp 18   SpO2 99%   Visual Acuity Right Eye Distance:   Left Eye Distance:   Bilateral Distance:    Right Eye Near:   Left Eye Near:    Bilateral Near:     Physical Exam Constitutional:      Appearance: Normal appearance.  HENT:     Right Ear: Ear canal normal. Tympanic membrane is injected.     Left Ear: Ear canal normal. Tympanic membrane is injected.     Mouth/Throat:     Mouth: Mucous membranes are moist.     Pharynx: Oropharynx is clear.     Comments: Sinus: Mild tenderness palpated maxillary sinuses bilaterally. Cardiovascular:     Rate and Rhythm: Normal rate and regular rhythm.     Heart sounds: Normal heart sounds.  Pulmonary:     Effort: Pulmonary effort is normal.     Breath sounds: Normal breath sounds and air entry. No wheezing, rhonchi or rales.  Lymphadenopathy:     Cervical: No cervical adenopathy.  Neurological:     Mental Status: She is alert.      UC Treatments / Results  Labs (all labs ordered are listed, but only abnormal results are displayed) Labs Reviewed - No data to display  EKG   Radiology No results found.  Procedures Procedures (including critical care time)  Medications Ordered in UC Medications - No data to display  Initial Impression / Assessment and Plan / UC Course  I have reviewed the triage vital signs and the nursing notes.  Pertinent labs & imaging results that were available during my care of the patient were reviewed by me and considered in my medical decision making (see chart for details).    Plan: 1.  The acute maxillary sinusitis will be treated with the following: A.  Prednisone 10 mg 3 times a day for 5 days only to reduce the inflammatory response. B.  Flonase nasal spray, 2 sprays each nostril once a day to decrease sinus congestion. 2.  The acute cough will be treated with the following: A.  Mucinex DM to help control cough or chest  congestion. 3.  The asthma will be continued to be controlled with the following: A.  Albuterol inhaler, 2 puffs every 6 hours as needed to control chest congestion or wheezing. 4.  Advised follow-up PCP or return to urgent care if symptoms fail to improve. Final Clinical Impressions(s) / UC Diagnoses   Final diagnoses:  Acute non-recurrent maxillary sinusitis  Acute cough  Mild intermittent asthma without complication     Discharge Instructions      Advised to continue to use the albuterol inhaler, 2 puffs every 6 hours on a regular basis to help prevent wheezing and shortness of breath. Advised to use Mucinex DM every 12 hours for cough and chest congestion. Advise use of Flonase nasal spray 2 sprays each nostril once daily help open up the nasal and sinus passageways and relieve congestion. Advised take the prednisone 10 mg 3 times a day for 5 days only to help reduce the respiratory inflammatory process. As a follow-up PCP or return to urgent care if symptoms fail to improve.    ED Prescriptions     Medication Sig Dispense Auth. Provider   albuterol (PROVENTIL) (2.5 MG/3ML) 0.083% nebulizer solution Take 3 mLs (2.5 mg total) by nebulization every 6 (six) hours as needed for wheezing or shortness of breath. 75 mL Ellsworth Lennox, PA-C   albuterol (VENTOLIN HFA)  108 (90 Base) MCG/ACT inhaler Inhale 2 puffs into the lungs every 6 (six) hours as needed for wheezing or shortness of breath. 8 g Ellsworth Lennox, PA-C   dextromethorphan-guaiFENesin [facility] DM) 30-600 MG 12hr tablet Take 1 tablet by mouth 2 (two) times daily. 14 tablet Ellsworth Lennox, PA-C   predniSONE (DELTASONE) 10 MG tablet Take 1 tablet (10 mg total) by mouth in the morning, at noon, and at bedtime. 15 tablet Ellsworth Lennox, PA-C   fluticasone [facility]) 50 MCG/ACT nasal spray Place 2 sprays into both nostrils daily. 11.1 mL Ellsworth Lennox, PA-C      PDMP not reviewed this encounter.   Ellsworth Lennox, PA-C 08/19/22 1338

## 2022-09-18 ENCOUNTER — Ambulatory Visit: Payer: Medicaid Other

## 2022-09-18 ENCOUNTER — Ambulatory Visit
Admission: RE | Admit: 2022-09-18 | Discharge: 2022-09-18 | Disposition: A | Discharge status: Home / Self Care | Payer: Medicaid Other | Source: Ambulatory Visit | Attending: Physician Assistant | Admitting: Physician Assistant

## 2022-09-18 VITALS — BP 132/75 | HR 80 | Temp 98.1°F | Resp 18

## 2022-09-18 DIAGNOSIS — B349 Viral infection, unspecified: Secondary | ICD-10-CM | POA: Diagnosis not present

## 2022-09-18 DIAGNOSIS — Z1152 Encounter for screening for COVID-19: Secondary | ICD-10-CM | POA: Diagnosis not present

## 2022-09-18 DIAGNOSIS — H65192 Other acute nonsuppurative otitis media, left ear: Secondary | ICD-10-CM | POA: Diagnosis not present

## 2022-09-18 MED ORDER — DOXYCYCLINE HYCLATE 100 MG PO CAPS: 100.0000 mg | ORAL_CAPSULE | Freq: Two times a day (BID) | ORAL | 0 refills | Status: DC

## 2022-09-18 NOTE — ED Triage Notes (Signed)
Pt presents with headache, and left ear pain since yesterday.

## 2022-09-18 NOTE — ED Provider Notes (Signed)
[name]    CSN: [id number] Arrival date & time: 09/18/22  1740      History   Chief Complaint Chief Complaint  Patient presents with   Headache   Otalgia    HPI Melissa Guerra is a 41 y.o. female.   Patient here today for evaluation of headache and left ear pain that started yesterday.  She reports she has had some congestion and mild cough as well.  She has not had any vomiting.  She has tried over-the-counter medication without resolution.  The history is provided by the patient.  Headache Associated symptoms: cough and ear pain   Associated symptoms: no abdominal pain, no fever, no nausea, no sore throat and no vomiting   Otalgia Associated symptoms: cough and headaches   Associated symptoms: no abdominal pain, no fever, no sore throat and no vomiting     Past Medical History:  Diagnosis Date   Abnormal Pap smear    Acid reflux    Anemia    Anxiety    Arthritis    Asthma    Depression    Dyspnea    Obesity    Substance abuse (HCC)     Patient Active Problem List   Diagnosis Date Noted   Uterine polyp 12/19/2021   Mild intermittent asthma without complication 09/13/2021   COVID-19 05/15/2021   Class 3 severe obesity due to excess calories without serious comorbidity with body mass index (BMI) of 45.0 to 49.9 in adult (HCC) 03/19/2021   Exposure to COVID-19 virus 09/28/2020   Acute non-recurrent frontal sinusitis 09/28/2020   CIN II (cervical intraepithelial neoplasia II) 01/02/2012    Past Surgical History:  Procedure Laterality Date   FOOT SURGERY Bilateral    TONSILLECTOMY AND ADENOIDECTOMY     WISDOM TOOTH EXTRACTION      OB History     Gravida  2   Para  2   Term      Preterm      AB      Living         SAB      IAB      Ectopic      Multiple      Live Births               Home Medications    Prior to Admission medications   Medication Sig Start Date End Date Taking? Authorizing Provider   doxycycline (VIBRAMYCIN) 100 MG capsule Take 1 capsule (100 mg total) by mouth 2 (two) times daily. 09/18/22  Yes Tomi Bamberger, PA-C  albuterol (PROVENTIL) (2.5 MG/3ML) 0.083% nebulizer solution Take 3 mLs (2.5 mg total) by nebulization every 6 (six) hours as needed for wheezing or shortness of breath. 08/19/22   Ellsworth Lennox, PA-C  albuterol (VENTOLIN HFA) 108 (90 Base) MCG/ACT inhaler Inhale 2 puffs into the lungs every 6 (six) hours as needed for wheezing or shortness of breath. 08/19/22   Ellsworth Lennox, PA-C  cefdinir (OMNICEF) 300 MG capsule Take 1 capsule (300 mg total) by mouth 2 (two) times daily. 06/04/22   Elson Areas, PA-C  cyclobenzaprine (FLEXERIL) 10 MG tablet Take 1 tablet (10 mg total) by mouth 2 (two) times daily as needed for muscle spasms. Do not drink alcohol or drive while taking this medication.  May cause drowsiness. 04/08/22   Particia Nearing, PA-C  dextromethorphan-guaiFENesin [facility] DM) 30-600 MG 12hr tablet Take 1 tablet by mouth 2 (two) times daily. 08/19/22  Nyoka Lint, PA-C  EPINEPHrine (EPIPEN 2-PAK) 0.3 mg/0.3 mL IJ SOAJ injection Inject 0.3 mg into the muscle as needed for anaphylaxis. 02/06/22   Vevelyn Francois, NP  ferrous gluconate (FERGON) 324 MG tablet Take 1 tablet (324 mg total) by mouth daily with breakfast. 01/07/22 01/07/23  Vevelyn Francois, NP  fluticasone (FLONASE) 50 MCG/ACT nasal spray Place 2 sprays into both nostrils daily. 10/23/20   Vevelyn Francois, NP  fluticasone (FLONASE) 50 MCG/ACT nasal spray Place 2 sprays into both nostrils daily. 08/19/22   Nyoka Lint, PA-C  hydrOXYzine (VISTARIL) 25 MG capsule Take 1 capsule (25 mg total) by mouth every 8 (eight) hours as needed. 02/06/22   Vevelyn Francois, NP  ibuprofen (ADVIL,MOTRIN) 200 MG tablet Take 400 mg by mouth every 6 (six) hours as needed for headache or mild pain.    [provider]  levocetirizine (XYZAL) 5 MG tablet Take 1 tablet (5 mg total) by mouth every evening. 03/21/21  03/21/22  Vevelyn Francois, NP  montelukast (SINGULAIR) 10 MG tablet Take 1 tablet (10 mg total) by mouth at bedtime. 10/08/21 10/08/22  Vevelyn Francois, NP  predniSONE (DELTASONE) 10 MG tablet Take 1 tablet (10 mg total) by mouth in the morning, at noon, and at bedtime. 08/19/22   Nyoka Lint, PA-C  triamcinolone cream (KENALOG) 0.1 % Apply 1 application topically 2 (two) times daily. Apply for 2 weeks. May use on face Patient taking differently: Apply 1 application. topically 2 (two) times daily as needed. Apply for 2 weeks. May use on face 06/21/21   Vevelyn Francois, NP    Family History Family History  Problem Relation Age of Onset   Cancer Mother    HIV Mother    Diabetes Maternal Grandmother    Diabetes Paternal Grandmother    Heart failure Father     Social History Social History   Tobacco Use   Smoking status: Former    Types: Cigarettes   Smokeless tobacco: Never  Vaping Use   Vaping Use: Never used  Substance Use Topics   Alcohol use: No   Drug use: No     Allergies   Codeine, Amoxicillin, Peanut allergen powder-dnfp, Peanut-containing drug products, and Shrimp [shellfish allergy]   Review of Systems Review of Systems  Constitutional:  Negative for chills and fever.  HENT:  Positive for ear pain. Negative for sore throat.   Eyes:  Negative for discharge and redness.  Respiratory:  Positive for cough. Negative for shortness of breath.   Gastrointestinal:  Negative for abdominal pain, nausea and vomiting.  Neurological:  Positive for headaches.     Physical Exam Triage Vital Signs ED Triage Vitals  Enc Vitals Group     BP 09/18/22 1811 132/75     Pulse Rate 09/18/22 1810 80     Resp 09/18/22 1810 18     Temp 09/18/22 1810 98.1 F (36.7 C)     Temp Source 09/18/22 1810 Oral     SpO2 09/18/22 1810 98 %     Weight --      Height --      Head Circumference --      Peak Flow --      Pain Score 09/18/22 1809 8     Pain Loc --      Pain Edu? --      Excl.  in [place]? --    No data found.  Updated Vital Signs BP 132/75   Pulse 80  Temp 98.1 F (36.7 C) (Oral)   Resp 18   LMP 09/14/2022   SpO2 98%      Physical Exam Vitals and nursing note reviewed.  Constitutional:      General: She is not in acute distress.    Appearance: Normal appearance. She is not ill-appearing.  HENT:     Head: Normocephalic and atraumatic.     Ears:     Comments: Left TM erythematous, right TM WNL    Nose: Congestion (mild) present.     Mouth/Throat:     Mouth: Mucous membranes are moist.     Pharynx: Oropharynx is clear. No oropharyngeal exudate or posterior oropharyngeal erythema.  Eyes:     Conjunctiva/sclera: Conjunctivae normal.  Cardiovascular:     Rate and Rhythm: Normal rate and regular rhythm.  Pulmonary:     Effort: Pulmonary effort is normal. No respiratory distress.     Breath sounds: Normal breath sounds. No wheezing, rhonchi or rales.  Neurological:     Mental Status: She is alert.  Psychiatric:        Mood and Affect: Mood normal.        Behavior: Behavior normal.        Thought Content: Thought content normal.      UC Treatments / Results  Labs (all labs ordered are listed, but only abnormal results are displayed) Labs Reviewed  RESP PANEL BY RT-PCR (FLU A&B, COVID) [id number]    EKG   Radiology No results found.  Procedures Procedures (including critical care time)  Medications Ordered in UC Medications - No data to display  Initial Impression / Assessment and Plan / UC Course  I have reviewed the triage vital signs and the nursing notes.  Pertinent labs & imaging results that were available during my care of the patient were reviewed by me and considered in my medical decision making (see chart for details).    We will treat to cover otitis media with doxycycline.  Screening ordered for COVID and flu.  Recommended symptomatic treatment otherwise and follow-up with any further concerns.  Final Clinical  Impressions(s) / UC Diagnoses   Final diagnoses:  Other acute nonsuppurative otitis media of left ear, recurrence not specified  Viral syndrome  Encounter for screening for COVID-19   Discharge Instructions   None    ED Prescriptions     Medication Sig Dispense Auth. Provider   doxycycline (VIBRAMYCIN) 100 MG capsule Take 1 capsule (100 mg total) by mouth 2 (two) times daily. 20 capsule Tomi Bamberger, PA-C      PDMP not reviewed this encounter.   Tomi Bamberger, PA-C 09/19/22 [phone]

## 2022-09-19 LAB — RESP PANEL BY RT-PCR (FLU A&B, COVID) ARPGX2
Influenza A by PCR: NEGATIVE
Influenza B by PCR: NEGATIVE
SARS Coronavirus 2 by RT PCR: NEGATIVE

## 2022-10-25 ENCOUNTER — Other Ambulatory Visit: Payer: Self-pay | Admitting: Nurse Practitioner

## 2022-10-26 ENCOUNTER — Ambulatory Visit (HOSPITAL_COMMUNITY)
Admission: EM | Admit: 2022-10-26 | Discharge: 2022-10-26 | Disposition: A | Discharge status: Home / Self Care | Payer: Medicaid Other | Attending: Emergency Medicine | Admitting: Emergency Medicine

## 2022-10-26 ENCOUNTER — Encounter (HOSPITAL_COMMUNITY): Payer: Self-pay

## 2022-10-26 ENCOUNTER — Ambulatory Visit (HOSPITAL_COMMUNITY): Payer: Medicaid Other

## 2022-10-26 DIAGNOSIS — B349 Viral infection, unspecified: Secondary | ICD-10-CM | POA: Diagnosis not present

## 2022-10-26 DIAGNOSIS — U071 COVID-19: Secondary | ICD-10-CM | POA: Diagnosis not present

## 2022-10-26 LAB — POCT RAPID STREP A, ED / UC: Streptococcus, Group A Screen (Direct): NEGATIVE

## 2022-10-26 LAB — POC INFLUENZA A AND B ANTIGEN (URGENT CARE ONLY)
INFLUENZA A ANTIGEN, POC: NEGATIVE
INFLUENZA B ANTIGEN, POC: NEGATIVE

## 2022-10-26 MED ORDER — CEFDINIR 300 MG PO CAPS: 300.0000 mg | ORAL_CAPSULE | Freq: Two times a day (BID) | ORAL | 0 refills | Status: DC

## 2022-10-26 NOTE — ED Triage Notes (Signed)
Chief Complaint: Headache cough cold chills but been running a fever ear pain congestion nasal drip fatigue. Cough is productive with a dark grey color to it. Last fever was 102.3 when taken at home.   Onset: 2 days  Prescriptions or OTC medications tried: Yes- Mucinex    with mild relief  Sick exposure: yes; grandchild had strep, in laws had RSV last week.   New foods, medications, or products: No  Recent Travel: No

## 2022-10-26 NOTE — Discharge Instructions (Signed)
Your symptoms today are most likely being caused by a virus and should steadily improve in time it can take up to 7 to 10 days before you truly start to see a turnaround however things will get better  Flu and strep testing negative  COVID testing pending up to 24 hours, you will be notified of positive test results only, if positive you will receive antiviral medicine which helps to reduce the amount of virus within your body which reduces your symptoms and timeline that you are sick, does not fully take illness away, medicine will be sent in at time of notification, if positive you will need to quarantine for an additional 3 days, may return to activities on Tuesday wearing mask  If your symptoms have not shown any signs of improvement by November 02, 2022, you may go to the pharmacy and pick up cefdinir which is an antibiotic and use to cover for bacteria which may be causing your symptoms to prolong    You can take Tylenol and/or Ibuprofen as needed for fever reduction and pain relief.   For cough: honey 1/2 to 1 teaspoon (you can dilute the honey in water or another fluid).  You can also use guaifenesin and dextromethorphan for cough. You can use a humidifier for chest congestion and cough.  If you don't have a humidifier, you can sit in the bathroom with the hot shower running.      For sore throat: try warm salt water gargles, cepacol lozenges, throat spray, warm tea or water with lemon/honey, popsicles or ice, or OTC cold relief medicine for throat discomfort.   For congestion: take a daily anti-histamine like Zyrtec, Claritin, and a oral decongestant, such as pseudoephedrine.  You can also use Flonase 1-2 sprays in each nostril daily.   It is important to stay hydrated: drink plenty of fluids (water, gatorade/powerade/pedialyte, juices, or teas) to keep your throat moisturized and help further relieve irritation/discomfort.

## 2022-10-26 NOTE — ED Provider Notes (Signed)
[facility]    CSN: [id number] Arrival date & time: 10/26/22  1107      History   Chief Complaint Chief Complaint  Patient presents with   Generalized Body Aches    Headache cough cold chills but been running a fever ear pain congestion nasal drip fatigue - Entered by patient    HPI Melissa Guerra is a 41 y.o. female.   Patient presents with fever, chills, nasal congestion, right-sided ear pain, postnasal drip, rhinorrhea, sore throat, nonproductive cough and shortness of breath at rest for 2 days, associated mild wheeze heard 1-2 times.  Fever peaking at 102.3. possible sick contacts at work, known exposure to RSV and strep over the last 2 weeks.  Decreased appetite but tolerating fluids.  Has attempted use of Mucinex and sinus cold and flu which have been minimally effective.    Past Medical History:  Diagnosis Date   Abnormal Pap smear    Acid reflux    Anemia    Anxiety    Arthritis    Asthma    Depression    Dyspnea    Obesity    Substance abuse (HCC)     Patient Active Problem List   Diagnosis Date Noted   Uterine polyp 12/19/2021   Mild intermittent asthma without complication 09/13/2021   COVID-19 05/15/2021   Class 3 severe obesity due to excess calories without serious comorbidity with body mass index (BMI) of 45.0 to 49.9 in adult [facility]) 03/19/2021   Exposure to COVID-19 virus 09/28/2020   Acute non-recurrent frontal sinusitis 09/28/2020   CIN II (cervical intraepithelial neoplasia II) 01/02/2012    Past Surgical History:  Procedure Laterality Date   FOOT SURGERY Bilateral    TONSILLECTOMY AND ADENOIDECTOMY     WISDOM TOOTH EXTRACTION      OB History     Gravida  2   Para  2   Term      Preterm      AB      Living         SAB      IAB      Ectopic      Multiple      Live Births               Home Medications    Prior to Admission medications   Medication Sig Start Date End Date Taking? Authorizing Provider   albuterol (PROVENTIL) (2.5 MG/3ML) 0.083% nebulizer solution Take 3 mLs (2.5 mg total) by nebulization every 6 (six) hours as needed for wheezing or shortness of breath. 08/19/22  Yes Ellsworth Lennox, PA-C  albuterol (VENTOLIN HFA) 108 (90 Base) MCG/ACT inhaler Inhale 2 puffs into the lungs every 6 (six) hours as needed for wheezing or shortness of breath. 08/19/22  Yes Ellsworth Lennox, PA-C  cefdinir (OMNICEF) 300 MG capsule Take 1 capsule (300 mg total) by mouth 2 (two) times daily. 06/04/22  Yes Cheron Schaumann K, PA-C  cyclobenzaprine (FLEXERIL) 10 MG tablet Take 1 tablet (10 mg total) by mouth 2 (two) times daily as needed for muscle spasms. Do not drink alcohol or drive while taking this medication.  May cause drowsiness. 04/08/22  Yes Particia Nearing, PA-C  dextromethorphan-guaiFENesin [facility] DM) 30-600 MG 12hr tablet Take 1 tablet by mouth 2 (two) times daily. 08/19/22  Yes Ellsworth Lennox, PA-C  doxycycline (VIBRAMYCIN) 100 MG capsule Take 1 capsule (100 mg total) by mouth 2 (two) times daily. 09/18/22  Yes Tomi Bamberger, PA-C  EPINEPHrine (EPIPEN 2-PAK) 0.3 mg/0.3 mL IJ SOAJ injection Inject 0.3 mg into the muscle as needed for anaphylaxis. 02/06/22  Yes Barbette Merino, NP  ferrous gluconate (FERGON) 324 MG tablet Take 1 tablet (324 mg total) by mouth daily with breakfast. 01/07/22 01/07/23 Yes King, Shana Chute, NP  fluticasone (FLONASE) 50 MCG/ACT nasal spray Place 2 sprays into both nostrils daily. 10/23/20  Yes King, Shana Chute, NP  fluticasone (FLONASE) 50 MCG/ACT nasal spray Place 2 sprays into both nostrils daily. 08/19/22  Yes Ellsworth Lennox, PA-C  hydrOXYzine (VISTARIL) 25 MG capsule Take 1 capsule (25 mg total) by mouth every 8 (eight) hours as needed. 02/06/22  Yes Barbette Merino, NP  ibuprofen (ADVIL,MOTRIN) 200 MG tablet Take 400 mg by mouth every 6 (six) hours as needed for headache or mild pain.   Yes [provider]  predniSONE (DELTASONE) 10 MG tablet Take 1 tablet (10 mg total)  by mouth in the morning, at noon, and at bedtime. 08/19/22  Yes Ellsworth Lennox, PA-C  triamcinolone cream (KENALOG) 0.1 % Apply 1 application topically 2 (two) times daily. Apply for 2 weeks. May use on face Patient taking differently: Apply 1 application  topically 2 (two) times daily as needed. Apply for 2 weeks. May use on face 06/21/21  Yes Barbette Merino, NP  levocetirizine (XYZAL) 5 MG tablet Take 1 tablet (5 mg total) by mouth every evening. 03/21/21 03/21/22  Barbette Merino, NP  montelukast (SINGULAIR) 10 MG tablet Take 1 tablet (10 mg total) by mouth at bedtime. 10/08/21 10/08/22  Barbette Merino, NP    Family History Family History  Problem Relation Age of Onset   Cancer Mother    HIV Mother    Diabetes Maternal Grandmother    Diabetes Paternal Grandmother    Heart failure Father     Social History Social History   Tobacco Use   Smoking status: Former    Types: Cigarettes   Smokeless tobacco: Never  Vaping Use   Vaping Use: Never used  Substance Use Topics   Alcohol use: No   Drug use: No     Allergies   Codeine, Amoxicillin, Peanut allergen powder-dnfp, Peanut-containing drug products, and Shrimp [shellfish allergy]   Review of Systems Review of Systems  Constitutional:  Positive for chills and fever. Negative for activity change, appetite change, diaphoresis, fatigue and unexpected weight change.  HENT:  Positive for congestion, ear pain, postnasal drip, rhinorrhea and sore throat. Negative for dental problem, drooling, ear discharge, facial swelling, hearing loss, mouth sores, nosebleeds, sinus pressure, sinus pain, sneezing, tinnitus, trouble swallowing and voice change.   Respiratory:  Positive for cough and shortness of breath. Negative for apnea, choking, chest tightness, wheezing and stridor.   Cardiovascular: Negative.   Gastrointestinal: Negative.   Skin: Negative.      Physical Exam Triage Vital Signs ED Triage Vitals  Enc Vitals Group     BP 10/26/22  1313 (!) 143/73     Pulse Rate 10/26/22 1313 85     Resp 10/26/22 1313 16     Temp 10/26/22 1313 (!) 100.5 F (38.1 C)     Temp Source 10/26/22 1313 Oral     SpO2 10/26/22 1313 100 %     Weight --      Height --      Head Circumference --      Peak Flow --      Pain Score 10/26/22 1311 8     Pain Loc --  Pain Edu? --      Excl. in [place]? --    No data found.  Updated Vital Signs BP (!) 143/73 (BP Location: Left Arm)   Pulse 85   Temp (!) 100.5 F (38.1 C) (Oral)   Resp 16   LMP 10/14/2022 (Approximate)   SpO2 100%   Visual Acuity Right Eye Distance:   Left Eye Distance:   Bilateral Distance:    Right Eye Near:   Left Eye Near:    Bilateral Near:     Physical Exam Constitutional:      Appearance: Normal appearance.  HENT:     Head: Normocephalic.     Right Ear: Tympanic membrane, ear canal and external ear normal.     Left Ear: Tympanic membrane, ear canal and external ear normal.     Nose: Congestion and rhinorrhea present.  Eyes:     Extraocular Movements: Extraocular movements intact.  Cardiovascular:     Rate and Rhythm: Normal rate and regular rhythm.     Pulses: Normal pulses.     Heart sounds: Normal heart sounds.  Pulmonary:     Effort: Pulmonary effort is normal.     Breath sounds: Normal breath sounds.  Skin:    General: Skin is warm and dry.  Neurological:     Mental Status: She is alert and oriented to person, place, and time. Mental status is at baseline.  Psychiatric:        Mood and Affect: Mood normal.        Behavior: Behavior normal.      UC Treatments / Results  Labs (all labs ordered are listed, but only abnormal results are displayed) Labs Reviewed  POCT RAPID STREP A, ED / UC    EKG   Radiology No results found.  Procedures Procedures (including critical care time)  Medications Ordered in UC Medications - No data to display  Initial Impression / Assessment and Plan / UC Course  I have reviewed the triage vital  signs and the nursing notes.  Pertinent labs & imaging results that were available during my care of the patient were reviewed by me and considered in my medical decision making (see chart for details).  Viral illness  Patient is in no signs of distress nor toxic appearing.  Vital signs are stable.  Low suspicion for pneumonia, pneumothorax or bronchitis and therefore will defer imaging.  Flu and strep testing negative, discussed with patient.  COVID test is pending, reviewed quarantine guidelines per CDC recommendations, will qualify for antivirals if positive due to history of asthma, discussed administration  Watchful wait antibiotic placed at pharmacy for day 10 of illness, cefdinir prescribed    may use additional over-the-counter medications as needed for supportive care.  May follow-up with urgent care as needed if symptoms persist or worsen.  Note given.   Final Clinical Impressions(s) / UC Diagnoses   Final diagnoses:  None   Discharge Instructions   None    ED Prescriptions   None    PDMP not reviewed this encounter.   Valinda Hoar, NP 10/26/22 1358

## 2022-10-27 LAB — SARS CORONAVIRUS 2 (TAT 6-24 HRS): SARS Coronavirus 2: POSITIVE — AB

## 2022-10-28 ENCOUNTER — Telehealth (HOSPITAL_COMMUNITY): Payer: Self-pay | Admitting: Emergency Medicine

## 2022-10-28 MED ORDER — NIRMATRELVIR/RITONAVIR (PAXLOVID)TABLET: 3.0000 | ORAL_TABLET | Freq: Two times a day (BID) | ORAL | 0 refills | Status: AC

## 2022-12-14 ENCOUNTER — Other Ambulatory Visit: Payer: Self-pay

## 2022-12-14 ENCOUNTER — Ambulatory Visit
Admission: RE | Admit: 2022-12-14 | Discharge: 2022-12-14 | Disposition: A | Discharge status: Home / Self Care | Payer: Medicaid Other | Source: Ambulatory Visit | Attending: Internal Medicine | Admitting: Internal Medicine

## 2022-12-14 VITALS — BP 135/84 | HR 88 | Temp 98.6°F | Resp 20

## 2022-12-14 DIAGNOSIS — B351 Tinea unguium: Secondary | ICD-10-CM

## 2022-12-14 MED ORDER — CICLOPIROX 8 % EX SOLN: Freq: Every day | CUTANEOUS | 0 refills | Status: DC

## 2022-12-14 NOTE — Discharge Instructions (Signed)
You have toenail fungus.  Follow-up with podiatry.  I have prescribed a topical medication that may be helpful.

## 2022-12-14 NOTE — ED Provider Notes (Signed)
[name]    CSN: [id number] Arrival date & time: 12/14/22  1255      History   Chief Complaint Chief Complaint  Patient presents with   Toe Pain   Appointment    HPI Melissa Guerra is a 42 y.o. female.   Patient presents with yellow discoloration and draining from the left great toenail that started a few days prior.  Denies any injury to the area.  Denies any associated fever.  Denies history of the same.  She does report history of ingrown toenails but reports this feels different.   Toe Pain    Past Medical History:  Diagnosis Date   Abnormal Pap smear    Acid reflux    Anemia    Anxiety    Arthritis    Asthma    Depression    Dyspnea    Obesity    Substance abuse ([place])     Patient Active Problem List   Diagnosis Date Noted   Uterine polyp 12/19/2021   Mild intermittent asthma without complication 29/79/8921   COVID-19 05/15/2021   Class 3 severe obesity due to excess calories without serious comorbidity with body mass index (BMI) of 45.0 to 49.9 in adult [facility]) 03/19/2021   Exposure to COVID-19 virus 09/28/2020   Acute non-recurrent frontal sinusitis 09/28/2020   CIN II (cervical intraepithelial neoplasia II) 01/02/2012    Past Surgical History:  Procedure Laterality Date   FOOT SURGERY Bilateral    TONSILLECTOMY AND ADENOIDECTOMY     WISDOM TOOTH EXTRACTION      OB History     Gravida  2   Para  2   Term      Preterm      AB      Living         SAB      IAB      Ectopic      Multiple      Live Births               Home Medications    Prior to Admission medications   Medication Sig Start Date End Date Taking? Authorizing Provider  ciclopirox (PENLAC) 8 % solution Apply topically at bedtime. Apply over nail and surrounding skin. Apply daily over previous coat. After seven (7) days, may remove with alcohol and continue cycle. 12/14/22  Yes Tomas Schamp, Hildred Alamin E, FNP  albuterol (PROVENTIL) (2.5 MG/3ML) 0.083%  nebulizer solution Take 3 mLs (2.5 mg total) by nebulization every 6 (six) hours as needed for wheezing or shortness of breath. 08/19/22   Nyoka Lint, PA-C  albuterol (VENTOLIN HFA) 108 (90 Base) MCG/ACT inhaler Inhale 2 puffs into the lungs every 6 (six) hours as needed for wheezing or shortness of breath. 08/19/22   Nyoka Lint, PA-C  cefdinir (OMNICEF) 300 MG capsule Take 1 capsule (300 mg total) by mouth 2 (two) times daily. 11/02/22   White, Leitha Schuller, NP  cyclobenzaprine (FLEXERIL) 10 MG tablet Take 1 tablet (10 mg total) by mouth 2 (two) times daily as needed for muscle spasms. Do not drink alcohol or drive while taking this medication.  May cause drowsiness. 04/08/22   Volney American, PA-C  dextromethorphan-guaiFENesin [facility] DM) 30-600 MG 12hr tablet Take 1 tablet by mouth 2 (two) times daily. 08/19/22   Nyoka Lint, PA-C  doxycycline (VIBRAMYCIN) 100 MG capsule Take 1 capsule (100 mg total) by mouth 2 (two) times daily. 09/18/22   Francene Finders, PA-C  EPINEPHrine [facility]  2-PAK) 0.3 mg/0.3 mL IJ SOAJ injection Inject 0.3 mg into the muscle as needed for anaphylaxis. 02/06/22   Vevelyn Francois, NP  ferrous gluconate (FERGON) 324 MG tablet Take 1 tablet (324 mg total) by mouth daily with breakfast. 01/07/22 01/07/23  Vevelyn Francois, NP  fluticasone (FLONASE) 50 MCG/ACT nasal spray Place 2 sprays into both nostrils daily. 10/23/20   Vevelyn Francois, NP  fluticasone (FLONASE) 50 MCG/ACT nasal spray Place 2 sprays into both nostrils daily. 08/19/22   Nyoka Lint, PA-C  hydrOXYzine (VISTARIL) 25 MG capsule Take 1 capsule (25 mg total) by mouth every 8 (eight) hours as needed. 02/06/22   Vevelyn Francois, NP  ibuprofen (ADVIL,MOTRIN) 200 MG tablet Take 400 mg by mouth every 6 (six) hours as needed for headache or mild pain.    [provider]  levocetirizine (XYZAL) 5 MG tablet TAKE ONE TABLET BY MOUTH EVERY EVENING 11/04/22   Fenton Foy, NP  montelukast (SINGULAIR) 10 MG tablet  Take 1 tablet (10 mg total) by mouth at bedtime. 10/08/21 10/08/22  Vevelyn Francois, NP  predniSONE (DELTASONE) 10 MG tablet Take 1 tablet (10 mg total) by mouth in the morning, at noon, and at bedtime. 08/19/22   Nyoka Lint, PA-C  triamcinolone cream (KENALOG) 0.1 % Apply 1 application topically 2 (two) times daily. Apply for 2 weeks. May use on face Patient taking differently: Apply 1 application  topically 2 (two) times daily as needed. Apply for 2 weeks. May use on face 06/21/21   Vevelyn Francois, NP    Family History Family History  Problem Relation Age of Onset   Cancer Mother    HIV Mother    Diabetes Maternal Grandmother    Diabetes Paternal Grandmother    Heart failure Father     Social History Social History   Tobacco Use   Smoking status: Former    Types: Cigarettes   Smokeless tobacco: Never  Vaping Use   Vaping Use: Never used  Substance Use Topics   Alcohol use: No   Drug use: No     Allergies   Codeine, Amoxicillin, Peanut allergen powder-dnfp, Peanut-containing drug products, and Shrimp [shellfish allergy]   Review of Systems Review of Systems Per HPI  Physical Exam Triage Vital Signs ED Triage Vitals  Enc Vitals Group     BP 12/14/22 1327 135/84     Pulse Rate 12/14/22 1327 88     Resp 12/14/22 1327 20     Temp 12/14/22 1328 98.6 F (37 C)     Temp Source 12/14/22 1327 Oral     SpO2 12/14/22 1327 98 %     Weight --      Height --      Head Circumference --      Peak Flow --      Pain Score 12/14/22 1326 5     Pain Loc --      Pain Edu? --      Excl. in [place]? --    No data found.  Updated Vital Signs BP 135/84 (BP Location: Left Arm)   Pulse 88   Temp 98.6 F (37 C)   Resp 20   LMP 12/09/2022 (Exact Date)   SpO2 98%   Visual Acuity Right Eye Distance:   Left Eye Distance:   Bilateral Distance:    Right Eye Near:   Left Eye Near:    Bilateral Near:     Physical Exam Constitutional:  General: She is not in acute  distress.    Appearance: Normal appearance. She is not toxic-appearing or diaphoretic.  HENT:     Head: Normocephalic and atraumatic.  Eyes:     Extraocular Movements: Extraocular movements intact.     Conjunctiva/sclera: Conjunctivae normal.  Pulmonary:     Effort: Pulmonary effort is normal.  Feet:     Comments: Patient has yellow discoloration throughout left great toenail.  There is also some yellow discoloration throughout partial toenail of the left second digit.  Patient does have purulent discoloration present underneath left great toenail.  No obvious swelling or erythema.  Capillary refill and pulses are intact.  Patient has full range of motion of toe. Neurological:     General: No focal deficit present.     Mental Status: She is alert and oriented to person, place, and time. Mental status is at baseline.  Psychiatric:        Mood and Affect: Mood normal.        Behavior: Behavior normal.        Thought Content: Thought content normal.        Judgment: Judgment normal.      UC Treatments / Results  Labs (all labs ordered are listed, but only abnormal results are displayed) Labs Reviewed - No data to display  EKG   Radiology No results found.  Procedures Procedures (including critical care time)  Medications Ordered in UC Medications - No data to display  Initial Impression / Assessment and Plan / UC Course  I have reviewed the triage vital signs and the nursing notes.  Pertinent labs & imaging results that were available during my care of the patient were reviewed by me and considered in my medical decision making (see chart for details).     Physical exam is consistent with toenail fungus.  No signs of bacterial infection or ingrown toenail on exam.  Advised patient of supportive care including wearing white socks and keeping toes moisture free.  Will prescribe Penlac topically but patient was advised that this would most likely not be very beneficial given  significance of toenail fungus.  Patient advised to follow-up with podiatry at provided contact information on Monday to schedule appointment for further evaluation and management.  Patient verbalized understanding and was agreeable with plan. Final Clinical Impressions(s) / UC Diagnoses   Final diagnoses:  Toenail fungus     Discharge Instructions      You have toenail fungus.  Follow-up with podiatry.  I have prescribed a topical medication that may be helpful.    ED Prescriptions     Medication Sig Dispense Auth. Provider   ciclopirox (PENLAC) 8 % solution Apply topically at bedtime. Apply over nail and surrounding skin. Apply daily over previous coat. After seven (7) days, may remove with alcohol and continue cycle. 6.6 mL Gustavus Bryant, [place]      PDMP not reviewed this encounter.   Gustavus Bryant, [place] 12/14/22 1504

## 2022-12-14 NOTE — ED Triage Notes (Signed)
Pt  reports leaking from under her toe nail & states there is color change under nail. States she cleaned it with hydrogen peroxide.

## 2023-02-26 ENCOUNTER — Ambulatory Visit
Admission: RE | Admit: 2023-02-26 | Discharge: 2023-02-26 | Disposition: A | Discharge status: Home / Self Care | Payer: Medicaid Other | Source: Ambulatory Visit | Attending: Nurse Practitioner | Admitting: Nurse Practitioner

## 2023-02-26 VITALS — BP 144/73 | HR 79 | Temp 98.8°F | Resp 18

## 2023-02-26 DIAGNOSIS — N898 Other specified noninflammatory disorders of vagina: Secondary | ICD-10-CM | POA: Diagnosis not present

## 2023-02-26 MED ORDER — FLUCONAZOLE 150 MG PO TABS: 150.0000 mg | ORAL_TABLET | Freq: Every day | ORAL | 0 refills | Status: AC

## 2023-02-26 NOTE — Discharge Instructions (Signed)
Take Diflucan as prescribed The clinic will contact you with results of the vaginal swab done today if it is positive Please follow-up with your PCP if your symptoms do not improve Please go to the ER for any worsening symptoms

## 2023-02-26 NOTE — ED Provider Notes (Signed)
[facility]    CSN: [id number] Arrival date & time: 02/26/23  1658      History   Chief Complaint Chief Complaint  Patient presents with   Vaginal Discharge    Started my cycle March 25 Which I Started spotting 23 cycle gone but I still have a discharge an it's got blood in it an a little irritating - Entered by patient    HPI Melissa Guerra is a 42 y.o. female presents for evaluation of vaginal discharge.  Patient states she started her menstrual cycle on 3/25 and it has been longer than typical.  It is lighter now but she does have some bloody discharge with a little irritation.  She denies any dysuria STD exposure or concern but would like screening while she is here.  She is not on birth control use an IUD.  Denies pregnancy risk.  She does have a lot of stress recently as the anniversary of her father's death is upcoming soon.  She does have a history of yeast infections and thinks it may be similar.  She is also been on a lot of antibiotics secondary to oral surgery.  No OTC medications have been used.  No other concerns at this time   Vaginal Discharge   Past Medical History:  Diagnosis Date   Abnormal Pap smear    Acid reflux    Anemia    Anxiety    Arthritis    Asthma    Depression    Dyspnea    Obesity    Substance abuse     Patient Active Problem List   Diagnosis Date Noted   Uterine polyp 12/19/2021   Mild intermittent asthma without complication 09/13/2021   COVID-19 05/15/2021   Class 3 severe obesity due to excess calories without serious comorbidity with body mass index (BMI) of 45.0 to 49.9 in adult 03/19/2021   Exposure to COVID-19 virus 09/28/2020   Acute non-recurrent frontal sinusitis 09/28/2020   CIN II (cervical intraepithelial neoplasia II) 01/02/2012    Past Surgical History:  Procedure Laterality Date   FOOT SURGERY Bilateral    TONSILLECTOMY AND ADENOIDECTOMY     WISDOM TOOTH EXTRACTION      OB History     Gravida  2    Para  2   Term      Preterm      AB      Living         SAB      IAB      Ectopic      Multiple      Live Births               Home Medications    Prior to Admission medications   Medication Sig Start Date End Date Taking? Authorizing Provider  cefdinir (OMNICEF) 300 MG capsule Take 1 capsule (300 mg total) by mouth 2 (two) times daily. 11/02/22  Yes White, Adrienne R, NP  fluconazole (DIFLUCAN) 150 MG tablet Take 1 tablet (150 mg total) by mouth daily. 02/26/23  Yes Radford Pax, NP  levocetirizine (XYZAL) 5 MG tablet TAKE ONE TABLET BY MOUTH EVERY EVENING 11/04/22  Yes Ivonne Andrew, NP  albuterol (PROVENTIL) (2.5 MG/3ML) 0.083% nebulizer solution Take 3 mLs (2.5 mg total) by nebulization every 6 (six) hours as needed for wheezing or shortness of breath. 08/19/22   Ellsworth Lennox, PA-C  albuterol (VENTOLIN HFA) 108 (90 Base) MCG/ACT inhaler Inhale 2 puffs into the lungs  every 6 (six) hours as needed for wheezing or shortness of breath. 08/19/22   Ellsworth Lennox, PA-C  ciclopirox [facility]) 8 % solution Apply topically at bedtime. Apply over nail and surrounding skin. Apply daily over previous coat. After seven (7) days, may remove with alcohol and continue cycle. 12/14/22   Gustavus Bryant, FNP  cyclobenzaprine (FLEXERIL) 10 MG tablet Take 1 tablet (10 mg total) by mouth 2 (two) times daily as needed for muscle spasms. Do not drink alcohol or drive while taking this medication.  May cause drowsiness. 04/08/22   Particia Nearing, PA-C  dextromethorphan-guaiFENesin [facility] DM) 30-600 MG 12hr tablet Take 1 tablet by mouth 2 (two) times daily. 08/19/22   Ellsworth Lennox, PA-C  doxycycline (VIBRAMYCIN) 100 MG capsule Take 1 capsule (100 mg total) by mouth 2 (two) times daily. 09/18/22   Tomi Bamberger, PA-C  EPINEPHrine (EPIPEN 2-PAK) 0.3 mg/0.3 mL IJ SOAJ injection Inject 0.3 mg into the muscle as needed for anaphylaxis. 02/06/22   Barbette Merino, NP  ferrous gluconate (FERGON)  324 MG tablet Take 1 tablet (324 mg total) by mouth daily with breakfast. 01/07/22 01/07/23  Barbette Merino, NP  fluticasone (FLONASE) 50 MCG/ACT nasal spray Place 2 sprays into both nostrils daily. 10/23/20   Barbette Merino, NP  fluticasone (FLONASE) 50 MCG/ACT nasal spray Place 2 sprays into both nostrils daily. 08/19/22   Ellsworth Lennox, PA-C  hydrOXYzine (VISTARIL) 25 MG capsule Take 1 capsule (25 mg total) by mouth every 8 (eight) hours as needed. 02/06/22   Barbette Merino, NP  ibuprofen (ADVIL,MOTRIN) 200 MG tablet Take 400 mg by mouth every 6 (six) hours as needed for headache or mild pain.    [provider]  montelukast (SINGULAIR) 10 MG tablet Take 1 tablet (10 mg total) by mouth at bedtime. 10/08/21 10/08/22  Barbette Merino, NP  predniSONE (DELTASONE) 10 MG tablet Take 1 tablet (10 mg total) by mouth in the morning, at noon, and at bedtime. 08/19/22   Ellsworth Lennox, PA-C  triamcinolone cream (KENALOG) 0.1 % Apply 1 application topically 2 (two) times daily. Apply for 2 weeks. May use on face Patient taking differently: Apply 1 application  topically 2 (two) times daily as needed. Apply for 2 weeks. May use on face 06/21/21   Barbette Merino, NP    Family History Family History  Problem Relation Age of Onset   Cancer Mother    HIV Mother    Diabetes Maternal Grandmother    Diabetes Paternal Grandmother    Heart failure Father     Social History Social History   Tobacco Use   Smoking status: Former    Types: Cigarettes   Smokeless tobacco: Never  Vaping Use   Vaping Use: Never used  Substance Use Topics   Alcohol use: No   Drug use: No     Allergies   Codeine, Penicillins, Amoxicillin, Peanut allergen powder-dnfp, Peanut-containing drug products, and Shrimp [shellfish allergy]   Review of Systems Review of Systems  Genitourinary:  Positive for vaginal discharge.     Physical Exam Triage Vital Signs ED Triage Vitals [02/26/23 1714]  Enc Vitals Group     BP  (!) 144/73     Pulse Rate 79     Resp 18     Temp 98.8 F (37.1 C)     Temp src      SpO2 97 %     Weight      Height  Head Circumference      Peak Flow      Pain Score      Pain Loc      Pain Edu?      Excl. in GC?    No data found.  Updated Vital Signs BP (!) 144/73 (BP Location: Right Arm)   Pulse 79   Temp 98.8 F (37.1 C)   Resp 18   LMP 02/10/2023   SpO2 97%   Visual Acuity Right Eye Distance:   Left Eye Distance:   Bilateral Distance:    Right Eye Near:   Left Eye Near:    Bilateral Near:     Physical Exam Vitals and nursing note reviewed.  Constitutional:      Appearance: Normal appearance.  HENT:     Head: Normocephalic and atraumatic.  Eyes:     Pupils: Pupils are equal, round, and reactive to light.  Cardiovascular:     Rate and Rhythm: Normal rate.  Pulmonary:     Effort: Pulmonary effort is normal.  Abdominal:     Tenderness: There is no right CVA tenderness or left CVA tenderness.  Skin:    General: Skin is warm and dry.  Neurological:     General: No focal deficit present.     Mental Status: She is alert and oriented to person, place, and time.  Psychiatric:        Mood and Affect: Mood normal.        Behavior: Behavior normal.      UC Treatments / Results  Labs (all labs ordered are listed, but only abnormal results are displayed) Labs Reviewed  CERVICOVAGINAL ANCILLARY ONLY    EKG   Radiology No results found.  Procedures Procedures (including critical care time)  Medications Ordered in UC Medications - No data to display  Initial Impression / Assessment and Plan / UC Course  I have reviewed the triage vital signs and the nursing notes.  Pertinent labs & imaging results that were available during my care of the patient were reviewed by me and considered in my medical decision making (see chart for details).     Reviewed exam and symptoms with patient.  No red flags Vaginal swab and will contact if anything  is positive.  Will start Diflucan Patient to follow-up with PCP or GYN if symptoms do not improve ER precautions reviewed and patient verbalized understanding Final Clinical Impressions(s) / UC Diagnoses   Final diagnoses:  Vaginal discharge     Discharge Instructions      Take Diflucan as prescribed The clinic will contact you with results of the vaginal swab done today if it is positive Please follow-up with your PCP if your symptoms do not improve Please go to the ER for any worsening symptoms    ED Prescriptions     Medication Sig Dispense Auth. Provider   fluconazole (DIFLUCAN) 150 MG tablet Take 1 tablet (150 mg total) by mouth daily. 1 tablet Radford PaxMayer, Jodi R, NP      PDMP not reviewed this encounter.   Radford PaxMayer, Jodi R, NP 02/26/23 1739

## 2023-02-26 NOTE — ED Triage Notes (Signed)
Pt c/o vaginal discharge since her LMP (which began 3/25). States the discharge is dark red, similar to the last days of a period, and is odorless. C/o mild irritation.

## 2023-02-27 LAB — CERVICOVAGINAL ANCILLARY ONLY
Bacterial Vaginitis (gardnerella): NEGATIVE
Candida Glabrata: NEGATIVE
Candida Vaginitis: POSITIVE — AB
Chlamydia: NEGATIVE
Comment: NEGATIVE
Comment: NEGATIVE
Comment: NEGATIVE
Comment: NEGATIVE
Comment: NEGATIVE
Comment: NORMAL
Neisseria Gonorrhea: NEGATIVE
Trichomonas: NEGATIVE

## 2023-03-23 ENCOUNTER — Other Ambulatory Visit: Payer: Self-pay

## 2023-03-23 ENCOUNTER — Encounter (HOSPITAL_COMMUNITY): Payer: Self-pay

## 2023-03-23 ENCOUNTER — Ambulatory Visit
Admission: RE | Admit: 2023-03-23 | Discharge: 2023-03-23 | Disposition: A | Discharge status: Other Acute Inpatient Hospital | Payer: Medicaid Other | Source: Ambulatory Visit

## 2023-03-23 ENCOUNTER — Emergency Department (HOSPITAL_COMMUNITY)
Admission: EM | Admit: 2023-03-23 | Discharge: 2023-03-23 | Disposition: A | Discharge status: Home / Self Care | Payer: Medicaid Other | Attending: Emergency Medicine | Admitting: Emergency Medicine

## 2023-03-23 VITALS — BP 138/81 | HR 79 | Temp 98.5°F | Resp 18 | Ht 62.0 in | Wt 287.0 lb

## 2023-03-23 DIAGNOSIS — I10 Essential (primary) hypertension: Secondary | ICD-10-CM | POA: Diagnosis not present

## 2023-03-23 DIAGNOSIS — R2243 Localized swelling, mass and lump, lower limb, bilateral: Secondary | ICD-10-CM | POA: Diagnosis not present

## 2023-03-23 DIAGNOSIS — R600 Localized edema: Secondary | ICD-10-CM | POA: Insufficient documentation

## 2023-03-23 DIAGNOSIS — Z79899 Other long term (current) drug therapy: Secondary | ICD-10-CM | POA: Insufficient documentation

## 2023-03-23 DIAGNOSIS — M7989 Other specified soft tissue disorders: Secondary | ICD-10-CM | POA: Diagnosis present

## 2023-03-23 DIAGNOSIS — R5383 Other fatigue: Secondary | ICD-10-CM | POA: Diagnosis not present

## 2023-03-23 DIAGNOSIS — M79601 Pain in right arm: Secondary | ICD-10-CM | POA: Diagnosis not present

## 2023-03-23 DIAGNOSIS — Z91010 Allergy to peanuts: Secondary | ICD-10-CM | POA: Insufficient documentation

## 2023-03-23 LAB — CBC WITH DIFFERENTIAL/PLATELET
Abs Immature Granulocytes: 0.03 10*3/uL (ref 0.00–0.07)
Basophils Absolute: 0 10*3/uL (ref 0.0–0.1)
Basophils Relative: 0 %
Eosinophils Absolute: 0 10*3/uL (ref 0.0–0.5)
Eosinophils Relative: 1 %
HCT: 30.9 % — ABNORMAL LOW (ref 36.0–46.0)
Hemoglobin: 9.2 g/dL — ABNORMAL LOW (ref 12.0–15.0)
Immature Granulocytes: 0 %
Lymphocytes Relative: 27 %
Lymphs Abs: 2.1 10*3/uL (ref 0.7–4.0)
MCH: 24.7 pg — ABNORMAL LOW (ref 26.0–34.0)
MCHC: 29.8 g/dL — ABNORMAL LOW (ref 30.0–36.0)
MCV: 83.1 fL (ref 80.0–100.0)
Monocytes Absolute: 0.6 10*3/uL (ref 0.1–1.0)
Monocytes Relative: 8 %
Neutro Abs: 5.1 10*3/uL (ref 1.7–7.7)
Neutrophils Relative %: 64 %
Platelets: 317 10*3/uL (ref 150–400)
RBC: 3.72 MIL/uL — ABNORMAL LOW (ref 3.87–5.11)
RDW: 17.9 % — ABNORMAL HIGH (ref 11.5–15.5)
WBC: 8 10*3/uL (ref 4.0–10.5)
nRBC: 0 % (ref 0.0–0.2)

## 2023-03-23 LAB — BASIC METABOLIC PANEL
Anion gap: 7 (ref 5–15)
BUN: 11 mg/dL (ref 6–20)
CO2: 24 mmol/L (ref 22–32)
Calcium: 8.5 mg/dL — ABNORMAL LOW (ref 8.9–10.3)
Chloride: 107 mmol/L (ref 98–111)
Creatinine, Ser: 0.7 mg/dL (ref 0.44–1.00)
GFR, Estimated: >60 mL/min (ref >60)
Glucose, Bld: 91 mg/dL (ref 70–99)
Potassium: 3.6 mmol/L (ref 3.5–5.1)
Sodium: 138 mmol/L (ref 135–145)

## 2023-03-23 MED ORDER — HYDROCHLOROTHIAZIDE 25 MG PO TABS: 25.0000 mg | ORAL_TABLET | Freq: Every day | ORAL | 0 refills | Status: AC

## 2023-03-23 NOTE — Discharge Instructions (Signed)
I recommend that you go to the emergency department for further evaluation and management of your lower extremity swelling and fatigue as I do think that you need a more extensive evaluation than can be provided here in urgent care.  For your arm pain, please follow-up with orthopedist at provided contact information to schedule an appointment tomorrow.

## 2023-03-23 NOTE — ED Triage Notes (Signed)
"  I am here for feeling weak, fatigued, & leg swelling along with feet". This was noticed "more starting Thursday". The swelling is with "some pain". NO fever. No injury. Occasional numbness in arm (right). No chest pain. No sob.

## 2023-03-23 NOTE — Discharge Instructions (Addendum)
Keep your legs elevated and follow-up with a family doctor in the next 2 to 3 weeks.  You have been placed on some blood pressure and fluid pill.  Also you are mildly anemic and need to follow-up with the family doctor for that

## 2023-03-23 NOTE — ED Provider Notes (Signed)
[place] EMERGENCY DEPARTMENT AT [facility] Provider Note   CSN: [id number] Arrival date & time: 03/23/23  1252     History {Add pertinent medical, surgical, social history, OB history to HPI:1} Chief Complaint  Patient presents with   Leg Swelling    Melissa Guerra is a 42 y.o. female.  Patient complains of swelling to both lower legs.  Patient has a history of standing during work all the time.   Leg Pain      Home Medications Prior to Admission medications   Medication Sig Start Date End Date Taking? Authorizing Provider  albuterol (PROVENTIL) (2.5 MG/3ML) 0.083% nebulizer solution Take 3 mLs (2.5 mg total) by nebulization every 6 (six) hours as needed for wheezing or shortness of breath. 08/19/22   Ellsworth Lennox, PA-C  albuterol (VENTOLIN HFA) 108 (90 Base) MCG/ACT inhaler Inhale 2 puffs into the lungs every 6 (six) hours as needed for wheezing or shortness of breath. 08/19/22   Ellsworth Lennox, PA-C  amoxicillin-clavulanate (AUGMENTIN) 875-125 MG tablet Take 1 tablet by mouth every 12 (twelve) hours.    [provider]  azithromycin (ZITHROMAX) 250 MG tablet     [provider]  cefdinir (OMNICEF) 300 MG capsule Take 1 capsule (300 mg total) by mouth 2 (two) times daily. 11/02/22   White, Elita Boone, NP  cetirizine (ZYRTEC) 10 MG tablet TK 1 T PO D    [provider]  chlorhexidine (PERIDEX) 0.12 % solution SMARTSIG:By Mouth 02/03/23   [provider]  Cholecalciferol 1.25 MG (50000 UT) capsule TK 1 C PO ONCE WEEKLY    [provider]  ciclopirox (PENLAC) 8 % solution Apply topically at bedtime. Apply over nail and surrounding skin. Apply daily over previous coat. After seven (7) days, may remove with alcohol and continue cycle. 12/14/22   Gustavus Bryant, FNP  clindamycin (CLEOCIN) 150 MG capsule Take 150 mg by mouth 3 (three) times daily. 01/17/23   [provider]  clindamycin (CLEOCIN) 300 MG capsule Take 300 mg by  mouth every 6 (six) hours. 02/14/23   [provider]  cyclobenzaprine (FLEXERIL) 10 MG tablet Take 1 tablet (10 mg total) by mouth 2 (two) times daily as needed for muscle spasms. Do not drink alcohol or drive while taking this medication.  May cause drowsiness. 04/08/22   Particia Nearing, PA-C  dextromethorphan-guaiFENesin [facility] DM) 30-600 MG 12hr tablet Take 1 tablet by mouth 2 (two) times daily. 08/19/22   Ellsworth Lennox, PA-C  doxycycline (VIBRAMYCIN) 100 MG capsule Take 1 capsule (100 mg total) by mouth 2 (two) times daily. 09/18/22   Tomi Bamberger, PA-C  EPINEPHrine (EPIPEN 2-PAK) 0.3 mg/0.3 mL IJ SOAJ injection Inject 0.3 mg into the muscle as needed for anaphylaxis. 02/06/22   Barbette Merino, NP  Ergocalciferol (VITAMIN D2 PO) TK 1 C PO ONCE A WEEK    [provider]  ferrous gluconate (FERGON) 324 MG tablet Take 1 tablet (324 mg total) by mouth daily with breakfast. 01/07/22 01/07/23  Barbette Merino, NP  fluconazole (DIFLUCAN) 150 MG tablet Take 1 tablet (150 mg total) by mouth daily. 02/26/23   Radford Pax, NP  fluticasone (FLONASE) 50 MCG/ACT nasal spray Place 2 sprays into both nostrils daily. 10/23/20   Barbette Merino, NP  fluticasone (FLONASE) 50 MCG/ACT nasal spray Place 2 sprays into both nostrils daily. 08/19/22   Ellsworth Lennox, PA-C  hydrOXYzine (VISTARIL) 25 MG capsule Take 1 capsule (25 mg total) by  mouth every 8 (eight) hours as needed. 02/06/22   Barbette Merino, NP  ibuprofen (ADVIL) 800 MG tablet Take 800 mg by mouth 3 (three) times daily.    [provider]  ibuprofen (ADVIL,MOTRIN) 200 MG tablet Take 400 mg by mouth every 6 (six) hours as needed for headache or mild pain.    [provider]  levocetirizine (XYZAL) 5 MG tablet TAKE ONE TABLET BY MOUTH EVERY EVENING 11/04/22   Ivonne Andrew, NP  LORazepam (ATIVAN) 2 MG tablet Take 2 mg by mouth 2 (two) times daily as needed. 12/23/22   [provider]  meloxicam (MOBIC) 7.5 MG  tablet Take 7.5 mg by mouth daily. 01/10/23   [provider]  methocarbamol (ROBAXIN) 500 MG tablet Take 500 mg by mouth 3 (three) times daily. 11/29/22   [provider]  montelukast (SINGULAIR) 10 MG tablet Take 1 tablet (10 mg total) by mouth at bedtime. 10/08/21 10/08/22  Barbette Merino, NP  OMEPRAZOLE PO Take 20 mg by mouth daily.    [provider]  penicillin v potassium (VEETID) 500 MG tablet Take 500 mg by mouth 4 (four) times daily. 02/03/23   [provider]  predniSONE (DELTASONE) 10 MG tablet Take 1 tablet (10 mg total) by mouth in the morning, at noon, and at bedtime. 08/19/22   Ellsworth Lennox, PA-C  triamcinolone cream (KENALOG) 0.1 % Apply 1 application topically 2 (two) times daily. Apply for 2 weeks. May use on face Patient taking differently: Apply 1 application  topically 2 (two) times daily as needed. Apply for 2 weeks. May use on face 06/21/21   Barbette Merino, NP      Allergies    Codeine, Penicillins, Amoxicillin, Peanut allergen powder-dnfp, Peanut-containing drug products, and Shrimp [shellfish allergy]    Review of Systems   Review of Systems  Physical Exam Updated Vital Signs BP (!) 170/94   Pulse 86   Temp 98.4 F (36.9 C) (Oral)   Resp 18   Ht 5\' 2"  (1.575 m)   Wt 130 kg   LMP 02/08/2023 (Exact Date) Comment: Irregular, and not really stopped.  SpO2 98%   BMI 52.42 kg/m  Physical Exam  ED Results / Procedures / Treatments   Labs (all labs ordered are listed, but only abnormal results are displayed) Labs Reviewed  CBC WITH DIFFERENTIAL/PLATELET - Abnormal; Notable for the following components:      Result Value   RBC 3.72 (*)    Hemoglobin 9.2 (*)    HCT 30.9 (*)    MCH 24.7 (*)    MCHC 29.8 (*)    RDW 17.9 (*)    All other components within normal limits  BASIC METABOLIC PANEL    EKG None  Radiology No results found.  Procedures Procedures  {Document cardiac monitor, telemetry assessment procedure when  appropriate:1}  Medications Ordered in ED Medications - No data to display  ED Course/ Medical Decision Making/ A&P   {   Click here for ABCD2, HEART and other calculatorsREFRESH Note before signing :1}                          Medical Decision Making Amount and/or Complexity of Data Reviewed Labs: ordered.  Risk Prescription drug management.   Patient with hypertension and dependent edema.  She is placed on HCTZ and will follow-up with her PCP.  Patient also has anemia which needs to be followed up  {Document  critical care time when appropriate:1} {Document review of labs and clinical decision tools ie heart score, Chads2Vasc2 etc:1}  {Document your independent review of radiology images, and any outside records:1} {Document your discussion with family members, caretakers, and with consultants:1} {Document social determinants of health affecting pt's care:1} {Document your decision making why or why not admission, treatments were needed:1} Final Clinical Impression(s) / ED Diagnoses Final diagnoses:  None    Rx / DC Orders ED Discharge Orders     None

## 2023-03-23 NOTE — ED Triage Notes (Addendum)
Patient has has bilateral leg and feet swelling since Monday. Pain in both feet. Has never had a clot before. Feeling more fatigued than normal.

## 2023-03-23 NOTE — ED Provider Notes (Signed)
[name]    CSN: [id number] Arrival date & time: 03/23/23  1010      History   Chief Complaint Chief Complaint  Patient presents with   Leg Swelling    Feet  and leg  pain & swelling occasional feeling on numbness in arms mid shoulder back area pain - Entered by patient    HPI Melissa Guerra is a 42 y.o. female.   Patient presents for a several different chief complaints today.  Patient reports that she has had some weakness, fatigue, bilateral lower extremity swelling since about 4 days ago.  Patient reports her legs are also painful.  Reports that swelling is present from the feet all the way up to her thighs.  She reports this has never happened before and denies any chest pain, shortness of breath, blurred vision, dizziness, nausea, vomiting.  She does report some headaches that started a few days ago as well though.  Denies urinary frequency, urinary burning, vaginal discharge, fever.,  Patient reports that she has had some abnormal vaginal bleeding for approximately over a month.  Patient reports that her menstrual cycle started on 3/25 and was longer than normal.  Then, she continued to have vaginal spotting until another menstrual cycle at the end of B[place]in.  She was evaluated at a different urgent care for this and was told that she had a yeast infection so was treated with fluconazole.  Patient is not sure if this is related to her current symptoms.  She does not use any form of birth control and reports that she has not in approximately 20 years.  Patient reports that she has been very fatigued recently as well and has been sleeping a lot ever since leg swelling started. Patient denies any pertinent medical history other than asthma.  Patient also reporting some right arm pain that has been present since a fall in January.  Patient reports that "it felt like a rug slipped out from under her" and she fell backwards landing on her arm.  She reports that imaging of her arm  was not completed but that she was having mainly back pain at the time.  She states that she has been evaluated and had an MRI of her back.  Arm pain has been intermittent but is flared up over the past few days.  She states that she thinks it is due to repetitive movements that she does at work recently.  She does not report any medication for arm pain. Pain is present throughout the entire arm.      Past Medical History:  Diagnosis Date   Abnormal Pap smear    Acid reflux    Anemia    Anxiety    Arthritis    Asthma    Depression    Dyspnea    Obesity    Substance abuse (HCC)     Patient Active Problem List   Diagnosis Date Noted   Uterine polyp 12/19/2021   Mild intermittent asthma without complication 09/13/2021   COVID-19 05/15/2021   Class 3 severe obesity due to excess calories without serious comorbidity with body mass index (BMI) of 45.0 to 49.9 in adult [facility]) 03/19/2021   Exposure to COVID-19 virus 09/28/2020   Acute non-recurrent frontal sinusitis 09/28/2020   CIN II (cervical intraepithelial neoplasia II) 01/02/2012    Past Surgical History:  Procedure Laterality Date   FOOT SURGERY Bilateral    TONSILLECTOMY AND ADENOIDECTOMY     WISDOM TOOTH EXTRACTION  OB History     Gravida  2   Para  2   Term      Preterm      AB      Living         SAB      IAB      Ectopic      Multiple      Live Births               Home Medications    Prior to Admission medications   Medication Sig Start Date End Date Taking? Authorizing Provider  albuterol (PROVENTIL) (2.5 MG/3ML) 0.083% nebulizer solution Take 3 mLs (2.5 mg total) by nebulization every 6 (six) hours as needed for wheezing or shortness of breath. 08/19/22  Yes Ellsworth Lennox, PA-C  albuterol (VENTOLIN HFA) 108 (90 Base) MCG/ACT inhaler Inhale 2 puffs into the lungs every 6 (six) hours as needed for wheezing or shortness of breath. 08/19/22  Yes Ellsworth Lennox, PA-C  cetirizine (ZYRTEC) 10  MG tablet TK 1 T PO D   Yes [provider]  Cholecalciferol 1.25 MG (50000 UT) capsule TK 1 C PO ONCE WEEKLY   Yes [provider]  clindamycin (CLEOCIN) 150 MG capsule Take 150 mg by mouth 3 (three) times daily. 01/17/23  Yes [provider]  clindamycin (CLEOCIN) 300 MG capsule Take 300 mg by mouth every 6 (six) hours. 02/14/23  Yes [provider]  fluticasone (FLONASE) 50 MCG/ACT nasal spray Place 2 sprays into both nostrils daily. 10/23/20  Yes Barbette Merino, NP  hydrOXYzine (VISTARIL) 25 MG capsule Take 1 capsule (25 mg total) by mouth every 8 (eight) hours as needed. 02/06/22  Yes Barbette Merino, NP  ibuprofen (ADVIL,MOTRIN) 200 MG tablet Take 400 mg by mouth every 6 (six) hours as needed for headache or mild pain.   Yes [provider]  levocetirizine (XYZAL) 5 MG tablet TAKE ONE TABLET BY MOUTH EVERY EVENING 11/04/22  Yes Ivonne Andrew, NP  penicillin v potassium (VEETID) 500 MG tablet Take 500 mg by mouth 4 (four) times daily. 02/03/23  Yes [provider]  amoxicillin-clavulanate (AUGMENTIN) 875-125 MG tablet Take 1 tablet by mouth every 12 (twelve) hours.    [provider]  azithromycin (ZITHROMAX) 250 MG tablet     [provider]  cefdinir (OMNICEF) 300 MG capsule Take 1 capsule (300 mg total) by mouth 2 (two) times daily. 11/02/22   Valinda Hoar, NP  chlorhexidine (PERIDEX) 0.12 % solution SMARTSIG:By Mouth 02/03/23   [provider]  ciclopirox (PENLAC) 8 % solution Apply topically at bedtime. Apply over nail and surrounding skin. Apply daily over previous coat. After seven (7) days, may remove with alcohol and continue cycle. 12/14/22   Gustavus Bryant, FNP  cyclobenzaprine (FLEXERIL) 10 MG tablet Take 1 tablet (10 mg total) by mouth 2 (two) times daily as needed for muscle spasms. Do not drink alcohol or drive while taking this medication.  May cause drowsiness. 04/08/22   Particia Nearing, PA-C   dextromethorphan-guaiFENesin [facility] DM) 30-600 MG 12hr tablet Take 1 tablet by mouth 2 (two) times daily. 08/19/22   Ellsworth Lennox, PA-C  doxycycline (VIBRAMYCIN) 100 MG capsule Take 1 capsule (100 mg total) by mouth 2 (two) times daily. 09/18/22   Tomi Bamberger, PA-C  EPINEPHrine (EPIPEN 2-PAK) 0.3 mg/0.3 mL IJ SOAJ injection Inject 0.3 mg into the muscle as needed for anaphylaxis. 02/06/22   Barbette Merino, NP  Ergocalciferol (VITAMIN D2 PO) TK 1 C PO ONCE A WEEK    [provider]  ferrous gluconate (FERGON) 324 MG tablet Take 1 tablet (324 mg total) by mouth daily with breakfast. 01/07/22 01/07/23  Barbette Merino, NP  fluconazole (DIFLUCAN) 150 MG tablet Take 1 tablet (150 mg total) by mouth daily. 02/26/23   Radford Pax, NP  fluticasone (FLONASE) 50 MCG/ACT nasal spray Place 2 sprays into both nostrils daily. 08/19/22   Ellsworth Lennox, PA-C  ibuprofen (ADVIL) 800 MG tablet Take 800 mg by mouth 3 (three) times daily.    [provider]  LORazepam (ATIVAN) 2 MG tablet Take 2 mg by mouth 2 (two) times daily as needed. 12/23/22   [provider]  meloxicam (MOBIC) 7.5 MG tablet Take 7.5 mg by mouth daily. 01/10/23   [provider]  methocarbamol (ROBAXIN) 500 MG tablet Take 500 mg by mouth 3 (three) times daily. 11/29/22   [provider]  montelukast (SINGULAIR) 10 MG tablet Take 1 tablet (10 mg total) by mouth at bedtime. 10/08/21 10/08/22  Barbette Merino, NP  OMEPRAZOLE PO Take 20 mg by mouth daily.    [provider]  predniSONE (DELTASONE) 10 MG tablet Take 1 tablet (10 mg total) by mouth in the morning, at noon, and at bedtime. 08/19/22   Ellsworth Lennox, PA-C  triamcinolone cream (KENALOG) 0.1 % Apply 1 application topically 2 (two) times daily. Apply for 2 weeks. May use on face Patient taking differently: Apply 1 application  topically 2 (two) times daily as needed. Apply for 2 weeks. May use on face 06/21/21   Barbette Merino, NP    Family  History Family History  Problem Relation Age of Onset   Cancer Mother    HIV Mother    Diabetes Maternal Grandmother    Diabetes Paternal Grandmother    Heart failure Father     Social History Social History   Tobacco Use   Smoking status: Former    Types: Cigarettes   Smokeless tobacco: Never  Vaping Use   Vaping Use: Never used  Substance Use Topics   Alcohol use: No   Drug use: No     Allergies   Codeine, Penicillins, Amoxicillin, Peanut allergen powder-dnfp, Peanut-containing drug products, and Shrimp [shellfish allergy]   Review of Systems Review of Systems Per HPI  Physical Exam Triage Vital Signs ED Triage Vitals  Enc Vitals Group     BP 03/23/23 1057 136/82     Pulse Rate 03/23/23 1057 79     Resp 03/23/23 1057 20     Temp 03/23/23 1057 98.5 F (36.9 C)     Temp Source 03/23/23 1057 Oral     SpO2 03/23/23 1057 98 %     Weight 03/23/23 1052 287 lb (130.2 kg)     Height 03/23/23 1052 5\' 2"  (1.575 m)     Head Circumference --      Peak Flow --      Pain Score 03/23/23 1052 6     Pain Loc --      Pain Edu? --      Excl. in GC? --    No data found.  Updated Vital Signs BP 138/81 (BP Location: Left Arm)   Pulse 79   Temp 98.5 F (36.9 C) (Oral)   Resp 18   Ht 5\' 2"  (1.575 m)   Wt 287 lb (130.2 kg)   LMP 02/08/2023 (Exact Date) Comment: Irregular, and not really  stopped.  SpO2 99%   BMI 52.49 kg/m   Visual Acuity Right Eye Distance:   Left Eye Distance:   Bilateral Distance:    Right Eye Near:   Left Eye Near:    Bilateral Near:     Physical Exam Constitutional:      General: She is not in acute distress.    Appearance: Normal appearance. She is not toxic-appearing or diaphoretic.  HENT:     Head: Normocephalic and atraumatic.  Eyes:     Extraocular Movements: Extraocular movements intact.     Conjunctiva/sclera: Conjunctivae normal.     Pupils: Pupils are equal, round, and reactive to light.  Cardiovascular:     Rate and  Rhythm: Normal rate and regular rhythm.     Pulses: Normal pulses.     Heart sounds: Normal heart sounds.  Pulmonary:     Effort: Pulmonary effort is normal. No respiratory distress.     Breath sounds: Normal breath sounds. No stridor. No wheezing, rhonchi or rales.  Musculoskeletal:     Comments: Patient has very mild tenderness to palpation throughout right shoulder.  No other tenderness to palpation throughout upper extremity.  Patient has full range of motion of upper extremity.  Grip strength is 5/5.  Neurovascular intact.  No swelling noted.  Skin:    Comments: Nonpitting edema present to bilateral lower feet.  Patient has 1-2+ pitting edema present to the lower extremities that extends into the lower thighs.  No discoloration noted.  Patient has normal pedal pulses and capillary refill.  Neurological:     General: No focal deficit present.     Mental Status: She is alert and oriented to person, place, and time. Mental status is at baseline.     Cranial Nerves: Cranial nerves 2-12 are intact.     Sensory: Sensation is intact.     Motor: Motor function is intact.     Coordination: Coordination is intact.     Gait: Gait is intact.  Psychiatric:        Mood and Affect: Mood normal.        Behavior: Behavior normal.        Thought Content: Thought content normal.        Judgment: Judgment normal.      UC Treatments / Results  Labs (all labs ordered are listed, but only abnormal results are displayed) Labs Reviewed - No data to display  EKG   Radiology No results found.  Procedures Procedures (including critical care time)  Medications Ordered in UC Medications - No data to display  Initial Impression / Assessment and Plan / UC Course  I have reviewed the triage vital signs and the nursing notes.  Pertinent labs & imaging results that were available during my care of the patient were reviewed by me and considered in my medical decision making (see chart for  details).     1.  Right arm pain  Do not think imaging is necessary as there is no direct bony tenderness and symptoms have been present for multiple months.  Also has full range of motion with no crepitus noted.  Recommended to patient that she see an orthopedist so she was provided with contact information for this and advised to follow up as soon as possible.  Advised safe over-the-counter pain relievers and supportive care.  2.  Fatigue, vaginal bleeding, bilateral lower extremity swelling  EKG was completed by clinical staff prior to provider evaluation.  It was unremarkable. Patient has  significant edema that is present from the feet all the way up to the lower thighs.  This is concerning that patient needs a heart failure workup and more extensive evaluation.  Patient most likely also needs some stat blood work including a CBC given vaginal bleeding and recent weakness.  Discussed with patient that I do not have the adequate resources here to do a full evaluation but I can do a limited workup at   urgent care and then have her follow-up with PCP.  Although, highest recommendation is for her to go the ER today to have a more extensive evaluation.  Patient wishes to go to the ER for further evaluation and management.  Given vital signs are stable, patient left via self transport. Final Clinical Impressions(s) / UC Diagnoses   Final diagnoses:  Localized swelling, mass, or lump of lower extremity, bilateral  Right arm pain  Other fatigue     Discharge Instructions      I recommend that you go to the emergency department for further evaluation and management of your lower extremity swelling and fatigue as I do think that you need a more extensive evaluation than can be provided here in urgent care.  For your arm pain, please follow-up with orthopedist at provided contact information to schedule an appointment tomorrow.    ED Prescriptions   None    PDMP not reviewed this  encounter.   Gustavus Bryant, [place] 03/23/23 1226

## 2023-03-25 DIAGNOSIS — Z1329 Encounter for screening for other suspected endocrine disorder: Secondary | ICD-10-CM | POA: Diagnosis not present

## 2023-03-25 DIAGNOSIS — D508 Other iron deficiency anemias: Secondary | ICD-10-CM | POA: Diagnosis not present

## 2023-03-25 DIAGNOSIS — R5383 Other fatigue: Secondary | ICD-10-CM | POA: Diagnosis not present

## 2023-03-25 DIAGNOSIS — Z13220 Encounter for screening for lipoid disorders: Secondary | ICD-10-CM | POA: Diagnosis not present

## 2023-03-25 DIAGNOSIS — G4762 Sleep related leg cramps: Secondary | ICD-10-CM | POA: Diagnosis not present

## 2023-03-25 DIAGNOSIS — R252 Cramp and spasm: Secondary | ICD-10-CM | POA: Diagnosis not present

## 2023-03-25 DIAGNOSIS — R600 Localized edema: Secondary | ICD-10-CM | POA: Diagnosis not present

## 2023-03-25 DIAGNOSIS — R030 Elevated blood-pressure reading, without diagnosis of hypertension: Secondary | ICD-10-CM | POA: Diagnosis not present

## 2023-03-25 DIAGNOSIS — Z7689 Persons encountering health services in other specified circumstances: Secondary | ICD-10-CM | POA: Diagnosis not present

## 2023-03-26 DIAGNOSIS — R5383 Other fatigue: Secondary | ICD-10-CM | POA: Insufficient documentation

## 2023-03-26 DIAGNOSIS — R600 Localized edema: Secondary | ICD-10-CM | POA: Insufficient documentation

## 2023-03-26 DIAGNOSIS — Z7689 Persons encountering health services in other specified circumstances: Secondary | ICD-10-CM | POA: Insufficient documentation

## 2023-03-26 DIAGNOSIS — R252 Cramp and spasm: Secondary | ICD-10-CM | POA: Insufficient documentation

## 2023-03-26 DIAGNOSIS — R030 Elevated blood-pressure reading, without diagnosis of hypertension: Secondary | ICD-10-CM | POA: Insufficient documentation

## 2023-04-21 DIAGNOSIS — J4530 Mild persistent asthma, uncomplicated: Secondary | ICD-10-CM | POA: Diagnosis not present

## 2023-04-21 DIAGNOSIS — F331 Major depressive disorder, recurrent, moderate: Secondary | ICD-10-CM | POA: Diagnosis not present

## 2023-04-21 DIAGNOSIS — D508 Other iron deficiency anemias: Secondary | ICD-10-CM | POA: Diagnosis not present

## 2023-04-21 DIAGNOSIS — R030 Elevated blood-pressure reading, without diagnosis of hypertension: Secondary | ICD-10-CM | POA: Diagnosis not present

## 2023-04-21 DIAGNOSIS — J301 Allergic rhinitis due to pollen: Secondary | ICD-10-CM | POA: Diagnosis not present

## 2023-04-21 DIAGNOSIS — F411 Generalized anxiety disorder: Secondary | ICD-10-CM | POA: Diagnosis not present

## 2023-04-23 DIAGNOSIS — F4323 Adjustment disorder with mixed anxiety and depressed mood: Secondary | ICD-10-CM | POA: Insufficient documentation

## 2023-04-23 DIAGNOSIS — D508 Other iron deficiency anemias: Secondary | ICD-10-CM | POA: Insufficient documentation

## 2023-04-23 DIAGNOSIS — J301 Allergic rhinitis due to pollen: Secondary | ICD-10-CM | POA: Insufficient documentation

## 2023-04-23 DIAGNOSIS — F331 Major depressive disorder, recurrent, moderate: Secondary | ICD-10-CM | POA: Insufficient documentation

## 2023-09-08 DIAGNOSIS — R600 Localized edema: Secondary | ICD-10-CM | POA: Diagnosis not present

## 2023-09-08 DIAGNOSIS — F331 Major depressive disorder, recurrent, moderate: Secondary | ICD-10-CM | POA: Diagnosis not present

## 2023-09-08 DIAGNOSIS — Z1329 Encounter for screening for other suspected endocrine disorder: Secondary | ICD-10-CM | POA: Diagnosis not present

## 2023-09-08 DIAGNOSIS — J301 Allergic rhinitis due to pollen: Secondary | ICD-10-CM | POA: Diagnosis not present

## 2023-09-08 DIAGNOSIS — F411 Generalized anxiety disorder: Secondary | ICD-10-CM | POA: Diagnosis not present

## 2023-09-08 DIAGNOSIS — Z01411 Encounter for gynecological examination (general) (routine) with abnormal findings: Secondary | ICD-10-CM | POA: Diagnosis not present

## 2023-09-08 DIAGNOSIS — Z13220 Encounter for screening for lipoid disorders: Secondary | ICD-10-CM | POA: Diagnosis not present

## 2023-09-08 DIAGNOSIS — D508 Other iron deficiency anemias: Secondary | ICD-10-CM | POA: Diagnosis not present

## 2023-11-06 DIAGNOSIS — R051 Acute cough: Secondary | ICD-10-CM | POA: Insufficient documentation

## 2023-11-06 DIAGNOSIS — J018 Other acute sinusitis: Secondary | ICD-10-CM | POA: Diagnosis not present

## 2024-04-14 DIAGNOSIS — R051 Acute cough: Secondary | ICD-10-CM | POA: Diagnosis not present

## 2024-04-14 DIAGNOSIS — Z7689 Persons encountering health services in other specified circumstances: Secondary | ICD-10-CM | POA: Diagnosis not present

## 2024-04-14 DIAGNOSIS — F331 Major depressive disorder, recurrent, moderate: Secondary | ICD-10-CM | POA: Diagnosis not present

## 2024-04-14 DIAGNOSIS — I10 Essential (primary) hypertension: Secondary | ICD-10-CM | POA: Diagnosis not present

## 2024-04-14 DIAGNOSIS — J018 Other acute sinusitis: Secondary | ICD-10-CM | POA: Diagnosis not present

## 2024-04-14 DIAGNOSIS — M25579 Pain in unspecified ankle and joints of unspecified foot: Secondary | ICD-10-CM | POA: Insufficient documentation

## 2024-04-14 DIAGNOSIS — M25571 Pain in right ankle and joints of right foot: Secondary | ICD-10-CM | POA: Diagnosis not present

## 2024-04-14 DIAGNOSIS — J4540 Moderate persistent asthma, uncomplicated: Secondary | ICD-10-CM | POA: Diagnosis not present

## 2024-04-14 DIAGNOSIS — D508 Other iron deficiency anemias: Secondary | ICD-10-CM | POA: Diagnosis not present

## 2024-04-14 DIAGNOSIS — F4323 Adjustment disorder with mixed anxiety and depressed mood: Secondary | ICD-10-CM | POA: Diagnosis not present

## 2024-04-14 DIAGNOSIS — M25572 Pain in left ankle and joints of left foot: Secondary | ICD-10-CM | POA: Diagnosis not present

## 2024-04-14 DIAGNOSIS — F411 Generalized anxiety disorder: Secondary | ICD-10-CM | POA: Diagnosis not present

## 2024-04-14 DIAGNOSIS — R5383 Other fatigue: Secondary | ICD-10-CM | POA: Diagnosis not present

## 2024-06-21 ENCOUNTER — Ambulatory Visit
Admission: RE | Admit: 2024-06-21 | Discharge: 2024-06-21 | Disposition: A | Discharge status: Home / Self Care | Payer: Self-pay | Source: Ambulatory Visit | Attending: Emergency Medicine | Admitting: Emergency Medicine

## 2024-06-21 VITALS — BP 131/80 | HR 88 | Temp 98.2°F | Resp 18 | Wt 280.0 lb

## 2024-06-21 DIAGNOSIS — R8290 Unspecified abnormal findings in urine: Secondary | ICD-10-CM | POA: Diagnosis not present

## 2024-06-21 DIAGNOSIS — N898 Other specified noninflammatory disorders of vagina: Secondary | ICD-10-CM

## 2024-06-21 DIAGNOSIS — Z113 Encounter for screening for infections with a predominantly sexual mode of transmission: Secondary | ICD-10-CM | POA: Diagnosis not present

## 2024-06-21 LAB — POCT URINE DIPSTICK
Glucose, UA: NEGATIVE mg/dL
Nitrite, UA: NEGATIVE
Protein Ur, POC: 100 mg/dL — AB
Spec Grav, UA: >=1.03 — AB (ref 1.010–1.025)
Urobilinogen, UA: 0.2 U/dL
pH, UA: 5.5 (ref 5.0–8.0)

## 2024-06-21 NOTE — Discharge Instructions (Addendum)
 We will call you if anything on your vaginal swab or urine culture returns positive. You can also see these results on MyChart. Please abstain from sexual intercourse until your results return.

## 2024-06-21 NOTE — ED Provider Notes (Signed)
 [name]    CSN: [id number] Arrival date & time: 06/21/24  0946      History   Chief Complaint Chief Complaint  Patient presents with   Vaginal Discharge    Discharge no odor an A Little discomfort ... cloudy pee - Entered by patient    HPI Melissa Guerra is a 43 y.o. female.  Here with vaginal discharge for 2-3 days Not having odor or itching Maybe some cloudy urine, no dysuria or hematuria Would like STD testing  LMP 7/16. Irregular cycles   Past Medical History:  Diagnosis Date   Abnormal Pap smear    Acid reflux    Anemia    Anxiety    Arthritis    Asthma    Depression    Dyspnea    Obesity    Substance abuse (HCC)     Patient Active Problem List   Diagnosis Date Noted   Uterine polyp 12/19/2021   Mild intermittent asthma without complication 09/13/2021   COVID-19 05/15/2021   Class 3 severe obesity due to excess calories without serious comorbidity with body mass index (BMI) of 45.0 to 49.9 in adult 03/19/2021   Exposure to COVID-19 virus 09/28/2020   Acute non-recurrent frontal sinusitis 09/28/2020   CIN II (cervical intraepithelial neoplasia II) 01/02/2012    Past Surgical History:  Procedure Laterality Date   FOOT SURGERY Bilateral    TONSILLECTOMY AND ADENOIDECTOMY     WISDOM TOOTH EXTRACTION      OB History     Gravida  2   Para  2   Term      Preterm      AB      Living         SAB      IAB      Ectopic      Multiple      Live Births               Home Medications    Prior to Admission medications   Medication Sig Start Date End Date Taking? Authorizing Provider  albuterol  (PROVENTIL ) (2.5 MG/3ML) 0.083% nebulizer solution Take 3 mLs (2.5 mg total) by nebulization every 6 (six) hours as needed for wheezing or shortness of breath. 08/19/22   Lynwood Lenis, PA-C  albuterol  (VENTOLIN  HFA) 108 (90 Base) MCG/ACT inhaler Inhale 2 puffs into the lungs every 6 (six) hours as needed for wheezing or shortness  of breath. 08/19/22   Lynwood Lenis, PA-C  amoxicillin -clavulanate (AUGMENTIN ) 875-125 MG tablet Take 1 tablet by mouth every 12 (twelve) hours.    [provider]  azithromycin  (ZITHROMAX ) 250 MG tablet     [provider]  cefdinir  (OMNICEF ) 300 MG capsule Take 1 capsule (300 mg total) by mouth 2 (two) times daily. 11/02/22   Teresa Shelba SAUNDERS, NP  cetirizine  (ZYRTEC ) 10 MG tablet TK 1 T PO D    [provider]  chlorhexidine (PERIDEX) 0.12 % solution SMARTSIG:By Mouth 02/03/23   [provider]  Cholecalciferol 1.25 MG (50000 UT) capsule TK 1 C PO ONCE WEEKLY    [provider]  ciclopirox  (PENLAC ) 8 % solution Apply topically at bedtime. Apply over nail and surrounding skin. Apply daily over previous coat. After seven (7) days, may remove with alcohol and continue cycle. 12/14/22   Hazen Darryle BRAVO, FNP  clindamycin (CLEOCIN) 150 MG capsule Take 150 mg by mouth 3 (three) times daily. 01/17/23   [provider]  clindamycin (CLEOCIN)  300 MG capsule Take 300 mg by mouth every 6 (six) hours. 02/14/23   [provider]  cyclobenzaprine  (FLEXERIL ) 10 MG tablet Take 1 tablet (10 mg total) by mouth 2 (two) times daily as needed for muscle spasms. Do not drink alcohol or drive while taking this medication.  May cause drowsiness. 04/08/22   Stuart Vernell Norris, PA-C  dextromethorphan-guaiFENesin  (MUCINEX  DM) 30-600 MG 12hr tablet Take 1 tablet by mouth 2 (two) times daily. 08/19/22   Lynwood Lenis, PA-C  doxycycline  (VIBRAMYCIN ) 100 MG capsule Take 1 capsule (100 mg total) by mouth 2 (two) times daily. 09/18/22   Billy [place] FALCON, PA-C  EPINEPHrine  (EPIPEN  2-PAK) 0.3 mg/0.3 mL IJ SOAJ injection Inject 0.3 mg into the muscle as needed for anaphylaxis. 02/06/22   Myrna Camelia HERO, NP  Ergocalciferol  (VITAMIN D2 PO) TK 1 C PO ONCE A WEEK    [provider]  ferrous gluconate  (FERGON) 324 MG tablet Take 1 tablet (324 mg total) by mouth daily with  breakfast. 01/07/22 01/07/23  Myrna Camelia HERO, NP  fluconazole  (DIFLUCAN ) 150 MG tablet Take 1 tablet (150 mg total) by mouth daily. 02/26/23   Mayer, Jodi R, NP  fluticasone  (FLONASE ) 50 MCG/ACT nasal spray Place 2 sprays into both nostrils daily. 10/23/20   Myrna Camelia HERO, NP  fluticasone  (FLONASE ) 50 MCG/ACT nasal spray Place 2 sprays into both nostrils daily. 08/19/22   Lynwood Lenis, PA-C  hydrochlorothiazide  (HYDRODIURIL ) 25 MG tablet Take 1 tablet (25 mg total) by mouth daily. 03/23/23   Zammit, Joseph, MD  hydrOXYzine  (VISTARIL ) 25 MG capsule Take 1 capsule (25 mg total) by mouth every 8 (eight) hours as needed. 02/06/22   Myrna Camelia HERO, NP  ibuprofen  (ADVIL ) 800 MG tablet Take 800 mg by mouth 3 (three) times daily.    [provider]  ibuprofen  (ADVIL ,MOTRIN ) 200 MG tablet Take 400 mg by mouth every 6 (six) hours as needed for headache or mild pain.    [provider]  levocetirizine (XYZAL ) 5 MG tablet TAKE ONE TABLET BY MOUTH EVERY EVENING 11/04/22   Nichols, Tonya S, NP  LORazepam (ATIVAN) 2 MG tablet Take 2 mg by mouth 2 (two) times daily as needed. 12/23/22   [provider]  meloxicam  (MOBIC ) 7.5 MG tablet Take 7.5 mg by mouth daily. 01/10/23   [provider]  methocarbamol  (ROBAXIN ) 500 MG tablet Take 500 mg by mouth 3 (three) times daily. 11/29/22   [provider]  montelukast  (SINGULAIR ) 10 MG tablet Take 1 tablet (10 mg total) by mouth at bedtime. 10/08/21 10/08/22  Myrna Camelia HERO, NP  OMEPRAZOLE  PO Take 20 mg by mouth daily.    [provider]  penicillin  v potassium (VEETID) 500 MG tablet Take 500 mg by mouth 4 (four) times daily. 02/03/23   [provider]  predniSONE  (DELTASONE ) 10 MG tablet Take 1 tablet (10 mg total) by mouth in the morning, at noon, and at bedtime. 08/19/22   Lynwood Lenis, PA-C  triamcinolone  cream (KENALOG ) 0.1 % Apply 1 application topically 2 (two) times daily. Apply for 2 weeks. May use on  face Patient taking differently: Apply 1 application  topically 2 (two) times daily as needed. Apply for 2 weeks. May use on face 06/21/21   Myrna Camelia HERO, NP    Family History Family History  Problem Relation Age of Onset   Cancer Mother    HIV Mother    Diabetes Maternal Grandmother    Diabetes Paternal Grandmother  Heart failure Father     Social History Social History   Tobacco Use   Smoking status: Former    Types: Cigarettes    Passive exposure: Past   Smokeless tobacco: Never  Vaping Use   Vaping status: Never Used  Substance Use Topics   Alcohol use: No   Drug use: No     Allergies   Codeine, Penicillins, Amoxicillin , Peanut allergen powder-dnfp, Peanut-containing drug products, and Shrimp [shellfish allergy]   Review of Systems Review of Systems  Genitourinary:  Positive for vaginal discharge.   As per HPI  Physical Exam Triage Vital Signs ED Triage Vitals  Encounter Vitals Group     BP 06/21/24 0955 131/80     Girls Systolic BP Percentile --      Girls Diastolic BP Percentile --      Boys Systolic BP Percentile --      Boys Diastolic BP Percentile --      Pulse Rate 06/21/24 0955 (!) 104     Resp 06/21/24 0955 18     Temp 06/21/24 0955 98.2 F (36.8 C)     Temp Source 06/21/24 0955 Oral     SpO2 06/21/24 0955 98 %     Weight 06/21/24 0954 280 lb (127 kg)     Height --      Head Circumference --      Peak Flow --      Pain Score 06/21/24 0954 0     Pain Loc --      Pain Education --      Exclude from Growth Chart --    No data found.  Updated Vital Signs BP 131/80 (BP Location: Left Arm)   Pulse 88   Temp 98.2 F (36.8 C) (Oral)   Resp 18   Wt 280 lb (127 kg)   LMP 06/02/2024 (Approximate)   SpO2 98%   BMI 51.21 kg/m   Visual Acuity Right Eye Distance:   Left Eye Distance:   Bilateral Distance:    Right Eye Near:   Left Eye Near:    Bilateral Near:     Physical Exam Vitals and nursing note reviewed.  Constitutional:       General: She is not in acute distress. HENT:     Mouth/Throat:     Mouth: Mucous membranes are moist.     Pharynx: Oropharynx is clear.  Eyes:     Conjunctiva/sclera: Conjunctivae normal.     Pupils: Pupils are equal, round, and reactive to light.  Cardiovascular:     Rate and Rhythm: Normal rate and regular rhythm.     Heart sounds: Normal heart sounds.  Pulmonary:     Effort: Pulmonary effort is normal.     Breath sounds: Normal breath sounds.  Abdominal:     General: Bowel sounds are normal.     Palpations: Abdomen is soft.     Tenderness: There is no abdominal tenderness. There is no right CVA tenderness or left CVA tenderness.     Comments: [place] limits exam  Neurological:     Mental Status: She is alert and oriented to person, place, and time.      UC Treatments / Results  Labs (all labs ordered are listed, but only abnormal results are displayed) Labs Reviewed  POCT URINE DIPSTICK - Abnormal; Notable for the following components:      Result Value   Clarity, UA cloudy (*)    Bilirubin, UA small (*)    Ketones, POC UA  small (15) (*)    Spec Grav, UA >=1.030 (*)    Blood, UA trace-intact (*)    Protein Ur, POC =100 (*)    Leukocytes, UA Small (1+) (*)    All other components within normal limits  URINE CULTURE  CERVICOVAGINAL ANCILLARY ONLY    EKG   Radiology No results found.  Procedures Procedures (including critical care time)  Medications Ordered in UC Medications - No data to display  Initial Impression / Assessment and Plan / UC Course  I have reviewed the triage vital signs and the nursing notes.  Pertinent labs & imaging results that were available during my care of the patient were reviewed by me and considered in my medical decision making (see chart for details).  UA small leuks, trace RBC, elevated spec grav. Will culture.  Cytology swab pending. Treat positive result as indicated Increase fluids recommended Agrees to plan, no  questions   Final Clinical Impressions(s) / UC Diagnoses   Final diagnoses:  Cloudy urine  Vaginal discharge  Screen for STD (sexually transmitted disease)     Discharge Instructions      We will call you if anything on your vaginal swab or urine culture returns positive. You can also see these results on MyChart. Please abstain from sexual intercourse until your results return.      ED Prescriptions   None    PDMP not reviewed this encounter.   Jeramine Delis, [place], [place] 06/21/24 1056

## 2024-06-21 NOTE — ED Triage Notes (Signed)
 Discharge no odor an A Little discomfort ... cloudy pee - Entered by patient  Pt presents c/o vaginal discharge. Pt is not sure of the coloring. Sxs onset 3 days ago.

## 2024-06-22 ENCOUNTER — Ambulatory Visit (HOSPITAL_COMMUNITY): Payer: Self-pay

## 2024-06-22 LAB — CERVICOVAGINAL ANCILLARY ONLY
Bacterial Vaginitis (gardnerella): NEGATIVE
Candida Glabrata: NEGATIVE
Candida Vaginitis: POSITIVE — AB
Chlamydia: NEGATIVE
Comment: NEGATIVE
Comment: NEGATIVE
Comment: NEGATIVE
Comment: NEGATIVE
Comment: NEGATIVE
Comment: NORMAL
Neisseria Gonorrhea: NEGATIVE
Trichomonas: NEGATIVE

## 2024-06-22 LAB — URINE CULTURE: Culture: 80000 — AB

## 2024-06-22 MED ORDER — FLUCONAZOLE 150 MG PO TABS: 150.0000 mg | ORAL_TABLET | Freq: Once | ORAL | 0 refills | Status: AC

## 2024-07-02 ENCOUNTER — Encounter (INDEPENDENT_AMBULATORY_CARE_PROVIDER_SITE_OTHER): Payer: Self-pay

## 2024-07-17 ENCOUNTER — Ambulatory Visit
Admission: EM | Admit: 2024-07-17 | Discharge: 2024-07-17 | Disposition: A | Discharge status: Home / Self Care | Attending: Family Medicine | Admitting: Family Medicine

## 2024-07-17 DIAGNOSIS — R52 Pain, unspecified: Secondary | ICD-10-CM

## 2024-07-17 DIAGNOSIS — J309 Allergic rhinitis, unspecified: Secondary | ICD-10-CM | POA: Diagnosis not present

## 2024-07-17 DIAGNOSIS — B9789 Other viral agents as the cause of diseases classified elsewhere: Secondary | ICD-10-CM | POA: Diagnosis not present

## 2024-07-17 DIAGNOSIS — J4531 Mild persistent asthma with (acute) exacerbation: Secondary | ICD-10-CM | POA: Diagnosis not present

## 2024-07-17 DIAGNOSIS — J988 Other specified respiratory disorders: Secondary | ICD-10-CM | POA: Diagnosis not present

## 2024-07-17 LAB — POC COVID19/FLU A&B COMBO
Covid Antigen, POC: NEGATIVE
Influenza A Antigen, POC: NEGATIVE
Influenza B Antigen, POC: NEGATIVE

## 2024-07-17 MED ORDER — ALBUTEROL SULFATE HFA 108 (90 BASE) MCG/ACT IN AERS: 2.0000 | INHALATION_SPRAY | Freq: Four times a day (QID) | RESPIRATORY_TRACT | 0 refills | Status: DC | PRN

## 2024-07-17 MED ORDER — PROMETHAZINE-DM 6.25-15 MG/5ML PO SYRP: 5.0000 mL | ORAL_SOLUTION | Freq: Three times a day (TID) | ORAL | 0 refills | Status: DC | PRN

## 2024-07-17 MED ORDER — ALBUTEROL SULFATE (2.5 MG/3ML) 0.083% IN NEBU: 2.5000 mg | INHALATION_SOLUTION | Freq: Four times a day (QID) | RESPIRATORY_TRACT | 0 refills | Status: AC | PRN

## 2024-07-17 MED ORDER — LEVOCETIRIZINE DIHYDROCHLORIDE 5 MG PO TABS
5.0000 mg | ORAL_TABLET | Freq: Every evening | ORAL | 0 refills | Status: AC
Start: 2024-07-17 — End: ?

## 2024-07-17 MED ORDER — PREDNISONE 20 MG PO TABS: ORAL_TABLET | ORAL | 0 refills | Status: DC

## 2024-07-17 NOTE — ED Triage Notes (Signed)
 Pt c/o nasal congestion/runny nose, HA, body aches, mild cough with mild SHOB and throat irritation x 3 days-taking mucinex  and albuterol  neb-NAD-steady gait

## 2024-07-17 NOTE — ED Provider Notes (Signed)
 [facility]  Note:  This document was prepared using Conservation officer, historic buildings and may include unintentional dictation errors.  MRN: [id number] DOB: November 20, 1980  Subjective:   Melissa Guerra is a 43 y.o. female presenting for 3 day history of malaise, fatigue, body aches, sinus headaches, mild intermittent shob, post-nasal drainage. Has been using her albuterol , did a breathing treatment at 04:00 today. No smoking of any kind including cigarettes, cigars, vaping, marijuana use.    No current facility-administered medications for this encounter.  Current Outpatient Medications:    albuterol  (PROVENTIL ) (2.5 MG/3ML) 0.083% nebulizer solution, Take 3 mLs (2.5 mg total) by nebulization every 6 (six) hours as needed for wheezing or shortness of breath., Disp: 75 mL, Rfl: 12   albuterol  (VENTOLIN  HFA) 108 (90 Base) MCG/ACT inhaler, Inhale 2 puffs into the lungs every 6 (six) hours as needed for wheezing or shortness of breath., Disp: 8 g, Rfl: 11   amoxicillin -clavulanate (AUGMENTIN ) 875-125 MG tablet, Take 1 tablet by mouth every 12 (twelve) hours., Disp: , Rfl:    azithromycin  (ZITHROMAX ) 250 MG tablet, , Disp: , Rfl:    cefdinir  (OMNICEF ) 300 MG capsule, Take 1 capsule (300 mg total) by mouth 2 (two) times daily., Disp: 20 capsule, Rfl: 0   cetirizine  (ZYRTEC ) 10 MG tablet, TK 1 T PO D, Disp: , Rfl:    chlorhexidine (PERIDEX) 0.12 % solution, SMARTSIG:By Mouth, Disp: , Rfl:    Cholecalciferol 1.25 MG (50000 UT) capsule, TK 1 C PO ONCE WEEKLY, Disp: , Rfl:    ciclopirox  (PENLAC ) 8 % solution, Apply topically at bedtime. Apply over nail and surrounding skin. Apply daily over previous coat. After seven (7) days, may remove with alcohol and continue cycle., Disp: 6.6 mL, Rfl: 0   clindamycin (CLEOCIN) 150 MG capsule, Take 150 mg by mouth 3 (three) times daily., Disp: , Rfl:    clindamycin (CLEOCIN) 300 MG capsule, Take 300 mg by mouth every 6 (six) hours., Disp: ,  Rfl:    cyclobenzaprine  (FLEXERIL ) 10 MG tablet, Take 1 tablet (10 mg total) by mouth 2 (two) times daily as needed for muscle spasms. Do not drink alcohol or drive while taking this medication.  May cause drowsiness., Disp: 20 tablet, Rfl: 0   dextromethorphan-guaiFENesin  (MUCINEX  DM) 30-600 MG 12hr tablet, Take 1 tablet by mouth 2 (two) times daily., Disp: 14 tablet, Rfl: 0   diazepam (VALIUM) 2 MG tablet, Take 2 mg by mouth 2 (two) times daily., Disp: , Rfl:    doxycycline  (VIBRAMYCIN ) 100 MG capsule, Take 1 capsule (100 mg total) by mouth 2 (two) times daily., Disp: 20 capsule, Rfl: 0   EPINEPHrine  (EPIPEN  2-PAK) 0.3 mg/0.3 mL IJ SOAJ injection, Inject 0.3 mg into the muscle as needed for anaphylaxis., Disp: 1 each, Rfl: 0   Ergocalciferol  (VITAMIN D2 PO), TK 1 C PO ONCE A WEEK, Disp: , Rfl:    ferrous gluconate  (FERGON) 324 MG tablet, Take 1 tablet (324 mg total) by mouth daily with breakfast., Disp: 90 tablet, Rfl: 0   fluconazole  (DIFLUCAN ) 150 MG tablet, Take 1 tablet (150 mg total) by mouth daily., Disp: 1 tablet, Rfl: 0   fluticasone  (FLONASE ) 50 MCG/ACT nasal spray, Place 2 sprays into both nostrils daily., Disp: 1 g, Rfl: 5   fluticasone  (FLONASE ) 50 MCG/ACT nasal spray, Place 2 sprays into both nostrils daily., Disp: 11.1 mL, Rfl: 2   hydrochlorothiazide  (HYDRODIURIL ) 25 MG tablet, Take 1 tablet (25 mg total) by mouth daily., Disp:  30 tablet, Rfl: 0   hydrOXYzine  (VISTARIL ) 25 MG capsule, Take 1 capsule (25 mg total) by mouth every 8 (eight) hours as needed., Disp: 60 capsule, Rfl: 2   ibuprofen  (ADVIL ) 800 MG tablet, Take 800 mg by mouth 3 (three) times daily., Disp: , Rfl:    ibuprofen  (ADVIL ,MOTRIN ) 200 MG tablet, Take 400 mg by mouth every 6 (six) hours as needed for headache or mild pain., Disp: , Rfl:    levocetirizine (XYZAL ) 5 MG tablet, TAKE ONE TABLET BY MOUTH EVERY EVENING, Disp: 30 tablet, Rfl: 0   LORazepam (ATIVAN) 2 MG tablet, Take 2 mg by mouth 2 (two) times daily as  needed., Disp: , Rfl:    meloxicam  (MOBIC ) 7.5 MG tablet, Take 7.5 mg by mouth daily., Disp: , Rfl:    methocarbamol  (ROBAXIN ) 500 MG tablet, Take 500 mg by mouth 3 (three) times daily., Disp: , Rfl:    methylPREDNISolone  (MEDROL  DOSEPAK) 4 MG TBPK tablet, Take 4 mg by mouth as directed., Disp: , Rfl:    montelukast  (SINGULAIR ) 10 MG tablet, Take 1 tablet (10 mg total) by mouth at bedtime., Disp: 90 tablet, Rfl: 3   OMEPRAZOLE  PO, Take 20 mg by mouth daily., Disp: , Rfl:    penicillin  v potassium (VEETID) 500 MG tablet, Take 500 mg by mouth 4 (four) times daily., Disp: , Rfl:    predniSONE  (DELTASONE ) 10 MG tablet, Take 1 tablet (10 mg total) by mouth in the morning, at noon, and at bedtime., Disp: 15 tablet, Rfl: 0   triamcinolone  cream (KENALOG ) 0.1 %, Apply 1 application topically 2 (two) times daily. Apply for 2 weeks. May use on face (Patient taking differently: Apply 1 application  topically 2 (two) times daily as needed. Apply for 2 weeks. May use on face), Disp: 30 g, Rfl: 0   UNABLE TO FIND, AIRIAL COMPRESSOR, Disp: , Rfl:    Vitamin D , Ergocalciferol , (DRISDOL ) 1.25 MG (50000 UNIT) CAPS capsule, Take 50,000 Units by mouth once a week., Disp: , Rfl:    Allergies  Allergen Reactions   Codeine Anaphylaxis and Other (See Comments)    Patient was hospitalized   Penicillins Hives and Nausea And Vomiting   Amoxicillin  Hives and Itching   Peanut Allergen Powder-Dnfp    Peanut-Containing Drug Products Hives, Itching and Other (See Comments)   Shellfish Allergy     Past Medical History:  Diagnosis Date   Abnormal Pap smear    Acid reflux    Anemia    Anxiety    Arthritis    Asthma    Depression    Dyspnea    Obesity    Substance abuse (HCC)      Past Surgical History:  Procedure Laterality Date   FOOT SURGERY Bilateral    TONSILLECTOMY AND ADENOIDECTOMY     WISDOM TOOTH EXTRACTION      Family History  Problem Relation Age of Onset   Cancer Mother    HIV Mother     Diabetes Maternal Grandmother    Diabetes Paternal Grandmother    Heart failure Father     Social History   Tobacco Use   Smoking status: Former    Types: Cigarettes    Passive exposure: Past   Smokeless tobacco: Never  Vaping Use   Vaping status: Never Used  Substance Use Topics   Alcohol use: No   Drug use: No    ROS   Objective:   Vitals: BP 128/84 (BP Location: Right Arm)   Pulse  68   Temp 98.6 F (37 C) (Oral)   Resp 20   LMP 06/30/2024 (Approximate)   SpO2 98%   Physical Exam Constitutional:      General: She is not in acute distress.    Appearance: Normal appearance. She is well-developed and normal weight. She is not ill-appearing, toxic-appearing or diaphoretic.  HENT:     Head: Normocephalic and atraumatic.     Right Ear: Tympanic membrane, ear canal and external ear normal. No drainage or tenderness. No middle ear effusion. There is no impacted cerumen. Tympanic membrane is not erythematous or bulging.     Left Ear: Tympanic membrane, ear canal and external ear normal. No drainage or tenderness.  No middle ear effusion. There is no impacted cerumen. Tympanic membrane is not erythematous or bulging.     Nose: Nose normal. No congestion or rhinorrhea.     Mouth/Throat:     Mouth: Mucous membranes are moist. No oral lesions.     Pharynx: No pharyngeal swelling, oropharyngeal exudate, posterior oropharyngeal erythema or uvula swelling.     Tonsils: No tonsillar exudate or tonsillar abscesses.  Eyes:     General: No scleral icterus.       Right eye: No discharge.        Left eye: No discharge.     Extraocular Movements: Extraocular movements intact.     Right eye: Normal extraocular motion.     Left eye: Normal extraocular motion.     Conjunctiva/sclera: Conjunctivae normal.  Cardiovascular:     Rate and Rhythm: Normal rate and regular rhythm.     Heart sounds: Normal heart sounds. No murmur heard.    No friction rub. No gallop.  Pulmonary:     Effort:  Pulmonary effort is normal. No respiratory distress.     Breath sounds: No stridor. No wheezing, rhonchi or rales.  Chest:     Chest wall: No tenderness.  Musculoskeletal:     Cervical back: Normal range of motion and neck supple.  Lymphadenopathy:     Cervical: No cervical adenopathy.  Skin:    General: Skin is warm and dry.  Neurological:     General: No focal deficit present.     Mental Status: She is alert and oriented to person, place, and time.  Psychiatric:        Mood and Affect: Mood normal.        Behavior: Behavior normal.    Negative COVID flu testing.   Assessment and Plan :   PDMP not reviewed this encounter.  1. Viral respiratory infection   2. Body aches   3. Mild persistent asthma with (acute) exacerbation   4. Allergic rhinitis, unspecified seasonality, unspecified trigger    Recommend an oral prednisone  course for the significant respiratory symptoms in the context of asthma, allergies. Use supportive care otherwise. Deferred imaging given clear cardiopulmonary exam, hemodynamically stable vital signs. Counseled patient on potential for adverse effects with medications prescribed today, patient verbalized understanding.    Christopher Savannah, [place] 07/17/24 1235

## 2024-07-17 NOTE — Discharge Instructions (Addendum)
 We will manage this as a viral respiratory infection. For sore throat or cough try using a honey-based tea. Use 3 teaspoons of honey with juice squeezed from half lemon. Place shaved pieces of ginger into 1/2-1 cup of water and warm over stove top. Then mix the ingredients and repeat every 4 hours as needed. Please take Tylenol 500mg -650mg  once every 6 hours for fevers, aches and pains. Hydrate very well with at least 2 liters (64 ounces) of water. Eat light meals such as soups (chicken and noodles, chicken wild rice, vegetable).  Do not eat any foods that you are allergic to.  Start an antihistamine like Zyrtec (10mg  daily) for postnasal drainage, sinus congestion.  You can take this together with prednisone and albuterol. Use cough syrup as needed.

## 2024-07-27 ENCOUNTER — Encounter (INDEPENDENT_AMBULATORY_CARE_PROVIDER_SITE_OTHER): Payer: Self-pay

## 2024-08-09 ENCOUNTER — Ambulatory Visit: Payer: Self-pay | Admitting: Urgent Care

## 2024-08-09 ENCOUNTER — Ambulatory Visit (INDEPENDENT_AMBULATORY_CARE_PROVIDER_SITE_OTHER)

## 2024-08-09 ENCOUNTER — Ambulatory Visit
Admission: EM | Admit: 2024-08-09 | Discharge: 2024-08-09 | Disposition: A | Discharge status: Home / Self Care | Attending: Family Medicine | Admitting: Family Medicine

## 2024-08-09 DIAGNOSIS — R0602 Shortness of breath: Secondary | ICD-10-CM

## 2024-08-09 DIAGNOSIS — J329 Chronic sinusitis, unspecified: Secondary | ICD-10-CM | POA: Diagnosis not present

## 2024-08-09 DIAGNOSIS — J40 Bronchitis, not specified as acute or chronic: Secondary | ICD-10-CM | POA: Diagnosis not present

## 2024-08-09 MED ORDER — PREDNISONE 20 MG PO TABS: ORAL_TABLET | ORAL | 0 refills | Status: DC

## 2024-08-09 MED ORDER — PROMETHAZINE-DM 6.25-15 MG/5ML PO SYRP: 5.0000 mL | ORAL_SOLUTION | Freq: Three times a day (TID) | ORAL | 0 refills | Status: DC | PRN

## 2024-08-09 MED ORDER — CEFDINIR 300 MG PO CAPS: 300.0000 mg | ORAL_CAPSULE | Freq: Two times a day (BID) | ORAL | 0 refills | Status: DC

## 2024-08-09 NOTE — ED Triage Notes (Signed)
 Pt reports nasal congestion, chest congestion, headache, ears feels  clogged, shortness of breath, cough x 4 days.Left sided rib cage pain, worse when cough.  Mucinex , albuterol  gives some relief. Reports she had a upper

## 2024-08-09 NOTE — ED Provider Notes (Signed)
 [facility]  Note:  This document was prepared using Conservation officer, historic buildings and may include unintentional dictation errors.  MRN: [id number] DOB: 1981/02/05  Subjective:   Melissa Guerra is a 43 y.o. female presenting for recheck on 4-day history of recurrent coughing with associated left-sided chest pain, chest congestion, shortness of breath.  Patient has actually been symptomatic for about a month now.  Her symptoms improved some following the visit at the end of August.  However, her symptoms never fully resolved.  Has allergic rhinitis, history of sinus infections, asthma.  No diabetes.  No smoking of any kind including cigarettes, cigars, vaping, marijuana use.    No current facility-administered medications for this encounter.  Current Outpatient Medications:    dextromethorphan-guaiFENesin  (MUCINEX  DM) 30-600 MG 12hr tablet, Take 1 tablet by mouth 2 (two) times daily., Disp: , Rfl:    albuterol  (PROVENTIL ) (2.5 MG/3ML) 0.083% nebulizer solution, Take 3 mLs (2.5 mg total) by nebulization every 6 (six) hours as needed for wheezing or shortness of breath., Disp: 75 mL, Rfl: 0   albuterol  (VENTOLIN  HFA) 108 (90 Base) MCG/ACT inhaler, Inhale 2 puffs into the lungs every 6 (six) hours as needed for wheezing or shortness of breath., Disp: 8 g, Rfl: 0   amoxicillin -clavulanate (AUGMENTIN ) 875-125 MG tablet, Take 1 tablet by mouth every 12 (twelve) hours., Disp: , Rfl:    azithromycin  (ZITHROMAX ) 250 MG tablet, , Disp: , Rfl:    cefdinir  (OMNICEF ) 300 MG capsule, Take 1 capsule (300 mg total) by mouth 2 (two) times daily., Disp: 20 capsule, Rfl: 0   chlorhexidine (PERIDEX) 0.12 % solution, SMARTSIG:By Mouth, Disp: , Rfl:    Cholecalciferol 1.25 MG (50000 UT) capsule, TK 1 C PO ONCE WEEKLY, Disp: , Rfl:    ciclopirox  (PENLAC ) 8 % solution, Apply topically at bedtime. Apply over nail and surrounding skin. Apply daily over previous coat. After seven (7) days, may  remove with alcohol and continue cycle., Disp: 6.6 mL, Rfl: 0   clindamycin (CLEOCIN) 150 MG capsule, Take 150 mg by mouth 3 (three) times daily., Disp: , Rfl:    clindamycin (CLEOCIN) 300 MG capsule, Take 300 mg by mouth every 6 (six) hours., Disp: , Rfl:    cyclobenzaprine  (FLEXERIL ) 10 MG tablet, Take 1 tablet (10 mg total) by mouth 2 (two) times daily as needed for muscle spasms. Do not drink alcohol or drive while taking this medication.  May cause drowsiness., Disp: 20 tablet, Rfl: 0   dextromethorphan-guaiFENesin  (MUCINEX  DM) 30-600 MG 12hr tablet, Take 1 tablet by mouth 2 (two) times daily., Disp: 14 tablet, Rfl: 0   diazepam (VALIUM) 2 MG tablet, Take 2 mg by mouth 2 (two) times daily., Disp: , Rfl:    doxycycline  (VIBRAMYCIN ) 100 MG capsule, Take 1 capsule (100 mg total) by mouth 2 (two) times daily., Disp: 20 capsule, Rfl: 0   EPINEPHrine  (EPIPEN  2-PAK) 0.3 mg/0.3 mL IJ SOAJ injection, Inject 0.3 mg into the muscle as needed for anaphylaxis., Disp: 1 each, Rfl: 0   Ergocalciferol  (VITAMIN D2 PO), TK 1 C PO ONCE A WEEK, Disp: , Rfl:    ferrous gluconate  (FERGON) 324 MG tablet, Take 1 tablet (324 mg total) by mouth daily with breakfast., Disp: 90 tablet, Rfl: 0   fluconazole  (DIFLUCAN ) 150 MG tablet, Take 1 tablet (150 mg total) by mouth daily., Disp: 1 tablet, Rfl: 0   fluticasone  (FLONASE ) 50 MCG/ACT nasal spray, Place 2 sprays into both nostrils daily., Disp: 1  g, Rfl: 5   fluticasone  (FLONASE ) 50 MCG/ACT nasal spray, Place 2 sprays into both nostrils daily., Disp: 11.1 mL, Rfl: 2   hydrochlorothiazide  (HYDRODIURIL ) 25 MG tablet, Take 1 tablet (25 mg total) by mouth daily., Disp: 30 tablet, Rfl: 0   hydrOXYzine  (VISTARIL ) 25 MG capsule, Take 1 capsule (25 mg total) by mouth every 8 (eight) hours as needed., Disp: 60 capsule, Rfl: 2   ibuprofen  (ADVIL ) 800 MG tablet, Take 800 mg by mouth 3 (three) times daily., Disp: , Rfl:    ibuprofen  (ADVIL ,MOTRIN ) 200 MG tablet, Take 400 mg by mouth  every 6 (six) hours as needed for headache or mild pain., Disp: , Rfl:    levocetirizine (XYZAL ) 5 MG tablet, Take 1 tablet (5 mg total) by mouth every evening., Disp: 30 tablet, Rfl: 0   LORazepam (ATIVAN) 2 MG tablet, Take 2 mg by mouth 2 (two) times daily as needed., Disp: , Rfl:    meloxicam  (MOBIC ) 7.5 MG tablet, Take 7.5 mg by mouth daily., Disp: , Rfl:    methocarbamol  (ROBAXIN ) 500 MG tablet, Take 500 mg by mouth 3 (three) times daily., Disp: , Rfl:    methylPREDNISolone  (MEDROL  DOSEPAK) 4 MG TBPK tablet, Take 4 mg by mouth as directed., Disp: , Rfl:    montelukast  (SINGULAIR ) 10 MG tablet, Take 1 tablet (10 mg total) by mouth at bedtime., Disp: 90 tablet, Rfl: 3   OMEPRAZOLE  PO, Take 20 mg by mouth daily., Disp: , Rfl:    penicillin  v potassium (VEETID) 500 MG tablet, Take 500 mg by mouth 4 (four) times daily., Disp: , Rfl:    predniSONE  (DELTASONE ) 20 MG tablet, Take 2 tablets daily with breakfast., Disp: 10 tablet, Rfl: 0   promethazine -dextromethorphan (PROMETHAZINE -DM) 6.25-15 MG/5ML syrup, Take 5 mLs by mouth 3 (three) times daily as needed for cough., Disp: 200 mL, Rfl: 0   triamcinolone  cream (KENALOG ) 0.1 %, Apply 1 application topically 2 (two) times daily. Apply for 2 weeks. May use on face (Patient taking differently: Apply 1 application  topically 2 (two) times daily as needed. Apply for 2 weeks. May use on face), Disp: 30 g, Rfl: 0   UNABLE TO FIND, AIRIAL COMPRESSOR, Disp: , Rfl:    Vitamin D , Ergocalciferol , (DRISDOL ) 1.25 MG (50000 UNIT) CAPS capsule, Take 50,000 Units by mouth once a week., Disp: , Rfl:    Allergies  Allergen Reactions   Codeine Anaphylaxis and Other (See Comments)    Patient was hospitalized   Penicillins Hives and Nausea And Vomiting   Amoxicillin  Hives and Itching   Peanut Allergen Powder-Dnfp    Peanut-Containing Drug Products Hives, Itching and Other (See Comments)   Shellfish Allergy     Past Medical History:  Diagnosis Date   Abnormal Pap  smear    Acid reflux    Anemia    Anxiety    Arthritis    Asthma    Depression    Dyspnea    Obesity    Substance abuse (HCC)      Past Surgical History:  Procedure Laterality Date   FOOT SURGERY Bilateral    TONSILLECTOMY AND ADENOIDECTOMY     WISDOM TOOTH EXTRACTION      Family History  Problem Relation Age of Onset   Cancer Mother    HIV Mother    Diabetes Maternal Grandmother    Diabetes Paternal Grandmother    Heart failure Father     Social History   Tobacco Use   Smoking status: Former  Types: Cigarettes    Passive exposure: Past   Smokeless tobacco: Never  Vaping Use   Vaping status: Never Used  Substance Use Topics   Alcohol use: No   Drug use: No    ROS   Objective:   Vitals: BP (!) 142/84 (BP Location: Left Arm)   Pulse 82   Temp 98.2 F (36.8 C) (Oral)   Resp 20   LMP  (Within Months) Comment: 1 month  SpO2 98%   Physical Exam Constitutional:      General: She is not in acute distress.    Appearance: Normal appearance. She is well-developed. She is obese. She is not ill-appearing, toxic-appearing or diaphoretic.  HENT:     Head: Normocephalic and atraumatic.     Right Ear: Tympanic membrane, ear canal and external ear normal. No drainage or tenderness. No middle ear effusion. There is no impacted cerumen. Tympanic membrane is not erythematous or bulging.     Left Ear: Tympanic membrane, ear canal and external ear normal. No drainage or tenderness.  No middle ear effusion. There is no impacted cerumen. Tympanic membrane is not erythematous or bulging.     Nose: Congestion present. No rhinorrhea.     Mouth/Throat:     Mouth: Mucous membranes are moist. No oral lesions.     Pharynx: No pharyngeal swelling, oropharyngeal exudate, posterior oropharyngeal erythema or uvula swelling.     Tonsils: No tonsillar exudate or tonsillar abscesses. 0 on the right. 0 on the left.     Comments: Thick streaks of postnasal drainage overlying pharynx.   Eyes:     General: No scleral icterus.       Right eye: No discharge.        Left eye: No discharge.     Extraocular Movements: Extraocular movements intact.     Right eye: Normal extraocular motion.     Left eye: Normal extraocular motion.     Conjunctiva/sclera: Conjunctivae normal.  Cardiovascular:     Rate and Rhythm: Normal rate and regular rhythm.     Heart sounds: Normal heart sounds. No murmur heard.    No friction rub. No gallop.  Pulmonary:     Effort: Pulmonary effort is normal. No respiratory distress.     Breath sounds: No stridor. Examination of the right-middle field reveals rhonchi. Examination of the left-middle field reveals rhonchi. Rhonchi (trace) present. No decreased breath sounds, wheezing or rales.  Chest:     Chest wall: No tenderness.  Musculoskeletal:     Cervical back: Normal range of motion and neck supple.  Lymphadenopathy:     Cervical: No cervical adenopathy.  Skin:    General: Skin is warm and dry.  Neurological:     General: No focal deficit present.     Mental Status: She is alert and oriented to person, place, and time.  Psychiatric:        Mood and Affect: Mood normal.        Behavior: Behavior normal.     Assessment and Plan :   PDMP not reviewed this encounter.  1. Sinobronchitis   2. Shortness of breath    Will manage for sinobronchitis with cefdinir , prednisone .  Chest x-ray pending, will make adjustments based off of the overread.  Use supportive care otherwise.  Maintain allergic rhinitis and asthma treatments. Counseled patient on potential for adverse effects with medications prescribed/recommended today, ER and return-to-clinic precautions discussed, patient verbalized understanding.    Christopher Savannah, [place] 08/09/24 1127

## 2024-08-09 NOTE — Discharge Instructions (Addendum)
 I am managing you for sinobronchitis with cefdinir  and a steroid.  Please continue to use your breathing treatments.  You can use Tylenol and cough syrup as needed.  Keep taking your levocetirizine and Singulair  as well for your allergies and sinuses.  I will update you later with your chest x-ray results once I get the report.

## 2024-12-01 ENCOUNTER — Ambulatory Visit
Admission: RE | Admit: 2024-12-01 | Discharge: 2024-12-01 | Disposition: A | Discharge status: Home / Self Care | Payer: Self-pay | Source: Ambulatory Visit | Attending: Family Medicine | Admitting: Family Medicine

## 2024-12-01 ENCOUNTER — Other Ambulatory Visit: Payer: Self-pay

## 2024-12-01 VITALS — BP 131/89 | HR 72 | Temp 98.1°F | Resp 20

## 2024-12-01 DIAGNOSIS — J4521 Mild intermittent asthma with (acute) exacerbation: Secondary | ICD-10-CM | POA: Diagnosis not present

## 2024-12-01 DIAGNOSIS — R0602 Shortness of breath: Secondary | ICD-10-CM | POA: Diagnosis not present

## 2024-12-01 DIAGNOSIS — R051 Acute cough: Secondary | ICD-10-CM | POA: Diagnosis not present

## 2024-12-01 DIAGNOSIS — J069 Acute upper respiratory infection, unspecified: Secondary | ICD-10-CM

## 2024-12-01 LAB — POC COVID19/FLU A&B COMBO
Covid Antigen, POC: NEGATIVE
Influenza A Antigen, POC: NEGATIVE
Influenza B Antigen, POC: NEGATIVE

## 2024-12-01 MED ORDER — PREDNISONE 20 MG PO TABS: 40.0000 mg | ORAL_TABLET | Freq: Every day | ORAL | 0 refills | Status: AC

## 2024-12-01 MED ORDER — IPRATROPIUM-ALBUTEROL 0.5-2.5 (3) MG/3ML IN SOLN
3.0000 mL | Freq: Once | RESPIRATORY_TRACT | Status: AC
  Administered 2024-12-01: 3 mL via RESPIRATORY_TRACT

## 2024-12-01 MED ORDER — ALBUTEROL SULFATE HFA 108 (90 BASE) MCG/ACT IN AERS: 1.0000 | INHALATION_SPRAY | Freq: Four times a day (QID) | RESPIRATORY_TRACT | 0 refills | Status: AC | PRN

## 2024-12-01 NOTE — ED Triage Notes (Signed)
 Pt has had a sore throat for 3 days with congestion . Apt also has ear pressure. Pt reports she has asthma and is having problems breathing. Pt has been using her Nebulizer and still feels SHOB.

## 2024-12-01 NOTE — Discharge Instructions (Addendum)
 You tested negative for COVID and flu.  I refilled your albuterol  inhaler to use as needed for wheezing or shortness of breath.  Start prednisone  daily for 5 days still with your asthma symptoms.  Lots of rest and fluids.  Follow-up with your PCP in 2 to 3 days for recheck.  Please go to the emergency room for any worsening symptoms.  Hope you feel better soon!

## 2024-12-01 NOTE — ED Provider Notes (Signed)
 " [facility]    CSN: [id number] Arrival date & time: 12/01/24  9167      History   Chief Complaint Chief Complaint  Patient presents with   Sore Throat    Headache congestion ears feel stuffy sides hurt - Entered by patient    HPI Melissa Guerra is a 44 y.o. female  presents for evaluation of URI symptoms for 3 days. Patient reports associated symptoms of cough, congestion, sore throat, ear pain, shortness of breath. Denies N/V/D, fevers, body aches. Patient does have a hx of asthma.  Has been using her inhaler and nebulizer with temporary improvement.  Patient is not an active smoker.   Reports no known sick contacts.  Pt has taken Mucinex  OTC for symptoms. Pt has no other concerns at this time.    Sore Throat Associated symptoms include shortness of breath.    Past Medical History:  Diagnosis Date   Abnormal Pap smear    Acid reflux    Anemia    Anxiety    Arthritis    Asthma    Depression    Dyspnea    Obesity    Substance abuse (HCC)     Patient Active Problem List   Diagnosis Date Noted   Pain in joint involving ankle and foot 04/14/2024   Acute cough 11/06/2023   Other acute sinusitis 11/06/2023   Adjustment disorder with mixed anxiety and depressed mood 04/23/2023   Allergic rhinitis due to pollen 04/23/2023   Major depressive disorder, recurrent, moderate (HCC) 04/23/2023   Other iron deficiency anemias 04/23/2023   Cramp and spasm 03/26/2023   Elevated blood-pressure reading, without diagnosis of hypertension 03/26/2023   Localized edema 03/26/2023   Other fatigue 03/26/2023   Persons encountering health services in other specified circumstances 03/26/2023   Uterine polyp 12/19/2021   Mild intermittent asthma without complication 09/13/2021   COVID-19 05/15/2021   Class 3 severe obesity due to excess calories without serious comorbidity with body mass index (BMI) of 45.0 to 49.9 in adult (HCC) 03/19/2021   Exposure to COVID-19 virus  09/28/2020   Acute non-recurrent frontal sinusitis 09/28/2020   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 Authorizing Provider  albuterol  (PROVENTIL ) (2.5 MG/3ML) 0.083% nebulizer solution Take 3 mLs (2.5 mg total) by nebulization every 6 (six) hours as needed for wheezing or shortness of breath. 07/17/24  Yes Christopher Savannah, PA-C  albuterol  (VENTOLIN  HFA) 108 (90 Base) MCG/ACT inhaler Inhale 1-2 puffs into the lungs every 6 (six) hours as needed. 12/01/24  Yes Fontaine Hehl, Jodi R, NP  fluticasone  (FLONASE ) 50 MCG/ACT nasal spray Place 2 sprays into both nostrils daily. 08/19/22  Yes Lynwood Lenis, PA-C  hydrOXYzine  (VISTARIL ) 25 MG capsule Take  1 capsule (25 mg total) by mouth every 8 (eight) hours as needed. 02/06/22  Yes Myrna Camelia HERO, NP  levocetirizine (XYZAL ) 5 MG tablet Take 1 tablet (5 mg total) by mouth every evening. 07/17/24  Yes Christopher Savannah, PA-C  montelukast  (SINGULAIR ) 10 MG tablet Take 1 tablet (10 mg total) by mouth at bedtime. 10/08/21 12/01/24 Yes Myrna Camelia HERO, NP  predniSONE  (DELTASONE ) 20 MG tablet Take 2 tablets (40 mg total) by mouth daily with breakfast for 5 days. 12/01/24 12/06/24 Yes Nera Haworth, Jodi R, NP  EPINEPHrine  (EPIPEN  2-PAK) 0.3 mg/0.3 mL IJ SOAJ injection Inject 0.3 mg into the muscle as needed for anaphylaxis. 02/06/22   Myrna Camelia HERO, NP  fluconazole  (DIFLUCAN ) 150 MG tablet Take 1 tablet (150 mg total) by mouth daily. 02/26/23   Macarius Ruark, Jodi R, NP  fluticasone  (FLONASE ) 50 MCG/ACT nasal spray Place 2 sprays into both nostrils daily.  10/23/20   Myrna Camelia HERO, NP  hydrochlorothiazide  (HYDRODIURIL ) 25 MG tablet Take 1 tablet (25 mg total) by mouth daily. 03/23/23   Zammit, Joseph, MD  triamcinolone  cream (KENALOG ) 0.1 % Apply 1 application topically 2 (two) times daily. Apply for 2 weeks. May use on face Patient taking differently: Apply 1 application  topically 2 (two) times daily as needed. Apply for 2 weeks. May use on face 06/21/21   Myrna Camelia HERO, NP  UNABLE TO LARA COLT COMPRESSOR 04/14/24   [provider]    Family History Family History  Problem Relation Age of Onset   Cancer Mother    HIV Mother    Diabetes Maternal Grandmother    Diabetes Paternal Grandmother    Heart failure Father     Social History Social History[1]   Allergies   Codeine, Penicillins, Amoxicillin , Peanut allergen powder-dnfp, Peanut-containing drug products, and Shellfish allergy   Review of Systems Review of Systems  HENT:  Positive for congestion, ear pain and sore throat.   Respiratory:  Positive for cough, shortness of breath and wheezing.   Musculoskeletal:  Positive for myalgias.     Physical Exam Triage Vital Signs ED Triage Vitals  Encounter Vitals Group     BP 12/01/24 0901 131/89     Girls Systolic BP Percentile --      Girls Diastolic BP Percentile --      Boys Systolic BP Percentile --      Boys Diastolic BP Percentile --      Pulse Rate 12/01/24 0901 72     Resp 12/01/24 0901 20     Temp 12/01/24 0901 98.1 F (36.7 C)     Temp src --      SpO2 12/01/24 0901 98 %     Weight --      Height --      Head Circumference --      Peak Flow --      Pain Score 12/01/24 0851 6     Pain Loc --      Pain Education --      Exclude from Growth Chart --    No data found.  Updated Vital Signs BP 131/89   Pulse 72   Temp 98.1 F (36.7 C)   Resp 20   LMP 11/23/2024   SpO2 98%   Visual Acuity Right Eye Distance:   Left Eye Distance:   Bilateral Distance:    Right Eye Near:   Left Eye Near:     Bilateral Near:     Physical Exam Vitals and nursing  note reviewed.  Constitutional:      General: She is not in acute distress.    Appearance: She is well-developed. She is not ill-appearing.  HENT:     Head: Normocephalic and atraumatic.     Right Ear: Tympanic membrane and ear canal normal.     Left Ear: Tympanic membrane and ear canal normal.     Nose: Congestion present.     Mouth/Throat:     Mouth: Mucous membranes are moist.     Pharynx: Oropharynx is clear. Uvula midline. Posterior oropharyngeal erythema present.     Tonsils: No tonsillar exudate or tonsillar abscesses.  Eyes:     Conjunctiva/sclera: Conjunctivae normal.     Pupils: Pupils are equal, round, and reactive to light.  Cardiovascular:     Rate and Rhythm: Normal rate and regular rhythm.     Heart sounds: Normal heart sounds.  Pulmonary:     Effort: Pulmonary effort is normal.     Breath sounds: Normal breath sounds. No wheezing, rhonchi or rales.  Musculoskeletal:     Cervical back: Normal range of motion and neck supple.  Lymphadenopathy:     Cervical: No cervical adenopathy.  Skin:    General: Skin is warm and dry.  Neurological:     General: No focal deficit present.     Mental Status: She is alert and oriented to person, place, and time.  Psychiatric:        Mood and Affect: Mood normal.        Behavior: Behavior normal.      UC Treatments / Results  Labs (all labs ordered are listed, but only abnormal results are displayed) Labs Reviewed  POC COVID19/FLU A&B COMBO    EKG   Radiology No results found.  Procedures Procedures (including critical care time)  Medications Ordered in UC Medications  ipratropium-albuterol  (DUONEB) 0.5-2.5 (3) MG/3ML nebulizer solution 3 mL (3 mLs Nebulization Given 12/01/24 0924)    Initial Impression / Assessment and Plan / UC Course  I have reviewed the triage vital signs and the nursing notes.  Pertinent labs & imaging results that were available  during my care of the patient were reviewed by me and considered in my medical decision making (see chart for details).     Pt given nebulizer for SHOB, lungs remain CTAB after nebulizer.  Negative COVID and flu.  Discussed viral illness and asthma exacerbation.  Refill albuterol  inhaler start prednisone  daily for 5 days.  Advised rest fluids and PCP follow-up 2 to 3 days for recheck.  ER precautions reviewed. Final Clinical Impressions(s) / UC Diagnoses   Final diagnoses:  Acute cough  Shortness of breath  Viral upper respiratory illness  Mild intermittent asthma with acute exacerbation     Discharge Instructions      You tested negative for COVID and flu.  I refilled your albuterol  inhaler to use as needed for wheezing or shortness of breath.  Start prednisone  daily for 5 days still with your asthma symptoms.  Lots of rest and fluids.  Follow-up with your PCP in 2 to 3 days for recheck.  Please go to the emergency room for any worsening symptoms.  Hope you feel better soon!     ED Prescriptions     Medication Sig Dispense Auth. Provider   predniSONE  (DELTASONE ) 20 MG tablet Take 2 tablets (40 mg total) by mouth daily with breakfast for 5 days. 10 tablet Elyanah Farino, Jodi R, NP   albuterol  (VENTOLIN  HFA) 108 (90 Base)  MCG/ACT inhaler Inhale 1-2 puffs into the lungs every 6 (six) hours as needed. 1 each Loreda Myla SAUNDERS, NP      PDMP not reviewed this encounter.     [1]  Social History Tobacco Use   Smoking status: Former    Types: Cigarettes    Passive exposure: Past   Smokeless tobacco: Never  Vaping Use   Vaping status: Never Used  Substance Use Topics   Alcohol use: No   Drug use: No     Loreda Myla SAUNDERS, NP 12/01/24 [phone]  "

## 2024-12-15 ENCOUNTER — Telehealth: Payer: Self-pay | Admitting: Neurology

## 2024-12-15 NOTE — Telephone Encounter (Signed)
 Received sleep referral from [facility], places in sleep referrals box
